# Patient Record
Sex: Female | Born: 1965 | Race: Black or African American | Hispanic: No | Marital: Married | Smoking: Current some day smoker
Health system: Southern US, Community
[De-identification: ages and names within clinical notes are randomized; demographics above are authoritative.]

## PROBLEM LIST (undated history)

## (undated) DIAGNOSIS — R000 Tachycardia, unspecified: Secondary | ICD-10-CM

## (undated) DIAGNOSIS — N6019 Diffuse cystic mastopathy of unspecified breast: Secondary | ICD-10-CM

## (undated) DIAGNOSIS — A599 Trichomoniasis, unspecified: Secondary | ICD-10-CM

## (undated) DIAGNOSIS — E119 Type 2 diabetes mellitus without complications: Secondary | ICD-10-CM

## (undated) DIAGNOSIS — IMO0002 Reserved for concepts with insufficient information to code with codable children: Secondary | ICD-10-CM

## (undated) DIAGNOSIS — G709 Myoneural disorder, unspecified: Secondary | ICD-10-CM

## (undated) DIAGNOSIS — R6882 Decreased libido: Secondary | ICD-10-CM

## (undated) DIAGNOSIS — G43909 Migraine, unspecified, not intractable, without status migrainosus: Secondary | ICD-10-CM

## (undated) DIAGNOSIS — F32A Depression, unspecified: Secondary | ICD-10-CM

## (undated) DIAGNOSIS — H3553 Other dystrophies primarily involving the sensory retina: Secondary | ICD-10-CM

## (undated) DIAGNOSIS — T7840XA Allergy, unspecified, initial encounter: Secondary | ICD-10-CM

## (undated) DIAGNOSIS — M797 Fibromyalgia: Secondary | ICD-10-CM

## (undated) DIAGNOSIS — J45909 Unspecified asthma, uncomplicated: Secondary | ICD-10-CM

## (undated) DIAGNOSIS — K219 Gastro-esophageal reflux disease without esophagitis: Secondary | ICD-10-CM

## (undated) DIAGNOSIS — M1990 Unspecified osteoarthritis, unspecified site: Secondary | ICD-10-CM

## (undated) DIAGNOSIS — F329 Major depressive disorder, single episode, unspecified: Secondary | ICD-10-CM

## (undated) DIAGNOSIS — D649 Anemia, unspecified: Secondary | ICD-10-CM

## (undated) DIAGNOSIS — I10 Essential (primary) hypertension: Secondary | ICD-10-CM

## (undated) DIAGNOSIS — N83209 Unspecified ovarian cyst, unspecified side: Secondary | ICD-10-CM

## (undated) DIAGNOSIS — E559 Vitamin D deficiency, unspecified: Secondary | ICD-10-CM

## (undated) DIAGNOSIS — N760 Acute vaginitis: Secondary | ICD-10-CM

## (undated) HISTORY — DX: Type 2 diabetes mellitus without complications: E11.9

## (undated) HISTORY — PX: TONSILLECTOMY AND ADENOIDECTOMY: SUR1326

## (undated) HISTORY — DX: Fibromyalgia: M79.7

## (undated) HISTORY — DX: Allergy, unspecified, initial encounter: T78.40XA

## (undated) HISTORY — DX: Acute vaginitis: N76.0

## (undated) HISTORY — DX: Diffuse cystic mastopathy of unspecified breast: N60.19

## (undated) HISTORY — DX: Tachycardia, unspecified: R00.0

## (undated) HISTORY — DX: Reserved for concepts with insufficient information to code with codable children: IMO0002

## (undated) HISTORY — DX: Gastro-esophageal reflux disease without esophagitis: K21.9

## (undated) HISTORY — DX: Migraine, unspecified, not intractable, without status migrainosus: G43.909

## (undated) HISTORY — PX: DILATION AND CURETTAGE OF UTERUS: SHX78

## (undated) HISTORY — PX: OTHER SURGICAL HISTORY: SHX169

## (undated) HISTORY — DX: Trichomoniasis, unspecified: A59.9

## (undated) HISTORY — PX: MYOMECTOMY: SHX85

## (undated) HISTORY — PX: BREAST EXCISIONAL BIOPSY: SUR124

## (undated) HISTORY — DX: Myoneural disorder, unspecified: G70.9

## (undated) HISTORY — DX: Decreased libido: R68.82

## (undated) HISTORY — DX: Essential (primary) hypertension: I10

## (undated) HISTORY — DX: Anemia, unspecified: D64.9

## (undated) HISTORY — DX: Vitamin D deficiency, unspecified: E55.9

## (undated) HISTORY — PX: WISDOM TOOTH EXTRACTION: SHX21

## (undated) HISTORY — DX: Unspecified ovarian cyst, unspecified side: N83.209

---

## 1984-02-13 DIAGNOSIS — H3553 Other dystrophies primarily involving the sensory retina: Secondary | ICD-10-CM

## 1984-02-13 HISTORY — PX: BREAST SURGERY: SHX581

## 1984-02-13 HISTORY — DX: Other dystrophies primarily involving the sensory retina: H35.53

## 1998-02-12 HISTORY — PX: TUBAL LIGATION: SHX77

## 1998-02-12 HISTORY — PX: OOPHORECTOMY: SHX86

## 2000-02-13 DIAGNOSIS — J45909 Unspecified asthma, uncomplicated: Secondary | ICD-10-CM

## 2000-02-13 HISTORY — DX: Unspecified asthma, uncomplicated: J45.909

## 2000-08-10 ENCOUNTER — Encounter: Payer: Self-pay | Admitting: Family Medicine

## 2000-08-10 ENCOUNTER — Ambulatory Visit (HOSPITAL_COMMUNITY): Admission: RE | Admit: 2000-08-10 | Discharge: 2000-08-10 | Payer: Self-pay

## 2000-08-25 ENCOUNTER — Emergency Department (HOSPITAL_COMMUNITY): Admission: EM | Admit: 2000-08-25 | Discharge: 2000-08-25 | Payer: Self-pay

## 2000-10-08 ENCOUNTER — Other Ambulatory Visit: Admission: RE | Admit: 2000-10-08 | Discharge: 2000-10-08 | Payer: Self-pay | Admitting: Obstetrics and Gynecology

## 2001-05-20 ENCOUNTER — Other Ambulatory Visit: Admission: RE | Admit: 2001-05-20 | Discharge: 2001-05-20 | Payer: Self-pay | Admitting: Obstetrics and Gynecology

## 2001-07-22 ENCOUNTER — Emergency Department (HOSPITAL_COMMUNITY): Admission: EM | Admit: 2001-07-22 | Discharge: 2001-07-22 | Payer: Self-pay | Admitting: Emergency Medicine

## 2001-07-23 ENCOUNTER — Emergency Department (HOSPITAL_COMMUNITY): Admission: EM | Admit: 2001-07-23 | Discharge: 2001-07-23 | Payer: Self-pay | Admitting: Emergency Medicine

## 2002-04-25 ENCOUNTER — Emergency Department (HOSPITAL_COMMUNITY): Admission: EM | Admit: 2002-04-25 | Discharge: 2002-04-25 | Payer: Self-pay

## 2002-04-28 ENCOUNTER — Encounter: Payer: Self-pay | Admitting: Emergency Medicine

## 2002-04-28 ENCOUNTER — Emergency Department (HOSPITAL_COMMUNITY): Admission: EM | Admit: 2002-04-28 | Discharge: 2002-04-28 | Payer: Self-pay | Admitting: Emergency Medicine

## 2002-12-16 ENCOUNTER — Other Ambulatory Visit: Admission: RE | Admit: 2002-12-16 | Discharge: 2002-12-16 | Payer: Self-pay | Admitting: Obstetrics and Gynecology

## 2002-12-17 ENCOUNTER — Encounter: Admission: RE | Admit: 2002-12-17 | Discharge: 2002-12-17 | Payer: Self-pay | Admitting: Obstetrics and Gynecology

## 2003-02-13 DIAGNOSIS — N6019 Diffuse cystic mastopathy of unspecified breast: Secondary | ICD-10-CM

## 2003-02-13 HISTORY — DX: Diffuse cystic mastopathy of unspecified breast: N60.19

## 2004-02-13 HISTORY — PX: SHOULDER ARTHROSCOPY W/ ROTATOR CUFF REPAIR: SHX2400

## 2004-07-21 ENCOUNTER — Emergency Department (HOSPITAL_COMMUNITY): Admission: EM | Admit: 2004-07-21 | Discharge: 2004-07-21 | Payer: Self-pay | Admitting: Family Medicine

## 2004-10-24 ENCOUNTER — Emergency Department (HOSPITAL_COMMUNITY): Admission: EM | Admit: 2004-10-24 | Discharge: 2004-10-24 | Payer: Self-pay | Admitting: Emergency Medicine

## 2005-02-12 DIAGNOSIS — IMO0002 Reserved for concepts with insufficient information to code with codable children: Secondary | ICD-10-CM

## 2005-02-12 DIAGNOSIS — R87612 Low grade squamous intraepithelial lesion on cytologic smear of cervix (LGSIL): Secondary | ICD-10-CM

## 2005-02-12 HISTORY — DX: Low grade squamous intraepithelial lesion on cytologic smear of cervix (LGSIL): R87.612

## 2005-02-12 HISTORY — DX: Reserved for concepts with insufficient information to code with codable children: IMO0002

## 2005-02-26 ENCOUNTER — Encounter (INDEPENDENT_AMBULATORY_CARE_PROVIDER_SITE_OTHER): Payer: Self-pay | Admitting: *Deleted

## 2005-02-26 ENCOUNTER — Encounter: Admission: RE | Admit: 2005-02-26 | Discharge: 2005-02-26 | Payer: Self-pay | Admitting: Obstetrics and Gynecology

## 2005-03-19 ENCOUNTER — Other Ambulatory Visit: Admission: RE | Admit: 2005-03-19 | Discharge: 2005-03-19 | Payer: Self-pay | Admitting: Obstetrics and Gynecology

## 2005-03-27 ENCOUNTER — Encounter: Admission: RE | Admit: 2005-03-27 | Discharge: 2005-03-27 | Payer: Self-pay | Admitting: Internal Medicine

## 2005-08-27 ENCOUNTER — Encounter: Admission: RE | Admit: 2005-08-27 | Discharge: 2005-08-27 | Payer: Self-pay | Admitting: Obstetrics and Gynecology

## 2005-11-10 ENCOUNTER — Emergency Department (HOSPITAL_COMMUNITY): Admission: EM | Admit: 2005-11-10 | Discharge: 2005-11-10 | Payer: Self-pay | Admitting: Family Medicine

## 2006-02-12 DIAGNOSIS — N760 Acute vaginitis: Secondary | ICD-10-CM

## 2006-02-12 HISTORY — DX: Acute vaginitis: N76.0

## 2006-02-27 ENCOUNTER — Encounter: Admission: RE | Admit: 2006-02-27 | Discharge: 2006-02-27 | Payer: Self-pay | Admitting: Obstetrics and Gynecology

## 2006-11-05 ENCOUNTER — Encounter: Admission: RE | Admit: 2006-11-05 | Discharge: 2006-11-05 | Payer: Self-pay | Admitting: Internal Medicine

## 2007-03-17 ENCOUNTER — Emergency Department (HOSPITAL_COMMUNITY): Admission: EM | Admit: 2007-03-17 | Discharge: 2007-03-17 | Payer: Self-pay | Admitting: Emergency Medicine

## 2007-06-20 ENCOUNTER — Encounter: Admission: RE | Admit: 2007-06-20 | Discharge: 2007-06-20 | Payer: Self-pay | Admitting: Obstetrics and Gynecology

## 2007-09-27 ENCOUNTER — Emergency Department (HOSPITAL_COMMUNITY): Admission: EM | Admit: 2007-09-27 | Discharge: 2007-09-27 | Payer: Self-pay | Admitting: Family Medicine

## 2008-02-13 DIAGNOSIS — R6882 Decreased libido: Secondary | ICD-10-CM

## 2008-02-13 HISTORY — DX: Decreased libido: R68.82

## 2009-01-04 ENCOUNTER — Encounter: Admission: RE | Admit: 2009-01-04 | Discharge: 2009-01-04 | Payer: Self-pay | Admitting: Obstetrics and Gynecology

## 2009-12-01 LAB — HM DIABETES EYE EXAM

## 2010-02-22 ENCOUNTER — Encounter
Admission: RE | Admit: 2010-02-22 | Discharge: 2010-02-22 | Payer: Self-pay | Source: Home / Self Care | Attending: Obstetrics and Gynecology | Admitting: Obstetrics and Gynecology

## 2010-03-15 ENCOUNTER — Encounter: Payer: Self-pay | Admitting: Obstetrics and Gynecology

## 2010-08-03 ENCOUNTER — Other Ambulatory Visit: Payer: Self-pay | Admitting: Obstetrics and Gynecology

## 2010-08-03 DIAGNOSIS — N6310 Unspecified lump in the right breast, unspecified quadrant: Secondary | ICD-10-CM

## 2010-08-03 DIAGNOSIS — N6320 Unspecified lump in the left breast, unspecified quadrant: Secondary | ICD-10-CM

## 2010-08-10 ENCOUNTER — Ambulatory Visit
Admission: RE | Admit: 2010-08-10 | Discharge: 2010-08-10 | Disposition: A | Discharge status: Home / Self Care | Payer: Medicare Other | Source: Ambulatory Visit | Attending: Obstetrics and Gynecology | Admitting: Obstetrics and Gynecology

## 2010-08-10 ENCOUNTER — Other Ambulatory Visit: Payer: Self-pay | Admitting: Obstetrics and Gynecology

## 2010-08-10 DIAGNOSIS — N6310 Unspecified lump in the right breast, unspecified quadrant: Secondary | ICD-10-CM

## 2010-08-10 DIAGNOSIS — N6320 Unspecified lump in the left breast, unspecified quadrant: Secondary | ICD-10-CM

## 2010-09-14 ENCOUNTER — Other Ambulatory Visit: Payer: Self-pay | Admitting: Obstetrics and Gynecology

## 2010-09-15 ENCOUNTER — Encounter (HOSPITAL_COMMUNITY): Payer: Self-pay | Admitting: *Deleted

## 2010-09-20 NOTE — H&P (Signed)
NAMEMarland Hardin  ARNELLE, NALE NO.:  [id number]  MEDICAL RECORD NO.:  [id number]  LOCATION:  PERIO                         FACILITY:  [facility]  PHYSICIAN:  Janine Limbo, M.D.DATE OF BIRTH:  January 23, 1966  DATE OF ADMISSION:  09/21/2010 DATE OF DISCHARGE:                             HISTORY & PHYSICAL   HISTORY OF PRESENT ILLNESS:  Ms. Karen Hardin is a 45 year old female, para 3-0-0-3, who presents for hysteroscopy with resection of an endometrial polyp.  She will also have a dilatation and curettage.  The patient has been followed at the [facility].  The patient has a known history of fibroids.  She also has menorrhagia and endometrial polyps.  A hydrosonogram was performed that showed a least 7 fibroids with the largest measuring 3.39 cm.  She was also found to have a submucosal mass measuring 2.57 cm.  This was felt to be most consistent with endometrial polyp.  Her endometrial biopsy was negative.  The patient has had a diagnostic laparoscopy in the past which showed endometriosis.  She has had a bilateral tubal ligation.  PAST MEDICAL HISTORY:  The patient has had a breast cyst removed.  She has had laparoscopy.  She has had ganglion cyst removed from her wrist. She has had surgery on her eye.  She had a myomectomy in [place].  DRUG ALLERGIES:  No known drug allergies.  SOCIAL HISTORY:  The patient smokes cigarettes.  OBSTETRICAL HISTORY:  The patient has had 3 term vaginal deliveries.  REVIEW OF SYSTEMS:  See history of present illness.  FAMILY HISTORY:  Noncontributory.  PHYSICAL EXAMINATION:  VITAL SIGNS:  Weight is 163 pounds. HEENT:  Within normal limits. CHEST:  Clear. HEART: Regular rate and rhythm. BREASTS:  Without masses. ABDOMEN:  Nontender. EXTREMITIES:  Grossly normal. NEUROLOGIC:  Grossly normal. PELVIC:  External genitalia is normal.  Vagina is normal.  Cervix  is nontender.  Uterus is 12-week size.  Adnexa, no masses and rectovaginal exam confirms.  ASSESSMENT: 1. Irregular uterine bleeding. 2. Fibroids. 3. Endometrial polyps. 4. Prior myomectomy.  PLAN:  The patient will undergo hysteroscopy with resection of the endometrial polyp.  She will then have a dilatation and curettage.  The patient understands the indications for her surgical procedure and she accepts the risks of, but not limited to, anesthetic complications, bleeding,infections, and possible damage to the surrounding organs.  Janine Limbo, M.D.     AVS/MEDQ  D:  09/20/2010  T:  09/20/2010  Job:  [id number]

## 2010-09-21 ENCOUNTER — Encounter (HOSPITAL_COMMUNITY): Payer: Self-pay | Admitting: Anesthesiology

## 2010-09-21 ENCOUNTER — Encounter (HOSPITAL_COMMUNITY)
Admission: RE | Disposition: A | Discharge status: Home / Self Care | Payer: Self-pay | Source: Ambulatory Visit | Attending: Obstetrics and Gynecology

## 2010-09-21 ENCOUNTER — Ambulatory Visit (HOSPITAL_COMMUNITY)
Admission: RE | Admit: 2010-09-21 | Discharge: 2010-09-21 | Disposition: A | Discharge status: Home / Self Care | Payer: Medicare Other | Source: Ambulatory Visit | Attending: Obstetrics and Gynecology | Admitting: Obstetrics and Gynecology

## 2010-09-21 ENCOUNTER — Ambulatory Visit (HOSPITAL_COMMUNITY): Payer: Medicare Other | Admitting: Anesthesiology

## 2010-09-21 ENCOUNTER — Other Ambulatory Visit: Payer: Self-pay | Admitting: Obstetrics and Gynecology

## 2010-09-21 ENCOUNTER — Encounter (HOSPITAL_COMMUNITY): Payer: Self-pay | Admitting: Obstetrics and Gynecology

## 2010-09-21 DIAGNOSIS — N840 Polyp of corpus uteri: Secondary | ICD-10-CM | POA: Insufficient documentation

## 2010-09-21 DIAGNOSIS — N920 Excessive and frequent menstruation with regular cycle: Secondary | ICD-10-CM | POA: Insufficient documentation

## 2010-09-21 DIAGNOSIS — D250 Submucous leiomyoma of uterus: Secondary | ICD-10-CM | POA: Insufficient documentation

## 2010-09-21 DIAGNOSIS — Z8742 Personal history of other diseases of the female genital tract: Secondary | ICD-10-CM

## 2010-09-21 HISTORY — DX: Depression, unspecified: F32.A

## 2010-09-21 HISTORY — DX: Unspecified osteoarthritis, unspecified site: M19.90

## 2010-09-21 HISTORY — DX: Major depressive disorder, single episode, unspecified: F32.9

## 2010-09-21 LAB — CBC
HCT: 38.3 % (ref 36.0–46.0)
Hemoglobin: 12.3 g/dL (ref 12.0–15.0)
MCHC: 32.1 g/dL (ref 30.0–36.0)
MCV: 86.8 fL (ref 78.0–100.0)
RDW: 14.6 % (ref 11.5–15.5)
WBC: 6.7 10*3/uL (ref 4.0–10.5)

## 2010-09-21 SURGERY — DILATATION & CURETTAGE/HYSTEROSCOPY WITH RESECTOCOPE
Anesthesia: General | Site: Uterus | Wound class: Clean Contaminated

## 2010-09-21 MED ORDER — DEXAMETHASONE SODIUM PHOSPHATE 10 MG/ML IJ SOLN
INTRAMUSCULAR | Status: AC
  Filled 2010-09-21: qty 1

## 2010-09-21 MED ORDER — BUPIVACAINE-EPINEPHRINE 0.5% -1:200000 IJ SOLN
INTRAMUSCULAR | Status: DC | PRN
  Administered 2010-09-21: 10 mL

## 2010-09-21 MED ORDER — PROPOFOL 10 MG/ML IV EMUL
INTRAVENOUS | Status: DC | PRN
  Administered 2010-09-21: 150 mg via INTRAVENOUS

## 2010-09-21 MED ORDER — KETOROLAC TROMETHAMINE 30 MG/ML IJ SOLN: 15.0000 mg | Freq: Once | INTRAMUSCULAR | Status: DC | PRN

## 2010-09-21 MED ORDER — OXYCODONE-ACETAMINOPHEN 5-325 MG PO TABS: 1.0000 | ORAL_TABLET | ORAL | Status: AC | PRN

## 2010-09-21 MED ORDER — LACTATED RINGERS IV SOLN
INTRAVENOUS | Status: DC
  Administered 2010-09-21 (×4): via INTRAVENOUS

## 2010-09-21 MED ORDER — FENTANYL CITRATE 0.05 MG/ML IJ SOLN
25.0000 ug | INTRAMUSCULAR | Status: DC | PRN
  Administered 2010-09-21: 50 ug via INTRAVENOUS

## 2010-09-21 MED ORDER — FAMOTIDINE 20 MG PO TABS: 20.0000 mg | ORAL_TABLET | Freq: Once | ORAL | Status: DC | PRN

## 2010-09-21 MED ORDER — PROMETHAZINE HCL 12.5 MG PO TABS: 12.5000 mg | ORAL_TABLET | Freq: Four times a day (QID) | ORAL | Status: AC | PRN

## 2010-09-21 MED ORDER — FENTANYL CITRATE 0.05 MG/ML IJ SOLN
INTRAMUSCULAR | Status: DC | PRN
  Administered 2010-09-21 (×5): 50 ug via INTRAVENOUS

## 2010-09-21 MED ORDER — MIDAZOLAM HCL 2 MG/2ML IJ SOLN
INTRAMUSCULAR | Status: AC
  Filled 2010-09-21: qty 2

## 2010-09-21 MED ORDER — PANTOPRAZOLE SODIUM 40 MG PO TBEC: 40.0000 mg | DELAYED_RELEASE_TABLET | Freq: Once | ORAL | Status: DC | PRN

## 2010-09-21 MED ORDER — KETOROLAC TROMETHAMINE 60 MG/2ML IM SOLN
INTRAMUSCULAR | Status: AC
  Filled 2010-09-21: qty 2

## 2010-09-21 MED ORDER — ACETAMINOPHEN 325 MG PO TABS: 325.0000 mg | ORAL_TABLET | ORAL | Status: DC | PRN

## 2010-09-21 MED ORDER — PHENYLEPHRINE HCL 10 MG/ML IJ SOLN
INTRAMUSCULAR | Status: DC | PRN
  Administered 2010-09-21: 80 ug via INTRAVENOUS

## 2010-09-21 MED ORDER — DEXAMETHASONE SODIUM PHOSPHATE 4 MG/ML IJ SOLN
INTRAMUSCULAR | Status: DC | PRN
  Administered 2010-09-21: 4 mg via INTRAVENOUS

## 2010-09-21 MED ORDER — PROMETHAZINE HCL 25 MG/ML IJ SOLN: 6.2500 mg | INTRAMUSCULAR | Status: DC | PRN

## 2010-09-21 MED ORDER — PROMETHAZINE HCL 12.5 MG PO TABS: 12.5000 mg | ORAL_TABLET | Freq: Four times a day (QID) | ORAL | Status: DC | PRN

## 2010-09-21 MED ORDER — LIDOCAINE HCL (CARDIAC) 20 MG/ML IV SOLN
INTRAVENOUS | Status: AC
  Filled 2010-09-21: qty 5

## 2010-09-21 MED ORDER — MIDAZOLAM HCL 5 MG/5ML IJ SOLN
INTRAMUSCULAR | Status: DC | PRN
  Administered 2010-09-21: 2 mg via INTRAVENOUS

## 2010-09-21 MED ORDER — ONDANSETRON HCL 4 MG/2ML IJ SOLN
INTRAMUSCULAR | Status: AC
  Filled 2010-09-21: qty 2

## 2010-09-21 MED ORDER — LIDOCAINE HCL (CARDIAC) 20 MG/ML IV SOLN
INTRAVENOUS | Status: DC | PRN
  Administered 2010-09-21: 80 mg via INTRAVENOUS

## 2010-09-21 MED ORDER — GLYCINE 1.5 % IR SOLN
Status: DC | PRN
  Administered 2010-09-21: 3000 mL

## 2010-09-21 MED ORDER — FENTANYL CITRATE 0.05 MG/ML IJ SOLN
INTRAMUSCULAR | Status: AC
  Filled 2010-09-21: qty 5

## 2010-09-21 MED ORDER — PROPOFOL 10 MG/ML IV EMUL
INTRAVENOUS | Status: AC
  Filled 2010-09-21: qty 20

## 2010-09-21 MED ORDER — IBUPROFEN 200 MG PO TABS: 800.0000 mg | ORAL_TABLET | Freq: Three times a day (TID) | ORAL | Status: AC | PRN

## 2010-09-21 MED ORDER — FENTANYL CITRATE 0.05 MG/ML IJ SOLN
INTRAMUSCULAR | Status: AC
  Filled 2010-09-21: qty 2

## 2010-09-21 MED ORDER — KETOROLAC TROMETHAMINE 30 MG/ML IJ SOLN
INTRAMUSCULAR | Status: DC | PRN
  Administered 2010-09-21: 60 mg via INTRAVENOUS

## 2010-09-21 MED ORDER — SCOPOLAMINE 1 MG/3DAYS TD PT72: 1.0000 | MEDICATED_PATCH | Freq: Once | TRANSDERMAL | Status: DC | PRN

## 2010-09-21 MED ORDER — CITRIC ACID-SODIUM CITRATE 334-500 MG/5ML PO SOLN: 30.0000 mL | Freq: Once | ORAL | Status: DC | PRN

## 2010-09-21 MED ORDER — METOCLOPRAMIDE HCL 10 MG PO TABS: 10.0000 mg | ORAL_TABLET | Freq: Once | ORAL | Status: DC | PRN

## 2010-09-21 MED ORDER — ONDANSETRON HCL 4 MG/2ML IJ SOLN
INTRAMUSCULAR | Status: DC | PRN
  Administered 2010-09-21: 4 mg via INTRAVENOUS

## 2010-09-21 SURGICAL SUPPLY — 18 items
CANISTER SUCTION 2500CC (MISCELLANEOUS) ×2 IMPLANT
CATH ROBINSON RED A/P 16FR (CATHETERS) ×2 IMPLANT
CLOTH BEACON ORANGE TIMEOUT ST (SAFETY) ×2 IMPLANT
CONTAINER PREFILL 10% NBF 60ML (FORM) ×5 IMPLANT
DRAPE UTILITY XL STRL (DRAPES) ×2 IMPLANT
ELECT REM PT RETURN 9FT ADLT (ELECTROSURGICAL) ×2
ELECTRODE REM PT RTRN 9FT ADLT (ELECTROSURGICAL) IMPLANT
ELECTRODE ROLLER BARREL 22FR (ELECTROSURGICAL) IMPLANT
ELECTRODE VAPORCUT 22FR (ELECTROSURGICAL) IMPLANT
GLOVE BIOGEL PI IND STRL 8.5 (GLOVE) ×1 IMPLANT
GLOVE BIOGEL PI INDICATOR 8.5 (GLOVE) ×1
GLOVE ECLIPSE 8.0 STRL XLNG CF (GLOVE) ×4 IMPLANT
GOWN PREVENTION PLUS LG XLONG (DISPOSABLE) ×2 IMPLANT
GOWN PREVENTION PLUS XXLARGE (GOWN DISPOSABLE) ×2 IMPLANT
LOOP ANGLED CUTTING 22FR (CUTTING LOOP) ×1 IMPLANT
PACK HYSTEROSCOPY LF (CUSTOM PROCEDURE TRAY) ×2 IMPLANT
TOWEL OR 17X24 6PK STRL BLUE (TOWEL DISPOSABLE) ×4 IMPLANT
WATER STERILE IRR 1000ML POUR (IV SOLUTION) ×1 IMPLANT

## 2010-09-21 NOTE — H&P (Signed)
Patient examined.  Surgery reviewed. Questions answered. Ready to proceed.

## 2010-09-21 NOTE — Anesthesia Preprocedure Evaluation (Signed)
Anesthesia Evaluation  Name, MR# and DOB Patient awake  General Assessment Comment  Reviewed: Allergy & Precautions, H&P , Patient's Chart, lab work & pertinent test results, reviewed documented beta blocker date and time   History of Anesthesia Complications (+) PONV and AWARENESS UNDER ANESTHESIA  Airway Mallampati: II TM Distance: >3 FB Neck ROM: full    Dental No notable dental hx.    Pulmonary  clear to auscultation  pulmonary exam normalPulmonary Exam Normal breath sounds clear to auscultation none    Cardiovascular Exercise Tolerance: Good regular Normal    Neuro/Psych Negative Neurological ROS  Negative Psych ROS  GI/Hepatic/Renal negative GI ROS, negative Liver ROS, and negative Renal ROS (+)       Endo/Other  Negative Endocrine ROS (+)      Abdominal   Musculoskeletal   Hematology negative hematology ROS (+)   Peds  Reproductive/Obstetrics negative OB ROS    Anesthesia Other Findings             Anesthesia Physical Anesthesia Plan  ASA: I  Anesthesia Plan: General   Post-op Pain Management:    Induction:   Airway Management Planned:   Additional Equipment:   Intra-op Plan:   Post-operative Plan:   Informed Consent: I have reviewed the patients History and Physical, chart, labs and discussed the procedure including the risks, benefits and alternatives for the proposed anesthesia with the patient or authorized representative who has indicated his/her understanding and acceptance.   Dental Advisory Given  Plan Discussed with: CRNA and Surgeon  Anesthesia Plan Comments:         Anesthesia Quick Evaluation

## 2010-09-21 NOTE — Anesthesia Postprocedure Evaluation (Signed)
Anesthesia Post Note  Patient: Karen Hardin  Procedure(s) Performed:  DILATATION & CURETTAGE/HYSTEROSCOPY WITH RESECTOSCOPE  Anesthesia type: GA  Patient location: PACU  Post pain: Pain level controlled  Post assessment: Post-op Vital signs reviewed  Last Vitals:  Filed Vitals:   09/21/10 1130  BP: 113/72  Pulse: 83  Temp:   Resp: 16    Post vital signs: Reviewed  Level of consciousness: sedated  Complications: No apparent anesthesia complications

## 2010-09-21 NOTE — Transfer of Care (Signed)
Immediate Anesthesia Transfer of Care Note  Patient: Karen Hardin  Procedure(s) Performed:  DILATATION & CURETTAGE/HYSTEROSCOPY WITH RESECTOSCOPE  Patient Location: PACU  Anesthesia Type: General  Level of Consciousness: awake, alert , oriented and patient cooperative  Airway & Oxygen Therapy: Patient Spontanous Breathing and Patient connected to nasal cannula oxygen  Post-op Assessment: Report given to PACU RN, Post -op Vital signs reviewed and stable, Patient moving all extremities, Patient moving all extremities X 4 and Patient able to stick tongue midline  Post vital signs: Reviewed and stable  Complications: No apparent anesthesia complications

## 2010-09-21 NOTE — Preoperative (Signed)
Beta Blockers   Reason not to administer Beta Blockers:Not Applicable 

## 2010-09-21 NOTE — Op Note (Signed)
Note of operation  Karen Hardin  Medical records number: [id number]  Date of operation: 09/21/2010  Preoperative diagnosis:  Menorrhagia  Fibroids  Endometrial polyps  Postoperative diagnosis:  Menorrhagia  Fibroids  Endometrial polyps  Procedure:  Hysteroscopy  Resection of polyps  Dilatation and curettage  Surgeon:  Leonard Schwartz M.D.  First assistant:  None  Anesthesia:  General  Disposition:  This platelet is a 45 year old female, para 3-0-0-3, who presents with the above-mentioned diagnosis. The patient understands the indications for her procedure. She accepts the risk of, but not limited to, anesthetic complications, bleeding, infections, and possible damage to the surrounding organs.  Findings:  The uterus is 12 week size. No adnexal masses are appreciated. On hysteroscopy the patient was noted to have several endometrial polyps with the largest polyp measuring 1 cm. She was also noted to have a submucosal fibroid. No other pathology was appreciated.  Procedure:  The patient was taken to the operating room where a general anesthetic was given. The patient's perineum was prepped with multiple layers of Betadine. The bladder was drained of urine. The patient was sterilely draped. Examination under anesthesia was performed. A paracervical block was placed using Marcaine with epinephrine. An endocervical curettage was performed. The uterus sounded to 10 cm. The diagnostic hysteroscope was inserted. The cavity was carefully inspected. Pictures were taken. The cervix was gently dilated. The operative hysteroscope was then inserted. The interatrial polyp and a submucosal fibroids were resected without difficulty. Hemostasis was adequate. The cavity was then curetted until to be completely clean. The hysteroscope was reinserted. It was confirmed that the cavity was cleaned. On the sutures were removed. The patient's exam was repeated. The uterus was  noted to be firm. Hemostasis was adequate. The patient was returned to the supine position. She was awakened from her anesthetic without difficulty. She was transported to the recovery room in stable condition. Estimated fluid deficit was 30 cc. The estimated blood loss was 5 cc. The patient tolerated her procedure well. The endometrial curettings, endometrial resections, and the endocervical curettings were sent to pathology for evaluation.  Followup instructions:  The patient will return to see Dr. Stefano Gaul in 2 weeks for followup examination. She was given a copy of the postoperative instructions as prepared by the [facility] in [place]. She will refrain from intercourse for 2 weeks. She will call for questions or concerns.  Discharge medications:  Percocet 5/325 one or 2 tablets every 4 hours as needed for severe pain  Motrin 800 mg one tablet every 8 hours as needed for mild to moderate pain  Phenergan 12.5 mg one tablet every 6 hours as needed for nausea

## 2011-06-01 ENCOUNTER — Other Ambulatory Visit: Payer: Self-pay | Admitting: Family Medicine

## 2011-06-01 ENCOUNTER — Ambulatory Visit
Admission: RE | Admit: 2011-06-01 | Discharge: 2011-06-01 | Disposition: A | Discharge status: Home / Self Care | Payer: Medicare Other | Source: Ambulatory Visit | Attending: Family Medicine | Admitting: Family Medicine

## 2011-06-01 DIAGNOSIS — R7611 Nonspecific reaction to tuberculin skin test without active tuberculosis: Secondary | ICD-10-CM

## 2011-06-01 DIAGNOSIS — R52 Pain, unspecified: Secondary | ICD-10-CM

## 2011-08-03 ENCOUNTER — Telehealth: Payer: Self-pay | Admitting: Obstetrics and Gynecology

## 2011-08-06 ENCOUNTER — Telehealth: Payer: Self-pay

## 2011-08-06 NOTE — Telephone Encounter (Signed)
Triage/cht received 

## 2011-08-06 NOTE — Telephone Encounter (Signed)
Pt requests referral for diagnostic mammogram and is due for annual appointment. Scheduled aex with Dr Stefano Gaul for 08/22/11. Pt voiced understanding.

## 2011-08-22 ENCOUNTER — Ambulatory Visit: Payer: Self-pay | Admitting: Obstetrics and Gynecology

## 2011-09-11 ENCOUNTER — Encounter: Payer: Self-pay | Admitting: Obstetrics and Gynecology

## 2011-09-11 ENCOUNTER — Ambulatory Visit (INDEPENDENT_AMBULATORY_CARE_PROVIDER_SITE_OTHER): Payer: Medicare Other | Admitting: Obstetrics and Gynecology

## 2011-09-11 ENCOUNTER — Telehealth: Payer: Self-pay | Admitting: Obstetrics and Gynecology

## 2011-09-11 VITALS — BP 106/62 | Ht 68.0 in | Wt 170.0 lb

## 2011-09-11 DIAGNOSIS — N6019 Diffuse cystic mastopathy of unspecified breast: Secondary | ICD-10-CM

## 2011-09-11 DIAGNOSIS — D219 Benign neoplasm of connective and other soft tissue, unspecified: Secondary | ICD-10-CM

## 2011-09-11 DIAGNOSIS — Z01419 Encounter for gynecological examination (general) (routine) without abnormal findings: Secondary | ICD-10-CM

## 2011-09-11 DIAGNOSIS — D259 Leiomyoma of uterus, unspecified: Secondary | ICD-10-CM

## 2011-09-11 DIAGNOSIS — N920 Excessive and frequent menstruation with regular cycle: Secondary | ICD-10-CM

## 2011-09-11 DIAGNOSIS — Z124 Encounter for screening for malignant neoplasm of cervix: Secondary | ICD-10-CM

## 2011-09-11 NOTE — Progress Notes (Signed)
Last Pap:08/03/2010 WNL: Yes Regular Periods:no Contraception: BTL  Monthly Breast exam:yes Tetanus<87yrs:yes Nl.Bladder Function:yes Daily BMs:no Healthy Diet:no Calcium:yes Mammogram:yes Date of Mammogram: 08/10/2010 Exercise:no Have often Exercise:  Seatbelt: yes Abuse at home: no Stressful work:no Sigmoid-colonoscopy: none Bone Density: No PCP: Dr.Sanders  Change in PMH: none   Change in HQI:ONGE   Subjective:    Karen Hardin is a 46 y.o. female, G3P3003, who presents for an annual exam. See above. The patient has a known history of fibroids.  She complains of heavy bleeding.  In 2012 a hysteroscopy and D&C helped her bleeding for a short time.  The path report was benign.  The patient has had a prior myomectomy.  She has a past history of endometriosis.  She has had 1 ovary removed.  Prior Hysterectomy: No    History   Social History  . Marital Status: Married    Spouse Name: N/A    Number of Children: N/A  . Years of Education: N/A   Social History Main Topics  . Smoking status: Current Everyday Smoker -- 0.2 packs/day for 20 years    Types: Cigarettes  . Smokeless tobacco: Never Used  . Alcohol Use: Yes     very rarely - less than once a week  . Drug Use: No  . Sexually Active: Yes    Birth Control/ Protection: Surgical, Condom   Other Topics Concern  . None   Social History Narrative  . None    Menstrual cycle:   LMP: Patient's last menstrual period was 08/30/2011.           Cycle: heavy  The following portions of the patient's history were reviewed and updated as appropriate: allergies, current medications, past family history, past medical history, past social history, past surgical history and problem list.  Review of Systems Pertinent items are noted in HPI. Breast:Negative for breast lump,nipple discharge or nipple retraction Gastrointestinal: Negative for abdominal pain, change in bowel habits or rectal bleeding Urinary:negative     Objective:    BP 106/62  Ht 5\' 8"  (1.727 m)  Wt 170 lb (77.111 kg)  BMI 25.85 kg/m2  LMP 08/30/2011    Weight:  Wt Readings from Last 1 Encounters:  09/11/11 170 lb (77.111 kg)          BMI: Body mass index is 25.85 kg/(m^2).  General Appearance: Alert, appropriate appearance for age. No acute distress HEENT: Grossly normal Neck / Thyroid: Supple, no masses, nodes or enlargement Lungs: clear to auscultation bilaterally Back: No CVA tenderness Breast Exam: the patient has fibrocystic breast changes and there is a 1 cm cyst present in the right outer mid quadrant Cardiovascular: Regular rate and rhythm. S1, S2, no murmur Gastrointestinal: Soft, non-tender, no masses or organomegaly  ++++++++++++++++++++++++++++++++++++++++++++++++++++++++  Pelvic Exam: External genitalia: normal general appearance Vaginal: normal without tenderness, induration or masses and relaxation noted Cervix: normal appearance Adnexa: normal bimanual exam Uterus: 12 week size and irregular Rectovaginal: normal rectal, no masses  ++++++++++++++++++++++++++++++++++++++++++++++++++++++++  Lymphatic Exam: Non-palpable nodes in neck, clavicular, axillary, or inguinal regions Neurologic: Normal speech, no tremor  Psychiatric: Alert and oriented, appropriate affect.   Wet Prep:not applicable Urinalysis:not applicable UPT: Not done   Assessment:    Normal gyn exam   Fibroids  Menorrhagia  Right breast lesion  History of endometriosis  Status post myomectomy  Status post oophorectomy  Overweight or obese: Yes   Pelvic relaxation: Yes   Plan:    mammogram pap smear return annually or prn  Contraception:bilateral tubal ligation    STD screen request: none  RPR: No.   HBsAg: No.  Hepatitis C: No.  The updated Pap smear screening guidelines were discussed with the patient. The patient requested that I obtain a Pap smear: Yes.  Kegel exercises discussed: Yes.  Proper diet and  regular exercise were reviewed.  Annual mammograms recommended starting at age 38. Proper breast care was discussed.  Screening colonoscopy is recommended beginning at age 87.  Regular health maintenance was reviewed.  We discussed the management of menorrhagia and fibroids.  The patient was to proceed with hysterectomy.  We will schedule further discussion and a potential LAVH.  Mylinda Latina.D.

## 2011-09-12 LAB — PAP IG W/ RFLX HPV ASCU

## 2011-09-18 ENCOUNTER — Telehealth: Payer: Self-pay | Admitting: Obstetrics and Gynecology

## 2011-09-25 ENCOUNTER — Other Ambulatory Visit: Payer: Self-pay | Admitting: Obstetrics and Gynecology

## 2011-09-25 ENCOUNTER — Ambulatory Visit
Admission: RE | Admit: 2011-09-25 | Discharge: 2011-09-25 | Disposition: A | Discharge status: Home / Self Care | Payer: Medicare Other | Source: Ambulatory Visit | Attending: Obstetrics and Gynecology | Admitting: Obstetrics and Gynecology

## 2011-09-25 DIAGNOSIS — N6019 Diffuse cystic mastopathy of unspecified breast: Secondary | ICD-10-CM

## 2011-12-18 ENCOUNTER — Emergency Department (INDEPENDENT_AMBULATORY_CARE_PROVIDER_SITE_OTHER)
Admission: EM | Admit: 2011-12-18 | Discharge: 2011-12-18 | Disposition: A | Discharge status: Home / Self Care | Payer: Medicare Other | Source: Home / Self Care | Attending: Emergency Medicine | Admitting: Emergency Medicine

## 2011-12-18 ENCOUNTER — Encounter (HOSPITAL_COMMUNITY): Payer: Self-pay | Admitting: *Deleted

## 2011-12-18 DIAGNOSIS — IMO0002 Reserved for concepts with insufficient information to code with codable children: Secondary | ICD-10-CM

## 2011-12-18 DIAGNOSIS — M5416 Radiculopathy, lumbar region: Secondary | ICD-10-CM

## 2011-12-18 DIAGNOSIS — H3553 Other dystrophies primarily involving the sensory retina: Secondary | ICD-10-CM | POA: Insufficient documentation

## 2011-12-18 HISTORY — DX: Other dystrophies primarily involving the sensory retina: H35.53

## 2011-12-18 MED ORDER — HYDROCODONE-ACETAMINOPHEN 5-325 MG PO TABS: ORAL_TABLET | ORAL | Status: DC

## 2011-12-18 MED ORDER — TIZANIDINE HCL 4 MG PO TABS: 4.0000 mg | ORAL_TABLET | Freq: Four times a day (QID) | ORAL | Status: DC | PRN

## 2011-12-18 MED ORDER — KETOROLAC TROMETHAMINE 60 MG/2ML IM SOLN
60.0000 mg | Freq: Once | INTRAMUSCULAR | Status: AC
  Administered 2011-12-18: 60 mg via INTRAMUSCULAR

## 2011-12-18 MED ORDER — KETOROLAC TROMETHAMINE 60 MG/2ML IM SOLN
INTRAMUSCULAR | Status: AC
  Filled 2011-12-18: qty 2

## 2011-12-18 MED ORDER — PREDNISONE 10 MG PO TABS: ORAL_TABLET | ORAL | Status: DC

## 2011-12-18 MED ORDER — HYDROCODONE-ACETAMINOPHEN 5-325 MG PO TABS
1.0000 | ORAL_TABLET | Freq: Once | ORAL | Status: AC
  Administered 2011-12-18: 1 via ORAL

## 2011-12-18 MED ORDER — HYDROCODONE-ACETAMINOPHEN 5-325 MG PO TABS
ORAL_TABLET | ORAL | Status: AC
  Filled 2011-12-18: qty 1

## 2011-12-18 NOTE — ED Notes (Signed)
States she bent down to pick up a bottle and could not straighten up this afternoon.  She applied the heating pad and states the pain got worse and went into L groin.  States she gets a sharp pain the occasionally goes down the L leg to her foot.

## 2011-12-18 NOTE — ED Provider Notes (Signed)
Chief Complaint  Patient presents with  . Back Pain    History of Present Illness:   The patient is a 46 year old female who presents with a one-day history of lower back pain. This afternoon she bent over to pickup a bottle and she could not straighten up. She experienced sudden onset of severe lower back pain which radiated into the left buttock, left groin, and on down the left leg into the toes. She noticed some numbness and tingling in the foot but no muscle weakness. The pain is described as a constant severe ache rated 8/10 in intensity punctuated by sharp stabbing pain is rated 10 over 10 in intensity. The pain is worse with any kind of movement and not better with any other activities. She tried Tylenol and cyclobenzaprine without relief. She denies any muscle weakness, bladder symptoms, incontinence of urine or stool, or saddle anesthesia. She's had no abdominal pain, fever, chills, or unintended weight loss. She had something similar years ago which was diagnosed as a back strain. She never had any x-rays and this got better on its own.  Review of Systems:  Other than noted above, the patient denies any of the following symptoms: Systemic:  No fever, chills, severe fatigue, or unexplained weight loss. GI:  No abdominal pain, nausea, vomiting, diarrhea, constipation, incontinence of bowel, or blood in stool. GU:  No dysuria, frequency, urgency, or hematuria. No incontinence of urine or difficulty urinating.  M-S:  No neck pain, joint pain, arthritis, or myalgias. Neuro:  No paresthesias, saddle anesthesia, muscular weakness, or progressive neurological deficit.  PMFSH:  Past medical history, family history, social history, meds, and allergies were reviewed. Specifically, there is no history of cancer, major trauma, osteoporosis, immunosuppression, HIV, or IV or injection drug use.   Physical Exam:   Vital signs:  BP 145/81  Pulse 111  Temp 98.3 F (36.8 C) (Oral)  Resp 20  SpO2 99%   LMP 11/27/2011 General:  Alert, oriented, and she appears uncomfortable due to pain. Abdomen:  Soft, non-tender.  No organomegaly or mass.  No pulsatile midline abdominal mass or bruit. Back:  There is tenderness to palpation in the left lumbar paravertebral musculature. There was no tenderness over the midline or over the sacroiliac joint. The back has a limited range of motion with 10 of flexion, 10 of extension, 10 of lateral bending in each direction, and 60 of rotation with pain and muscle spasm in straight leg raising was positive on the left with a positive Lasegue's sign but negative popliteal compression. Neuro:  Normal muscle strength, sensations and DTRs. Extremities: Pedal pulses were full, there was no edema. Skin:  Clear, warm and dry.  No rash.  Course in Urgent Care Center:   The patient was given Toradol 60 mg IM and hydrocodone 5/325 by mouth and tolerated these well without any immediate side effects.  Assessment:  The encounter diagnosis was Lumbar radiculopathy.  Plan:   1.  The following meds were prescribed:   New Prescriptions   HYDROCODONE-ACETAMINOPHEN (NORCO/VICODIN) 5-325 MG PER TABLET    1 to 2 tabs every 4 to 6 hours as needed for pain.   PREDNISONE (DELTASONE) 10 MG TABLET    Take 4 tabs daily for 4 days, 3 tabs daily for 4 days, 2 tabs daily for 4 days, then 1 tab daily for 4 days.   TIZANIDINE (ZANAFLEX) 4 MG TABLET    Take 1 tablet (4 mg total) by mouth every 6 (six) hours as needed.  2.  The patient was instructed in symptomatic care and handouts were given. 3.  The patient was told to return if becoming worse in any way, if no better in 2 weeks, and given some red flag symptoms that would indicate earlier return. 4.  The patient was encouraged to try to be as active as possible and given some exercises to do followed by moist heat.  Follow up:  The patient was told to follow up with her primary care physician in 2 weeks.     Reuben Likes,  MD 12/18/11 [phone]

## 2012-02-01 ENCOUNTER — Encounter (HOSPITAL_COMMUNITY): Payer: Self-pay | Admitting: Emergency Medicine

## 2012-02-01 ENCOUNTER — Emergency Department (INDEPENDENT_AMBULATORY_CARE_PROVIDER_SITE_OTHER)
Admission: EM | Admit: 2012-02-01 | Discharge: 2012-02-01 | Disposition: A | Discharge status: Home / Self Care | Payer: Medicare Other | Source: Home / Self Care

## 2012-02-01 ENCOUNTER — Emergency Department (INDEPENDENT_AMBULATORY_CARE_PROVIDER_SITE_OTHER): Payer: Medicare Other

## 2012-02-01 DIAGNOSIS — R000 Tachycardia, unspecified: Secondary | ICD-10-CM

## 2012-02-01 DIAGNOSIS — J9801 Acute bronchospasm: Secondary | ICD-10-CM

## 2012-02-01 DIAGNOSIS — J40 Bronchitis, not specified as acute or chronic: Secondary | ICD-10-CM

## 2012-02-01 LAB — POCT RAPID STREP A: Streptococcus, Group A Screen (Direct): NEGATIVE

## 2012-02-01 MED ORDER — PHENYLEPHRINE-CHLORPHEN-DM 10-4-12.5 MG/5ML PO LIQD: 5.0000 mL | ORAL | Status: DC | PRN

## 2012-02-01 MED ORDER — ONDANSETRON HCL 4 MG PO TABS: 4.0000 mg | ORAL_TABLET | Freq: Four times a day (QID) | ORAL | Status: DC

## 2012-02-01 MED ORDER — ALBUTEROL SULFATE HFA 108 (90 BASE) MCG/ACT IN AERS: 2.0000 | INHALATION_SPRAY | RESPIRATORY_TRACT | Status: DC | PRN

## 2012-02-01 NOTE — ED Provider Notes (Signed)
History     CSN: [id number]  Arrival date & time 02/01/12  1408   None     Chief Complaint  Patient presents with  . URI    (Consider location/radiation/quality/duration/timing/severity/associated sxs/prior treatment) HPI Comments: 46-year-old female who presents with "a bad cough, bodyaches, anterior chest pain associated with coughing and breathing, weakness, chills. She denies any known fever. She states she initially had nausea so she has not been eating or drinking much. She has been eating ice chips and not maintain a good fluid intake.   Past Medical History  Diagnosis Date  . PONV (postoperative nausea and vomiting) 2002  . 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 Depression     as a child  . Fibroid   . H/O: menorrhagia 09/14/2010  . Endometrial polyp 09/14/2010  . Cyst, breast   . Irregular uterine bleeding 09/14/2010  . Headache     migraines  . Trichomonas   . Ovarian cyst   . ASCUS (atypical squamous cells of undetermined significance) on Pap smear   . Fibrocystic breast 2005  . LGSIL (low grade squamous intraepithelial lesion) on Pap smear 2007  . Vaginosis 2008  . Libido, decreased 2010  . 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 Shoulder arthroscopy w/ rotator cuff repair 2006  . Oophorectomy 2000  . Tubal ligation 2000  . Dilation and curettage of uterus   . Breast cyst  removed    . Breast surgery 1986    biopsy  . Ganglion cyst removed from wrist   . Myomectomy     In [place]    Family History  Problem Relation Age of Onset  . Arthritis Mother   . Diabetes Mother   . Cancer Father     lung cancer  . Hyperlipidemia Maternal Grandmother   . Breast cancer Maternal Grandmother     History  Substance Use Topics  . Smoking status: Current Every Day Smoker -- 0.2 packs/day for 20 years    Types: Cigarettes  . Smokeless tobacco: Never Used  . Alcohol Use: Yes     Comment: very rarely - less  than once a week    OB History    Grav Para Term Preterm Abortions TAB SAB Ect Mult Living   3 3 3       3       Review of Systems  Constitutional: Positive for chills, appetite change and fatigue. Negative for fever.       Fatigue malaise  HENT: Positive for congestion and postnasal drip. Negative for ear pain, sore throat, neck pain and neck stiffness.   Respiratory: Positive for cough and shortness of breath. Negative for chest tightness.   Cardiovascular: Positive for chest pain. Negative for leg swelling.  Gastrointestinal: Negative.   Genitourinary: Negative.   Skin: Negative.     Allergies  Review of patient's allergies indicates no known allergies.  Home Medications   Current Outpatient Rx  Name  Route  Sig  Dispense  Refill  . ACETAMINOPHEN 500 MG PO TABS   Oral   Take 1,000 mg by mouth every 6 (six) hours as needed.         . ALBUTEROL SULFATE HFA 108 (90 BASE) MCG/ACT IN AERS   Inhalation   Inhale 2 puffs into the lungs every 4 (four) hours as needed for wheezing. Dispense with aerochamber   1 Inhaler  0   . CYCLOBENZAPRINE HCL 10 MG PO TABS   Oral   Take 10 mg by mouth every 8 (eight) hours as needed.           Marland Kitchen HYDROCODONE-ACETAMINOPHEN 5-325 MG PO TABS      1 to 2 tabs every 4 to 6 hours as needed for pain.   20 tablet   0   . ONDANSETRON HCL 4 MG PO TABS   Oral   Take 1 tablet (4 mg total) by mouth every 6 (six) hours.   12 tablet   0   . PHENYLEPHRINE-CHLORPHEN-DM 11-16-10.5 MG/5ML PO LIQD   Oral   Take 5 mLs by mouth every 4 (four) hours as needed.   120 mL   0   . PREDNISONE 10 MG PO TABS      Take 4 tabs daily for 4 days, 3 tabs daily for 4 days, 2 tabs daily for 4 days, then 1 tab daily for 4 days.   40 tablet   0   . TIZANIDINE HCL 4 MG PO TABS   Oral   Take 1 tablet (4 mg total) by mouth every 6 (six) hours as needed.   30 tablet   0     BP 130/87  Pulse 149  Temp 98.2 F (36.8 C) (Oral)  Resp 16  SpO2 97%  LMP  02/01/2012  Physical Exam  Nursing note and vitals reviewed. Constitutional: She is oriented to person, place, and time. She appears well-developed and well-nourished. No distress.  HENT:       Bilateral TMs are normal Oropharynx with mild to moderate erythema but no exudates or swelling. Posterior pharynx with some cobblestoning to  Neck: Normal range of motion. Neck supple.  Cardiovascular: Regular rhythm.        Tachycardic with a heart rate of 149, apical pulse is bounding.  Pulmonary/Chest: Effort normal. No respiratory distress. She has wheezes. She has no rales. She exhibits tenderness.  Musculoskeletal: Normal range of motion. She exhibits no edema.  Lymphadenopathy:    She has no cervical adenopathy.  Neurological: She is alert and oriented to person, place, and time. She exhibits normal muscle tone.  Skin: Skin is warm and dry. No rash noted.  Psychiatric: She has a normal mood and affect.    ED Course  Procedures (including critical care time)   Labs Reviewed  POCT RAPID STREP A ([facility] CARE ONLY)   Dg Chest 2 View  02/01/2012  *RADIOLOGY REPORT*  Clinical Data: Chest pain, smoker  CHEST - 2 VIEW  Comparison: 06/01/2011  Findings: Normal heart size, mediastinal contours, and pulmonary vascularity. Bronchitic changes and hyperinflation with linear areas of scarring in the right upper lobe and lingula. No acute infiltrate, pleural effusion or pneumothorax. Bones unremarkable.  IMPRESSION: Bronchitic changes and hyperinflation with linear areas of scarring in both lungs. No acute abnormalities.   Original Report Authenticated By: Ulyses Southward, M.D.      1. Bronchitis   2. Bronchospasm   3. Tachycardia       MDM  Dg Chest 2 View  02/01/2012  *RADIOLOGY REPORT*  Clinical Data: Chest pain, smoker  CHEST - 2 VIEW  Comparison: 06/01/2011  Findings: Normal heart size, mediastinal contours, and pulmonary vascularity. Bronchitic changes and hyperinflation with linear areas of  scarring in the right upper lobe and lingula. No acute infiltrate, pleural effusion or pneumothorax. Bones unremarkable.  IMPRESSION: Bronchitic changes and hyperinflation with linear areas of scarring in both  lungs. No acute abnormalities.   Original Report Authenticated By: Ulyses Southward, M.D.    EKG: Sinus tachycardia no ischemic changes some nonspecific T-wave abnormalities, no ectopy. I suspect her tachycardia is due to dehydration she has had very poor fluid intake over the past 4 days. We will treat her with [age]-year-old HFA 2 puffs every 4-6 hours when necessary cough and wheeze Zofran 4 mg every 6 hours when necessary nausea or vomiting Norell CS 1 teaspoon every 4 hours when necessary cough and congestion Must drink plenty of fluids and stay well hydrated. Consider Gatorade, Sprite, clear liquids. Stay home rest for the next 3 days.           Hayden Rasmussen, NP 02/01/12 1626

## 2012-02-01 NOTE — ED Provider Notes (Signed)
Medical screening examination/treatment/procedure(s) were performed by resident physician or non-physician practitioner and as supervising physician I was immediately available for consultation/collaboration.   KINDL,JAMES DOUGLAS MD.    James D Kindl, MD 02/01/12 1957 

## 2012-02-01 NOTE — ED Notes (Signed)
Pt c/o cold sx x5 days... Sx include: dry cough, sore throat, chills, chest discomfort due to cough... Denies: fevers, diarrhea... Has tried nyquil and motrin w/some relief.. She is alert w/no signs of acute distress

## 2012-02-09 ENCOUNTER — Telehealth (HOSPITAL_COMMUNITY): Payer: Self-pay | Admitting: Emergency Medicine

## 2012-02-09 NOTE — ED Notes (Signed)
Patient called stating that cough medicine was not working.  Chart reviewed by Hayden Rasmussen NP.  He advised patient she needs to be re-evaluated given the length of time since visit.  Patient expressed understanding

## 2012-03-29 ENCOUNTER — Other Ambulatory Visit: Payer: Self-pay

## 2012-05-25 ENCOUNTER — Emergency Department (HOSPITAL_COMMUNITY)
Admission: EM | Admit: 2012-05-25 | Discharge: 2012-05-25 | Disposition: A | Discharge status: Home / Self Care | Payer: Medicare Other | Attending: Emergency Medicine | Admitting: Emergency Medicine

## 2012-05-25 ENCOUNTER — Encounter (HOSPITAL_COMMUNITY): Payer: Self-pay | Admitting: Family Medicine

## 2012-05-25 DIAGNOSIS — H3553 Other dystrophies primarily involving the sensory retina: Secondary | ICD-10-CM | POA: Insufficient documentation

## 2012-05-25 DIAGNOSIS — G43909 Migraine, unspecified, not intractable, without status migrainosus: Secondary | ICD-10-CM

## 2012-05-25 DIAGNOSIS — F17200 Nicotine dependence, unspecified, uncomplicated: Secondary | ICD-10-CM | POA: Insufficient documentation

## 2012-05-25 DIAGNOSIS — M1990 Unspecified osteoarthritis, unspecified site: Secondary | ICD-10-CM | POA: Insufficient documentation

## 2012-05-25 DIAGNOSIS — Z8659 Personal history of other mental and behavioral disorders: Secondary | ICD-10-CM | POA: Insufficient documentation

## 2012-05-25 DIAGNOSIS — Z8619 Personal history of other infectious and parasitic diseases: Secondary | ICD-10-CM | POA: Insufficient documentation

## 2012-05-25 DIAGNOSIS — Z79899 Other long term (current) drug therapy: Secondary | ICD-10-CM | POA: Insufficient documentation

## 2012-05-25 DIAGNOSIS — Z8742 Personal history of other diseases of the female genital tract: Secondary | ICD-10-CM | POA: Insufficient documentation

## 2012-05-25 MED ORDER — DIPHENHYDRAMINE HCL 50 MG/ML IJ SOLN
25.0000 mg | Freq: Once | INTRAMUSCULAR | Status: AC
  Administered 2012-05-25: 25 mg via INTRAVENOUS
  Filled 2012-05-25: qty 1

## 2012-05-25 MED ORDER — SODIUM CHLORIDE 0.9 % IV BOLUS (SEPSIS)
1000.0000 mL | Freq: Once | INTRAVENOUS | Status: AC
  Administered 2012-05-25: 1000 mL via INTRAVENOUS

## 2012-05-25 MED ORDER — KETOROLAC TROMETHAMINE 30 MG/ML IJ SOLN
30.0000 mg | Freq: Once | INTRAMUSCULAR | Status: AC
  Administered 2012-05-25: 30 mg via INTRAVENOUS
  Filled 2012-05-25: qty 1

## 2012-05-25 MED ORDER — METOCLOPRAMIDE HCL 5 MG/ML IJ SOLN
10.0000 mg | Freq: Once | INTRAMUSCULAR | Status: AC
  Administered 2012-05-25: 10 mg via INTRAVENOUS
  Filled 2012-05-25: qty 2

## 2012-05-25 NOTE — ED Provider Notes (Signed)
History     CSN: [id number]  Arrival date & time 05/25/12  1258   First MD Initiated Contact with Patient 05/25/12 1443      Chief Complaint  Patient presents with  . Migraine    (Consider location/radiation/quality/duration/timing/severity/associated sxs/prior treatment) HPI  47 year old female with history of migraine headache presents complaining of headache. Patient reports for the past 2 weeks she has had persistent pain to the right side of her head. Describe pain as a dull and achy sensation, persistent, with associated light and sound sensitivity, and nausea and vomiting. This symptom is minimally improved with Excedrin headache and Maxalt. She denies fever, chills, vision changes, sneezing, coughing, neck stiffness, abdominal pain, diarrhea , or rash. Headache feels similar to prior headaches. Her last major headache was a year ago. She is unsure what triggers this particular headache.  Past Medical History  Diagnosis Date  . PONV (postoperative nausea and vomiting) 2002  . 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 Depression     as a child  . Fibroid   . H/O: menorrhagia 09/14/2010  . Endometrial polyp 09/14/2010  . Cyst, breast   . Irregular uterine bleeding 09/14/2010  . Headache     migraines  . Trichomonas   . Ovarian cyst   . ASCUS (atypical squamous cells of undetermined significance) on Pap smear   . Fibrocystic breast 2005  . LGSIL (low grade squamous intraepithelial lesion) on Pap smear 2007  . Vaginosis 2008  . Libido, decreased 2010  . Stargardt's disease 1986    Patient is legally blind    Past Surgical History  Procedure Laterality Date  . Shoulder arthroscopy w/ rotator cuff repair  2006  . Oophorectomy  2000  . Tubal ligation  2000  . Dilation and curettage of uterus    . Breast cyst  removed     . Breast surgery  1986    biopsy  . Ganglion cyst removed from wrist    . [place]omectomy      In [place]    Family History  Problem Relation Age of Onset  .  Arthritis Mother   . Diabetes Mother   . Cancer Father     lung cancer  . Hyperlipidemia Maternal Grandmother   . Breast cancer Maternal Grandmother     History  Substance Use Topics  . Smoking status: Current Every Day Smoker -- 0.25 packs/day for 20 years    Types: Cigarettes  . Smokeless tobacco: Never Used  . Alcohol Use: Yes     Comment: very rarely - less than once a week    OB History   Grav Para Term Preterm Abortions TAB SAB Ect Mult Living   3 3 3       3       Review of Systems  Constitutional:       10 Systems reviewed and all are negative for acute change except as noted in the HPI.     Allergies  Review of patient's allergies indicates no known allergies.  Home Medications   Current Outpatient Rx  Name  Route  Sig  Dispense  Refill  . acetaminophen (TYLENOL) 500 MG tablet   Oral   Take 1,000 mg by mouth every 6 (six) hours as needed.         Marland Kitchen albuterol (PROVENTIL HFA;VENTOLIN HFA) 108 (90 BASE) MCG/ACT inhaler   Inhalation   Inhale 2 puffs into the lungs every 4 (four) hours as needed for  wheezing. Dispense with aerochamber   1 Inhaler   0   . cyclobenzaprine (FLEXERIL) 10 MG tablet   Oral   Take 10 mg by mouth every 8 (eight) hours as needed.           Marland Kitchen HYDROcodone-acetaminophen (NORCO/VICODIN) 5-325 MG per tablet      1 to 2 tabs every 4 to 6 hours as needed for pain.   20 tablet   0   . ondansetron (ZOFRAN) 4 MG tablet   Oral   Take 1 tablet (4 mg total) by mouth every 6 (six) hours.   12 tablet   0   . Phenylephrine-Chlorphen-DM 11-16-10.5 MG/5ML LIQD   Oral   Take 5 mLs by mouth every 4 (four) hours as needed.   120 mL   0   . predniSONE (DELTASONE) 10 MG tablet      Take 4 tabs daily for 4 days, 3 tabs daily for 4 days, 2 tabs daily for 4 days, then 1 tab daily for 4 days.   40 tablet   0   . tiZANidine (ZANAFLEX) 4 MG tablet   Oral   Take 1 tablet (4 mg total) by mouth every 6 (six) hours as needed.   30 tablet    0     BP 128/91  Pulse 83  Temp(Src) 98.3 F (36.8 C) (Oral)  Resp 22  SpO2 100%  LMP 05/11/2012  Physical Exam  Nursing note and vitals reviewed. Constitutional: She is oriented to person, place, and time. She appears well-developed and well-nourished. No distress.  HENT:  Head: Normocephalic and atraumatic.  Right Ear: External ear normal.  Left Ear: External ear normal.  Nose: Nose normal.  Mouth/Throat: Oropharynx is clear and moist. No oropharyngeal exudate.  Eyes: Conjunctivae and EOM are normal. Pupils are equal, round, and reactive to light.  Neck: Normal range of motion. Neck supple. No JVD present.  Abdominal: Soft. There is no tenderness.  Musculoskeletal: Normal range of motion. She exhibits no edema and no tenderness.  Lymphadenopathy:    She has no cervical adenopathy.  Neurological: She is alert and oriented to person, place, and time. She has normal strength. No cranial nerve deficit or sensory deficit. Coordination and gait normal. GCS eye subscore is 4. GCS verbal subscore is 5. GCS motor subscore is 6.  Skin: Skin is warm. No rash noted.  Psychiatric: She has a normal mood and affect.    ED Course  Procedures (including critical care time)  2:59 PM Patient presents with right-sided headache with prior history of migraine. She is afebrile with stable normal vital signs. No red flags. No sudden onset thunderclap to suggest subarachnoid hemorrhage, no focal neuro deficit.  Since headache in same location, which could suggest clusterred headache, will give migraine cocktail and supplemental O2.  Will continue to monitor.  Pt is mildly tachy, IVF given.   5:28 PM Patient reports feeling much better after receiving treatment. She is stable for discharge. She is encouraged to follow up with her primary care Dr. for further management. Strict return precautions discussed. All questions answered to patient's satisfaction. Labs Reviewed - No data to display No results  found.   1. Migraine       MDM  BP 114/67  Pulse 80  Temp(Src) 98.3 F (36.8 C) (Oral)  Resp 22  SpO2 97%  LMP 05/11/2012  I have reviewed nursing notes and vital signs.  I reviewed available ER/hospitalization records thought the EMR  Fayrene Helper, PA-C 05/25/12 1731

## 2012-05-25 NOTE — ED Notes (Signed)
Per pt sts 2 weeks of HA. sts hx of migraines. sts some light nausea.

## 2012-05-26 NOTE — ED Provider Notes (Signed)
Medical screening examination/treatment/procedure(s) were conducted as a shared visit with non-physician practitioner(s) and myself.  I personally evaluated the patient during the encounter.  No neuro def.  feels better IV fluids, pain management  Donnetta Hutching, MD 05/26/12 1524

## 2012-08-18 ENCOUNTER — Other Ambulatory Visit: Payer: Self-pay

## 2012-08-20 ENCOUNTER — Other Ambulatory Visit: Payer: Self-pay | Admitting: Obstetrics and Gynecology

## 2012-08-20 DIAGNOSIS — Z1231 Encounter for screening mammogram for malignant neoplasm of breast: Secondary | ICD-10-CM

## 2012-09-25 ENCOUNTER — Ambulatory Visit
Admission: RE | Admit: 2012-09-25 | Discharge: 2012-09-25 | Disposition: A | Discharge status: Home / Self Care | Payer: Medicare Other | Source: Ambulatory Visit | Attending: Obstetrics and Gynecology | Admitting: Obstetrics and Gynecology

## 2012-09-25 DIAGNOSIS — Z1231 Encounter for screening mammogram for malignant neoplasm of breast: Secondary | ICD-10-CM

## 2012-09-26 ENCOUNTER — Other Ambulatory Visit: Payer: Self-pay | Admitting: Obstetrics and Gynecology

## 2012-09-26 DIAGNOSIS — N63 Unspecified lump in unspecified breast: Secondary | ICD-10-CM

## 2012-10-17 ENCOUNTER — Ambulatory Visit
Admission: RE | Admit: 2012-10-17 | Discharge: 2012-10-17 | Disposition: A | Discharge status: Home / Self Care | Payer: Medicare Other | Source: Ambulatory Visit | Attending: Obstetrics and Gynecology | Admitting: Obstetrics and Gynecology

## 2012-10-17 ENCOUNTER — Other Ambulatory Visit: Payer: Self-pay | Admitting: Obstetrics and Gynecology

## 2012-10-17 DIAGNOSIS — N63 Unspecified lump in unspecified breast: Secondary | ICD-10-CM

## 2012-10-23 ENCOUNTER — Ambulatory Visit
Admission: RE | Admit: 2012-10-23 | Discharge: 2012-10-23 | Disposition: A | Discharge status: Home / Self Care | Payer: Medicare Other | Source: Ambulatory Visit | Attending: Obstetrics and Gynecology | Admitting: Obstetrics and Gynecology

## 2012-10-23 DIAGNOSIS — N63 Unspecified lump in unspecified breast: Secondary | ICD-10-CM

## 2012-11-18 ENCOUNTER — Other Ambulatory Visit: Payer: Self-pay | Admitting: Obstetrics and Gynecology

## 2012-11-27 ENCOUNTER — Other Ambulatory Visit: Payer: Self-pay | Admitting: Obstetrics and Gynecology

## 2012-11-29 NOTE — Addendum Note (Signed)
Addended by: Janine Limbo on: 11/29/2012 06:02 AM   Modules accepted: Orders

## 2012-12-08 ENCOUNTER — Encounter (HOSPITAL_COMMUNITY): Payer: Self-pay | Admitting: Pharmacist

## 2012-12-18 ENCOUNTER — Other Ambulatory Visit: Payer: Self-pay

## 2012-12-19 ENCOUNTER — Encounter (HOSPITAL_COMMUNITY): Payer: Self-pay

## 2012-12-19 ENCOUNTER — Encounter (HOSPITAL_COMMUNITY)
Admission: RE | Admit: 2012-12-19 | Discharge: 2012-12-19 | Disposition: A | Discharge status: Home / Self Care | Payer: Medicare Other | Source: Ambulatory Visit | Attending: Obstetrics and Gynecology | Admitting: Obstetrics and Gynecology

## 2012-12-19 DIAGNOSIS — Z01810 Encounter for preprocedural cardiovascular examination: Secondary | ICD-10-CM | POA: Insufficient documentation

## 2012-12-19 DIAGNOSIS — Z01812 Encounter for preprocedural laboratory examination: Secondary | ICD-10-CM | POA: Insufficient documentation

## 2012-12-19 DIAGNOSIS — Z01818 Encounter for other preprocedural examination: Secondary | ICD-10-CM | POA: Insufficient documentation

## 2012-12-19 LAB — CBC
Hemoglobin: 11.2 g/dL — ABNORMAL LOW (ref 12.0–15.0)
MCH: 28.4 pg (ref 26.0–34.0)
RBC: 3.94 MIL/uL (ref 3.87–5.11)
WBC: 6.5 10*3/uL (ref 4.0–10.5)

## 2012-12-19 LAB — BASIC METABOLIC PANEL
GFR calc Af Amer: >90 mL/min (ref >90)
GFR calc non Af Amer: >90 mL/min (ref >90)
Glucose, Bld: 87 mg/dL (ref 70–99)
Potassium: 3.8 mEq/L (ref 3.5–5.1)
Sodium: 139 mEq/L (ref 135–145)

## 2012-12-19 NOTE — Patient Instructions (Signed)
20 Karen Hardin  12/19/2012   Your procedure is scheduled on:  December 26, 2012  Enter through the [employer] [facility] at 0800 AM.  Pick up the phone at the desk and dial [phone].   Call this number if you have problems the morning of surgery: [phone]   Remember:   Do not eat food:After Midnight.  Do not drink clear liquids: After Midnight.  Take these medicines the morning of surgery with A SIP OF WATER: Bring albuterol inhaler day of surgery.   Do not wear jewelry, make-up or nail polish.  Do not wear lotions, powders, or perfumes. You may wear deodorant.  Do not shave 48 hours prior to surgery.  Do not bring valuables to the hospital.  [facility] is not   responsible for any belongings or valuables brought to the hospital.  Contacts, dentures or bridgework may not be worn into surgery.  Leave suitcase in the car. After surgery it may be brought to your room.  For patients admitted to the hospital, checkout time is 11:00 AM the day of              discharge.   Patients discharged the day of surgery will not be allowed to drive             home.  Name and phone number of your driver: husband  Special Instructions:   Shower using CHG 2 nights before surgery and the night before surgery.  If you shower the day of surgery use CHG.  Use special wash - you have one bottle of CHG for all showers.  You should use approximately 1/3 of the bottle for each shower.   Please read over the following fact sheets that you were given:   Getting ready for surgery.

## 2012-12-22 NOTE — H&P (Addendum)
Admission History and Physical Exam for a Gynecology Patient  Karen Hardin is a 47 y.o. female, [phone], who presents for a laparoscopy assisted vaginal hysterectomy. The patient complains of menorrhagia. She has a known history of anemia. Her past surgical history includes a myomectomy, laparoscopic tubal cautery, diagnostic laparoscopy with resection of endometriosis, and hysteroscopy with resection of a submucosal fibroid. Her endometrial biopsy is benign. Her most recent hemoglobin was 11.2. Her most recent Pap smear was within normal limits. Gonorrhea and Chlamydia cultures are negative. An ultrasound showed a multi-fibroid uterus. The endometrium measured 4.86 cm. The left ovary has been surgically removed. The right ovary appeared normal. She has been followed at the [facility].  OB History   Grav Para Term Preterm Abortions TAB SAB Ect Mult Living   3 3 3       3       Past Medical History  Diagnosis Date  . PONV (postoperative nausea and vomiting) 2002  . 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 Fibroid   . H/O: menorrhagia 09/14/2010  . Endometrial polyp 09/14/2010  . Cyst, breast   . Irregular uterine bleeding 09/14/2010  . Headache(784.0)     migraines  . Trichomonas   . Ovarian cyst   . ASCUS (atypical squamous cells of undetermined significance) on Pap smear   . Fibrocystic breast 2005  . LGSIL (low grade squamous intraepithelial lesion) on Pap smear 2007  . Vaginosis 2008  . 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 Depression     as a child, and stress recently  . Libido, decreased 2010    No prescriptions prior to admission    Past Surgical History  Procedure Laterality Date  . Shoulder arthroscopy w/ rotator cuff repair  2006  . Oophorectomy  2000  . Tubal ligation  2000  . Dilation and curettage of uterus    . Breast cyst  removed     . Breast surgery  1986    biopsy  .  Ganglion cyst removed from wrist    . Myomectomy      In [place]    No Known Allergies  Family History: family history includes Arthritis in her mother; Breast cancer in her maternal grandmother; Cancer in her father; Diabetes in her mother; Hyperlipidemia in her maternal grandmother.  Social History:  reports that she quit smoking about 2 weeks ago. Her smoking use included Cigarettes. She has a 5 pack-year smoking history. She has never used smokeless tobacco. She reports that she drinks alcohol. She reports that she does not use illicit drugs.  Review of systems: See HPI.  Admission Physical Exam:    There is no weight on file to calculate BMI.  There were no vitals taken for this visit.  HEENT:                 Within normal limits Chest:                   Clear Heart:                    Regular rate and rhythm Breasts:                No masses, skin changes, bleeding, or discharge present Abdomen:             Nontender, no masses Extremities:  Grossly normal Neurologic exam: Grossly normal  Pelvic exam:  External genitalia: normal general appearance Vaginal: normal without tenderness, induration or masses Cervix: normal appearance Adnexa: normal bimanual exam Uterus: enlarged Rectal: no masses  CBC    Component Value Date/Time   WBC 6.5 12/19/2012 1115   RBC 3.94 12/19/2012 1115   HGB 11.2* 12/19/2012 1115   HCT 33.8* 12/19/2012 1115   PLT 291 12/19/2012 1115   MCV 85.8 12/19/2012 1115   MCH 28.4 12/19/2012 1115   MCHC 33.1 12/19/2012 1115   RDW 16.2* 12/19/2012 1115    Assessment:  12-14 week size fibroid uterus  Menorrhagia  Endometriosis  Anemia  History of depression  Prior myomectomy  Stargardt's Disease (legally blind)  Overweight  Plan:  The patient will undergo a laparoscopy assisted vaginal hysterectomy. She understands the indications for surgical procedure as well as the alternative treatment options. She accepts the risk of, but  not limited to, anesthetic complications, bleeding, infections, and possible damage to the surrounding organs. She understands that there is a possibility that there may be dense pelvic adhesions and that an open laparotomy may need to be performed.   Janine Limbo 12/22/2012

## 2012-12-25 ENCOUNTER — Telehealth: Payer: Self-pay | Admitting: Obstetrics and Gynecology

## 2012-12-25 MED ORDER — CEFAZOLIN SODIUM-DEXTROSE 2-3 GM-% IV SOLR
2.0000 g | INTRAVENOUS | Status: AC
  Administered 2012-12-26: 2 g via INTRAVENOUS

## 2012-12-26 ENCOUNTER — Ambulatory Visit (HOSPITAL_COMMUNITY)
Admission: RE | Admit: 2012-12-26 | Discharge: 2012-12-27 | Disposition: A | Discharge status: Home / Self Care | Payer: Medicare Other | Source: Ambulatory Visit | Attending: Obstetrics and Gynecology | Admitting: Obstetrics and Gynecology

## 2012-12-26 ENCOUNTER — Ambulatory Visit (HOSPITAL_COMMUNITY): Payer: Medicare Other | Admitting: Anesthesiology

## 2012-12-26 ENCOUNTER — Encounter (HOSPITAL_COMMUNITY): Payer: Medicare Other | Admitting: Anesthesiology

## 2012-12-26 ENCOUNTER — Encounter (HOSPITAL_COMMUNITY): Payer: Self-pay

## 2012-12-26 ENCOUNTER — Encounter (HOSPITAL_COMMUNITY)
Admission: RE | Disposition: A | Discharge status: Home / Self Care | Payer: Self-pay | Source: Ambulatory Visit | Attending: Obstetrics and Gynecology

## 2012-12-26 DIAGNOSIS — E669 Obesity, unspecified: Secondary | ICD-10-CM | POA: Insufficient documentation

## 2012-12-26 DIAGNOSIS — D252 Subserosal leiomyoma of uterus: Secondary | ICD-10-CM | POA: Insufficient documentation

## 2012-12-26 DIAGNOSIS — Z23 Encounter for immunization: Secondary | ICD-10-CM | POA: Insufficient documentation

## 2012-12-26 DIAGNOSIS — H3553 Other dystrophies primarily involving the sensory retina: Secondary | ICD-10-CM | POA: Insufficient documentation

## 2012-12-26 DIAGNOSIS — N920 Excessive and frequent menstruation with regular cycle: Secondary | ICD-10-CM | POA: Insufficient documentation

## 2012-12-26 DIAGNOSIS — Z87891 Personal history of nicotine dependence: Secondary | ICD-10-CM | POA: Insufficient documentation

## 2012-12-26 DIAGNOSIS — N8000 Endometriosis of the uterus, unspecified: Secondary | ICD-10-CM | POA: Insufficient documentation

## 2012-12-26 DIAGNOSIS — D649 Anemia, unspecified: Secondary | ICD-10-CM | POA: Insufficient documentation

## 2012-12-26 DIAGNOSIS — D251 Intramural leiomyoma of uterus: Secondary | ICD-10-CM | POA: Insufficient documentation

## 2012-12-26 DIAGNOSIS — I10 Essential (primary) hypertension: Secondary | ICD-10-CM | POA: Insufficient documentation

## 2012-12-26 HISTORY — DX: Unspecified asthma, uncomplicated: J45.909

## 2012-12-26 HISTORY — PX: LAPAROSCOPIC ASSISTED VAGINAL HYSTERECTOMY: SHX5398

## 2012-12-26 HISTORY — PX: CYSTOSCOPY: SHX5120

## 2012-12-26 LAB — PREGNANCY, URINE: Preg Test, Ur: NEGATIVE

## 2012-12-26 SURGERY — HYSTERECTOMY, VAGINAL, LAPAROSCOPY-ASSISTED
Anesthesia: General | Site: Urethra | Wound class: Clean Contaminated

## 2012-12-26 MED ORDER — FENTANYL CITRATE 0.05 MG/ML IJ SOLN
INTRAMUSCULAR | Status: AC
  Filled 2012-12-26: qty 2

## 2012-12-26 MED ORDER — HYDROMORPHONE HCL PF 1 MG/ML IJ SOLN
INTRAMUSCULAR | Status: AC
  Administered 2012-12-26: 0.5 mg via INTRAVENOUS
  Filled 2012-12-26: qty 1

## 2012-12-26 MED ORDER — BUPIVACAINE-EPINEPHRINE 0.5% -1:200000 IJ SOLN
INTRAMUSCULAR | Status: DC | PRN
  Administered 2012-12-26: 6 mL

## 2012-12-26 MED ORDER — ONDANSETRON HCL 4 MG PO TABS: 4.0000 mg | ORAL_TABLET | Freq: Four times a day (QID) | ORAL | Status: DC | PRN

## 2012-12-26 MED ORDER — NEOSTIGMINE METHYLSULFATE 1 MG/ML IJ SOLN
INTRAMUSCULAR | Status: AC
  Filled 2012-12-26: qty 1

## 2012-12-26 MED ORDER — SIMETHICONE 80 MG PO CHEW: 80.0000 mg | CHEWABLE_TABLET | Freq: Four times a day (QID) | ORAL | Status: DC | PRN

## 2012-12-26 MED ORDER — ROCURONIUM BROMIDE 100 MG/10ML IV SOLN
INTRAVENOUS | Status: AC
  Filled 2012-12-26: qty 1

## 2012-12-26 MED ORDER — DOCUSATE SODIUM 100 MG PO CAPS
100.0000 mg | ORAL_CAPSULE | Freq: Two times a day (BID) | ORAL | Status: DC
  Administered 2012-12-26 – 2012-12-27 (×2): 100 mg via ORAL
  Filled 2012-12-26 (×2): qty 1

## 2012-12-26 MED ORDER — CEFAZOLIN SODIUM-DEXTROSE 2-3 GM-% IV SOLR
INTRAVENOUS | Status: AC
  Filled 2012-12-26: qty 50

## 2012-12-26 MED ORDER — IBUPROFEN 800 MG PO TABS: 800.0000 mg | ORAL_TABLET | Freq: Three times a day (TID) | ORAL | Status: DC | PRN

## 2012-12-26 MED ORDER — VASOPRESSIN 20 UNIT/ML IJ SOLN
INTRAMUSCULAR | Status: AC
  Filled 2012-12-26: qty 1

## 2012-12-26 MED ORDER — HYDROMORPHONE HCL PF 1 MG/ML IJ SOLN
0.2500 mg | INTRAMUSCULAR | Status: DC | PRN
  Administered 2012-12-26 (×4): 0.5 mg via INTRAVENOUS

## 2012-12-26 MED ORDER — GLYCOPYRROLATE 0.2 MG/ML IJ SOLN
INTRAMUSCULAR | Status: DC | PRN
  Administered 2012-12-26: 0.4 mg via INTRAVENOUS

## 2012-12-26 MED ORDER — KETOROLAC TROMETHAMINE 30 MG/ML IJ SOLN
INTRAMUSCULAR | Status: AC
  Filled 2012-12-26: qty 1

## 2012-12-26 MED ORDER — KETOROLAC TROMETHAMINE 30 MG/ML IJ SOLN
INTRAMUSCULAR | Status: AC
  Filled 2012-12-26: qty 2

## 2012-12-26 MED ORDER — SODIUM CHLORIDE 0.9 % IJ SOLN: 9.0000 mL | INTRAMUSCULAR | Status: DC | PRN

## 2012-12-26 MED ORDER — METHYLENE BLUE 1 % INJ SOLN
INTRAMUSCULAR | Status: AC
  Filled 2012-12-26: qty 10

## 2012-12-26 MED ORDER — MIDAZOLAM HCL 2 MG/2ML IJ SOLN
INTRAMUSCULAR | Status: AC
  Filled 2012-12-26: qty 2

## 2012-12-26 MED ORDER — DIPHENHYDRAMINE HCL 12.5 MG/5ML PO ELIX: 12.5000 mg | ORAL_SOLUTION | Freq: Four times a day (QID) | ORAL | Status: DC | PRN

## 2012-12-26 MED ORDER — HYDROMORPHONE 0.3 MG/ML IV SOLN
INTRAVENOUS | Status: DC
  Administered 2012-12-26: 16:00:00 via INTRAVENOUS
  Administered 2012-12-26: 0.3 mg via INTRAVENOUS
  Administered 2012-12-26: 1.2 mg via INTRAVENOUS
  Administered 2012-12-27 (×2): 0.9 mg via INTRAVENOUS
  Administered 2012-12-27: 0.3 mg via INTRAVENOUS
  Filled 2012-12-26: qty 25

## 2012-12-26 MED ORDER — FENTANYL CITRATE 0.05 MG/ML IJ SOLN
INTRAMUSCULAR | Status: AC
  Filled 2012-12-26: qty 5

## 2012-12-26 MED ORDER — KETOROLAC TROMETHAMINE 30 MG/ML IJ SOLN
INTRAMUSCULAR | Status: DC | PRN
  Administered 2012-12-26: 30 mg via INTRAVENOUS
  Administered 2012-12-26: 30 mg via INTRAMUSCULAR

## 2012-12-26 MED ORDER — NALOXONE HCL 0.4 MG/ML IJ SOLN: 0.4000 mg | INTRAMUSCULAR | Status: DC | PRN

## 2012-12-26 MED ORDER — ONDANSETRON HCL 4 MG/2ML IJ SOLN
INTRAMUSCULAR | Status: AC
  Filled 2012-12-26: qty 2

## 2012-12-26 MED ORDER — SODIUM CHLORIDE 0.9 % IJ SOLN
INTRAMUSCULAR | Status: AC
  Filled 2012-12-26: qty 100

## 2012-12-26 MED ORDER — ONDANSETRON HCL 4 MG/2ML IJ SOLN: 4.0000 mg | Freq: Four times a day (QID) | INTRAMUSCULAR | Status: DC | PRN

## 2012-12-26 MED ORDER — FENTANYL CITRATE 0.05 MG/ML IJ SOLN
INTRAMUSCULAR | Status: DC | PRN
  Administered 2012-12-26: 100 ug via INTRAVENOUS
  Administered 2012-12-26: 50 ug via INTRAVENOUS
  Administered 2012-12-26: 100 ug via INTRAVENOUS
  Administered 2012-12-26 (×4): 50 ug via INTRAVENOUS

## 2012-12-26 MED ORDER — INFLUENZA VAC SPLIT QUAD 0.5 ML IM SUSP
0.5000 mL | INTRAMUSCULAR | Status: AC
  Administered 2012-12-27: 0.5 mL via INTRAMUSCULAR
  Filled 2012-12-26 (×2): qty 0.5

## 2012-12-26 MED ORDER — OXYCODONE-ACETAMINOPHEN 5-325 MG PO TABS: 1.0000 | ORAL_TABLET | ORAL | Status: DC | PRN

## 2012-12-26 MED ORDER — SODIUM CHLORIDE 0.9 % IJ SOLN
INTRAMUSCULAR | Status: AC
  Filled 2012-12-26: qty 10

## 2012-12-26 MED ORDER — KETOROLAC TROMETHAMINE 30 MG/ML IJ SOLN: 30.0000 mg | Freq: Four times a day (QID) | INTRAMUSCULAR | Status: DC

## 2012-12-26 MED ORDER — DEXTROSE IN LACTATED RINGERS 5 % IV SOLN
INTRAVENOUS | Status: DC
  Administered 2012-12-26 – 2012-12-27 (×2): via INTRAVENOUS

## 2012-12-26 MED ORDER — SODIUM CHLORIDE 0.9 % IV SOLN
INTRAVENOUS | Status: DC | PRN
  Administered 2012-12-26: 10:00:00 via INTRAMUSCULAR

## 2012-12-26 MED ORDER — LIDOCAINE HCL (CARDIAC) 20 MG/ML IV SOLN
INTRAVENOUS | Status: AC
  Filled 2012-12-26: qty 5

## 2012-12-26 MED ORDER — ONDANSETRON HCL 4 MG/2ML IJ SOLN
INTRAMUSCULAR | Status: DC | PRN
  Administered 2012-12-26: 4 mg via INTRAVENOUS

## 2012-12-26 MED ORDER — MIDAZOLAM HCL 2 MG/2ML IJ SOLN
INTRAMUSCULAR | Status: DC | PRN
  Administered 2012-12-26: 2 mg via INTRAVENOUS

## 2012-12-26 MED ORDER — BUPIVACAINE-EPINEPHRINE (PF) 0.5% -1:200000 IJ SOLN
INTRAMUSCULAR | Status: AC
  Filled 2012-12-26: qty 10

## 2012-12-26 MED ORDER — DIPHENHYDRAMINE HCL 50 MG/ML IJ SOLN: 12.5000 mg | Freq: Four times a day (QID) | INTRAMUSCULAR | Status: DC | PRN

## 2012-12-26 MED ORDER — ROCURONIUM BROMIDE 100 MG/10ML IV SOLN
INTRAVENOUS | Status: DC | PRN
  Administered 2012-12-26: 40 mg via INTRAVENOUS
  Administered 2012-12-26: 5 mg via INTRAVENOUS
  Administered 2012-12-26: 10 mg via INTRAVENOUS

## 2012-12-26 MED ORDER — KETOROLAC TROMETHAMINE 30 MG/ML IJ SOLN
30.0000 mg | Freq: Four times a day (QID) | INTRAMUSCULAR | Status: DC
  Administered 2012-12-26 – 2012-12-27 (×2): 30 mg via INTRAVENOUS
  Filled 2012-12-26 (×3): qty 1

## 2012-12-26 MED ORDER — DEXAMETHASONE SODIUM PHOSPHATE 10 MG/ML IJ SOLN
INTRAMUSCULAR | Status: DC | PRN
  Administered 2012-12-26: 10 mg via INTRAVENOUS

## 2012-12-26 MED ORDER — PROPOFOL 10 MG/ML IV BOLUS
INTRAVENOUS | Status: DC | PRN
  Administered 2012-12-26: 180 mg via INTRAVENOUS

## 2012-12-26 MED ORDER — LACTATED RINGERS IV SOLN
INTRAVENOUS | Status: DC
  Administered 2012-12-26: 11:00:00 via INTRAVENOUS
  Administered 2012-12-26: 50 mL/h via INTRAVENOUS
  Administered 2012-12-26 (×3): via INTRAVENOUS

## 2012-12-26 MED ORDER — GLYCOPYRROLATE 0.2 MG/ML IJ SOLN
INTRAMUSCULAR | Status: AC
  Filled 2012-12-26: qty 2

## 2012-12-26 MED ORDER — DEXAMETHASONE SODIUM PHOSPHATE 10 MG/ML IJ SOLN
INTRAMUSCULAR | Status: AC
  Filled 2012-12-26: qty 1

## 2012-12-26 MED ORDER — PNEUMOCOCCAL VAC POLYVALENT 25 MCG/0.5ML IJ INJ
0.5000 mL | INJECTION | INTRAMUSCULAR | Status: AC
  Administered 2012-12-27: 0.5 mL via INTRAMUSCULAR
  Filled 2012-12-26: qty 0.5

## 2012-12-26 MED ORDER — METHYLENE BLUE 1 % INJ SOLN
INTRAMUSCULAR | Status: DC | PRN
  Administered 2012-12-26: 5 mL via INTRAVENOUS

## 2012-12-26 MED ORDER — SODIUM CHLORIDE 0.9 % IJ SOLN
INTRAMUSCULAR | Status: DC | PRN
  Administered 2012-12-26: 10 mL via INTRAVENOUS

## 2012-12-26 MED ORDER — NEOSTIGMINE METHYLSULFATE 1 MG/ML IJ SOLN
INTRAMUSCULAR | Status: DC | PRN
  Administered 2012-12-26: 2 mg via INTRAVENOUS

## 2012-12-26 MED ORDER — LIDOCAINE HCL (CARDIAC) 20 MG/ML IV SOLN
INTRAVENOUS | Status: DC | PRN
  Administered 2012-12-26: 50 mg via INTRAVENOUS

## 2012-12-26 SURGICAL SUPPLY — 44 items
BLADE SURG 10 STRL SS (BLADE) ×1 IMPLANT
CABLE HIGH FREQUENCY MONO STRZ (ELECTRODE) IMPLANT
CATH ROBINSON RED A/P 16FR (CATHETERS) IMPLANT
CHLORAPREP W/TINT 26ML (MISCELLANEOUS) ×3 IMPLANT
CLOTH BEACON ORANGE TIMEOUT ST (SAFETY) ×3 IMPLANT
CONT PATH 16OZ SNAP LID 3702 (MISCELLANEOUS) ×3 IMPLANT
COVER TABLE BACK 60X90 (DRAPES) ×3 IMPLANT
DECANTER SPIKE VIAL GLASS SM (MISCELLANEOUS) IMPLANT
DRAPE PROXIMA HALF (DRAPES) ×3 IMPLANT
ELECT REM PT RETURN 9FT ADLT (ELECTROSURGICAL) ×3
ELECTRODE REM PT RTRN 9FT ADLT (ELECTROSURGICAL) ×2 IMPLANT
GAUZE SPONGE 4X4 16PLY XRAY LF (GAUZE/BANDAGES/DRESSINGS) ×3 IMPLANT
GAUZE VASELINE 3X9 (GAUZE/BANDAGES/DRESSINGS) IMPLANT
GLOVE BIOGEL PI IND STRL 6.5 (GLOVE) IMPLANT
GLOVE BIOGEL PI IND STRL 8.5 (GLOVE) ×2 IMPLANT
GLOVE BIOGEL PI INDICATOR 6.5 (GLOVE) ×1
GLOVE BIOGEL PI INDICATOR 8.5 (GLOVE) ×1
GLOVE ECLIPSE 8.0 STRL XLNG CF (GLOVE) ×12 IMPLANT
GOWN STRL REIN XL XLG (GOWN DISPOSABLE) ×12 IMPLANT
NEEDLE MAYO .5 CIRCLE (NEEDLE) ×3 IMPLANT
NS IRRIG 1000ML POUR BTL (IV SOLUTION) ×3 IMPLANT
PACK LAVH (CUSTOM PROCEDURE TRAY) ×3 IMPLANT
PROTECTOR NERVE ULNAR (MISCELLANEOUS) ×3 IMPLANT
SET CYSTO W/LG BORE CLAMP LF (SET/KITS/TRAYS/PACK) ×1 IMPLANT
SET IRRIG TUBING LAPAROSCOPIC (IRRIGATION / IRRIGATOR) IMPLANT
SOLUTION ELECTROLUBE (MISCELLANEOUS) ×3 IMPLANT
STRIP CLOSURE SKIN 1/4X3 (GAUZE/BANDAGES/DRESSINGS) IMPLANT
SUT CHROMIC 1 CT1 27 (SUTURE) IMPLANT
SUT MNCRL AB 3-0 PS2 27 (SUTURE) ×3 IMPLANT
SUT VIC AB 0 CT1 18XCR BRD8 (SUTURE) ×6 IMPLANT
SUT VIC AB 0 CT1 27 (SUTURE) ×3
SUT VIC AB 0 CT1 27XBRD ANBCTR (SUTURE) ×6 IMPLANT
SUT VIC AB 0 CT1 36 (SUTURE) ×1 IMPLANT
SUT VIC AB 0 CT1 8-18 (SUTURE) ×9
SUT VIC AB 2-0 SH 27 (SUTURE) ×3
SUT VIC AB 2-0 SH 27XBRD (SUTURE) ×2 IMPLANT
SUT VICRYL 0 ENDOLOOP (SUTURE) IMPLANT
SUT VICRYL 0 TIES 12 18 (SUTURE) ×3 IMPLANT
SUT VICRYL 0 UR6 27IN ABS (SUTURE) ×5 IMPLANT
SYR 50ML LL SCALE MARK (SYRINGE) ×3 IMPLANT
TOWEL OR 17X24 6PK STRL BLUE (TOWEL DISPOSABLE) ×6 IMPLANT
TRAY FOLEY CATH 14FR (SET/KITS/TRAYS/PACK) ×3 IMPLANT
WARMER LAPAROSCOPE (MISCELLANEOUS) ×3 IMPLANT
WATER STERILE IRR 1000ML POUR (IV SOLUTION) ×4 IMPLANT

## 2012-12-26 NOTE — H&P (Signed)
The patient was interviewed and examined today.  The previously documented history and physical examination was reviewed. There are no changes. The operative procedure was reviewed. The risks and benefits were outlined again. The specific risks include, but are not limited to, anesthetic complications, bleeding, infections, and possible damage to the surrounding organs. The patient's questions were answered.  We are ready to proceed as outlined. The likelihood of the patient achieving the goals of this procedure is very likely.  BP 128/69  Pulse 79  Temp(Src) 97.5 F (36.4 C) (Oral)  Resp 20  SpO2 100%  LMP 12/14/2012  CBC    Component Value Date/Time   WBC 6.5 12/19/2012 1115   RBC 3.94 12/19/2012 1115   HGB 11.2* 12/19/2012 1115   HCT 33.8* 12/19/2012 1115   PLT 291 12/19/2012 1115   MCV 85.8 12/19/2012 1115   MCH 28.4 12/19/2012 1115   MCHC 33.1 12/19/2012 1115   RDW 16.2* 12/19/2012 1115   Leonard Schwartz, M.D.

## 2012-12-26 NOTE — Op Note (Addendum)
OPERATIVE NOTE  Karen Hardin  DOB:    [birth year]  MRN:    [id number]  CSN:    [id number]  Date of Surgery:  12/26/2012  Preoperative Diagnosis:  Fibroid uterus  Menorrhagia  Anemia  Endometrial systems  Prior myomectomy  Postoperative Diagnosis:  Same  Procedure:  Laparoscopy assisted Vaginal hysterectomy  Uterine morcellation Cystoscopy  Surgeon:  Leonard Schwartz, M.D.  Assistant:  Dierdre Forth M.D.  Anesthetic:  General  Disposition:  The patient presents with the above-mentioned diagnosis. She understands the indications for surgical procedure.  She also understands the alternative treatment options. She accepts the risk of, but not limited to, anesthetic complications, bleeding, infections, and possible damage to the surrounding organs.  Findings:  A 665 g multi-fibroid uterus was removed. The left ovary appeared normal. The right ovary was surgically removed. Defects were noted and both fallopian tubes. Morcellation was required to complete the surgery. Cystoscopy was performed and there was no evidence of damage to the bladder mucosa. Jets of urine were noted bilaterally from both ureteral orifices.  Procedure:  The patient was taken to the operating room where a general anesthetic was given. The patient's lower abdomen, perineum, and vagina were prepped with multiple layers of Betadine. DuraPrep was used to prep the abdomen. The patient was sterilely draped. An examination under anesthesia was performed. Operative findings are mentioned above. A Hulka tenaculum was placed inside the cervix. The subumbilical area was injected with 3 cc of half percent Marcaine with epinephrine. A subumbilical incision was made and carried sharply through the subcutaneous tissue and the fascia. The Hassan cannula was sutured into place. A pneumoperitoneum was obtained. The laparoscope was then inserted. A 5 mm trocar was placed in the lower abdomen under  direct visualization. The pelvis was visualized with findings as mentioned above. The posterior cul-de-sac was noted to be free of adhesions. We felt that we could complete the surgery appropriately via a vaginal hysterectomy. The cervix was injected with half percent Marcaine with epinephrine. A circumferential incision was made around the cervix. The vaginal mucosa was advanced anteriorly and posteriorly. The anterior cul-de-sac and in the posterior cul-de-sac were sharply entered. Alternating from right to left the uterosacral ligaments, paracervical tissues, parametrial tissues, and uterine arteries were clamped, cut, sutured, and tied securely. Uterine morcellation was required to remove the uterus because it was so large. Once the majority of the fibroids were removed, then we were able to clamp the upper pedicles. The upper pedicles were cut and suture-ligated. The uterus was removed from the operative field. The upper pedicles were secured using free ties and then suture ligatures. Hemostasis was confirmed. The sutures attached to the uterosacral ligaments were brought out through the vaginal angles and then tied securely. A McCall culdoplasty suture was placed in the posterior cul-de-sac incorporating the uterosacral ligaments bilaterally and the posterior peritoneum. A final check was made for hemostasis and again hemostasis was confirmed. The vaginal cuff was closed using figure-of-eight sutures incorporating the anterior vaginal mucosa, the anterior peritoneum, posterior peritoneum, and the posterior vaginal mucosa. The McCall culdoplasty suture was tied securely and the apex of the vagina was noted to elevate into the midpelvis. The patient was then given methylene blue. A Foley catheter was removed from the bladder. Cystoscopy was performed and there was no evidence of damage to the bladder or the ureters. The cystoscope was removed. A Foley catheter was reinserted. The operator then changed gown and  gloves. The pneumoperitoneum was reestablished. The  laparoscope was once again inserted. The pelvis was carefully inspected. No bleeding was noted. The pelvis was irrigated. At this point all instruments were removed. Figure-of-eight sutures were placed in the subumbilical fascia. All skin incisions were closed using 3-0 Monocryl. The patient tolerated her procedure well. She was awakened from her anesthetic without difficulty and then transported to the recovery room in stable condition. Sponge, needle, and instrument counts were correct on 2 occasions. The estimated blood loss was 225 cc's. 0 Vicryl is the suture material used throughout the procedure. The estimated surgery time was 3 hours and 15 minutes. The uterus was sent to pathology.   Leonard Schwartz, M.D.

## 2012-12-26 NOTE — Transfer of Care (Signed)
Anesthesia Post Note  Patient: Karen Hardin  Procedure(s) Performed: Procedure(s) (LRB): LAPAROSCOPIC ASSISTED VAGINAL HYSTERECTOMY bilateral salpingectomy (N/A) CYSTOSCOPY  Anesthesia type: General  Patient location: [facility]  Post pain: Pain level controlled  Post assessment: Post-op Vital signs reviewed  Last Vitals:  Filed Vitals:   12/26/12 0737  BP: 128/69  Pulse: 79  Temp: 36.4 C  Resp: 20    Post vital signs: Reviewed  Level of consciousness: sedated  Complications: No apparent anesthesia complications

## 2012-12-26 NOTE — Anesthesia Preprocedure Evaluation (Signed)
Anesthesia Evaluation  Patient identified by MRN, date of birth, ID band Patient awake    Reviewed: Allergy & Precautions, H&P , Patient's Chart, lab work & pertinent test results, reviewed documented beta blocker date and time   Airway Mallampati: II TM Distance: >3 FB Neck ROM: full    Dental no notable dental hx.    Pulmonary former smoker,  breath sounds clear to auscultation  Pulmonary exam normal       Cardiovascular Rhythm:regular Rate:Normal     Neuro/Psych    GI/Hepatic   Endo/Other    Renal/GU      Musculoskeletal   Abdominal   Peds  Hematology   Anesthesia Other Findings 'Asthma" with bronchitis, no regular usage  Reproductive/Obstetrics                           Anesthesia Physical Anesthesia Plan  ASA: II  Anesthesia Plan: General   Post-op Pain Management:    Induction: Intravenous  Airway Management Planned: Oral ETT  Additional Equipment:   Intra-op Plan:   Post-operative Plan: Extubation in OR  Informed Consent: I have reviewed the patients History and Physical, chart, labs and discussed the procedure including the risks, benefits and alternatives for the proposed anesthesia with the patient or authorized representative who has indicated his/her understanding and acceptance.   Dental Advisory Given and Dental advisory given  Plan Discussed with: CRNA and Surgeon  Anesthesia Plan Comments: (  Discussed general anesthesia, including possible nausea, instrumentation of airway, sore throat,pulmonary aspiration, etc. I asked if the were any outstanding questions, or  concerns before we proceeded. )        Anesthesia Quick Evaluation

## 2012-12-26 NOTE — Anesthesia Postprocedure Evaluation (Signed)
  Anesthesia Post-op Note  Patient: Karen Hardin  Procedure(s) Performed: Procedure(s): LAPAROSCOPIC ASSISTED VAGINAL HYSTERECTOMY Uterine Morcellation, Bilateral Salpingectomy,  (N/A) CYSTOSCOPY  Patient Location: [facility]  Anesthesia Type:General  Level of Consciousness: awake, alert  and oriented  Airway and Oxygen Therapy: Patient Spontanous Breathing and Patient connected to nasal cannula oxygen  Post-op Pain: none  Post-op Assessment: Post-op Vital signs reviewed and Patient's Cardiovascular Status Stable  Post-op Vital Signs: Reviewed and stable  Complications: No apparent anesthesia complications

## 2012-12-27 ENCOUNTER — Encounter (HOSPITAL_COMMUNITY): Payer: Self-pay | Admitting: *Deleted

## 2012-12-27 LAB — CBC
Platelets: 195 10*3/uL (ref 150–400)
RBC: 2.82 MIL/uL — ABNORMAL LOW (ref 3.87–5.11)
RDW: 16.2 % — ABNORMAL HIGH (ref 11.5–15.5)
WBC: 15.5 10*3/uL — ABNORMAL HIGH (ref 4.0–10.5)

## 2012-12-27 MED ORDER — IBUPROFEN 800 MG PO TABS: 800.0000 mg | ORAL_TABLET | Freq: Three times a day (TID) | ORAL | Status: DC | PRN

## 2012-12-27 MED ORDER — OXYCODONE-ACETAMINOPHEN 5-325 MG PO TABS: 1.0000 | ORAL_TABLET | ORAL | Status: DC | PRN

## 2012-12-27 MED ORDER — FERROUS SULFATE 325 (65 FE) MG PO TABS: 325.0000 mg | ORAL_TABLET | Freq: Two times a day (BID) | ORAL | Status: DC

## 2012-12-27 MED ORDER — DOCUSATE SODIUM 100 MG PO CAPS: 100.0000 mg | ORAL_CAPSULE | Freq: Two times a day (BID) | ORAL | Status: DC

## 2012-12-27 NOTE — Discharge Summary (Addendum)
Physician Discharge Summary  Patient ID: Karen Hardin MRN: [id number] DOB/AGE: 1965-12-18 47 y.o.  Admit date:         12/26/2012 Discharge date: 12/27/2012  Admission Diagnoses:   12-14 week size fibroid uterus  Menorrhagia  Endometriosis  Anemia  History of depression  Prior myomectomy  Stargardt's Disease (legally blind)  Overweight Hypertension Asthma  Discharge Diagnoses:   Same  Procedures this Admission:  12/26/2012  Procedure(s) (LRB): LAPAROSCOPIC ASSISTED VAGINAL HYSTERECTOMY Uterine Morcellation, Bilateral Salpingectomy,  (N/A) CYSTOSCOPY  Discharged Condition: good   Admission Hx and PE: The patient has been followed at the [address] division of [facility]. She has a history of  Fibroids . Please see her documented history and physical exam.   Hospital course:  On the day of admission, the patient underwent the following Procedure(s): LAPAROSCOPIC ASSISTED VAGINAL HYSTERECTOMY Uterine Morcellation, Bilateral Salpingectomy,  CYSTOSCOPY. Operative findings included a 665 g multi-fibroid uterus. Uterine morcellation was required. Cystoscopy demonstrated that both ureters were patent. The patient tolerated her procedure well. Her postoperative course was uneventful. She quickly tolerated a regular diet. Her postoperative pain was controlled with oral medication. She remained afebrile. Today she was felt to be ready for discharge.  Labs:  Hemoglobin  Date Value Range Status  12/19/2012 11.2* 12.0 - 15.0 g/dL Final     HCT  Date Value Range Status  12/19/2012 33.8* 36.0 - 46.0 % Final    Consults: None  Final pathology report: Pending at the time of discharge  Disposition:  The patient will be discharged to home. She has been given a copy of the discharge instructions as prepared by the [facility] of [facility] for patients who have undergone the Procedure(s): LAPAROSCOPIC ASSISTED VAGINAL HYSTERECTOMY  Uterine Morcellation, Bilateral Salpingectomy,  CYSTOSCOPY.   Discharge Orders   Future Orders Complete By Expires   CBC  As directed    Orthostatic vital signs  As directed        Medication List    STOP taking these medications       ferrous fumarate 325 (106 FE) MG Tabs tablet  Commonly known as:  HEMOCYTE - 106 mg FE      TAKE these medications       albuterol 108 (90 BASE) MCG/ACT inhaler  Commonly known as:  PROVENTIL HFA;VENTOLIN HFA  Inhale 2 puffs into the lungs every 4 (four) hours as needed for wheezing. Dispense with aerochamber     aspirin-acetaminophen-caffeine 250-250-65 MG per tablet  Commonly known as:  EXCEDRIN MIGRAINE  Take 2 tablets by mouth every 6 (six) hours as needed for pain.     cyclobenzaprine 10 MG tablet  Commonly known as:  FLEXERIL  Take 10 mg by mouth every 8 (eight) hours as needed.     docusate sodium 100 MG capsule  Commonly known as:  COLACE  Take 1 capsule (100 mg total) by mouth 2 (two) times daily.     ferrous sulfate 325 (65 FE) MG tablet  Commonly known as:  FERROUSUL  Take 1 tablet (325 mg total) by mouth 2 (two) times daily with a meal.     ibuprofen 800 MG tablet  Commonly known as:  ADVIL,MOTRIN  Take 1 tablet (800 mg total) by mouth every 8 (eight) hours as needed.     MAXALT PO  Take 1 tablet by mouth daily.     olopatadine 0.1 % ophthalmic solution  Commonly known as:  PATANOL  Place 1 drop into both  eyes 2 (two) times daily.     OVER THE COUNTER MEDICATION  Apply 1 application topically as needed. Theragesic Cream.  Applying to neck & back.     oxyCODONE-acetaminophen 5-325 MG per tablet  Commonly known as:  ROXICET  Take 1 tablet by mouth every 4 (four) hours as needed for severe pain.           Follow-up Information   Follow up with Janine Limbo, MD In 6 weeks.   Specialty:  Obstetrics and Gynecology   Contact information:   [address] [place] [address] [phone]        Signed: Janine Limbo 12/27/2012, 9:29 AM

## 2012-12-27 NOTE — Progress Notes (Signed)
Discharge instructions reviewed with patient.  Patient states understanding of home care, medications, activity, return MD office visit and signs/symptoms to report to MD.  No home equipment needed.  Patient ambulated for discharge with staff in stable condition without incident.

## 2012-12-29 ENCOUNTER — Encounter (HOSPITAL_COMMUNITY): Payer: Self-pay | Admitting: Obstetrics and Gynecology

## 2013-09-02 ENCOUNTER — Emergency Department (HOSPITAL_COMMUNITY)
Admission: EM | Admit: 2013-09-02 | Discharge: 2013-09-03 | Disposition: A | Discharge status: Home / Self Care | Payer: Medicare Other | Attending: Emergency Medicine | Admitting: Emergency Medicine

## 2013-09-02 ENCOUNTER — Encounter (HOSPITAL_COMMUNITY): Payer: Self-pay | Admitting: Emergency Medicine

## 2013-09-02 DIAGNOSIS — Z8742 Personal history of other diseases of the female genital tract: Secondary | ICD-10-CM | POA: Insufficient documentation

## 2013-09-02 DIAGNOSIS — Z9889 Other specified postprocedural states: Secondary | ICD-10-CM | POA: Insufficient documentation

## 2013-09-02 DIAGNOSIS — Z8669 Personal history of other diseases of the nervous system and sense organs: Secondary | ICD-10-CM | POA: Insufficient documentation

## 2013-09-02 DIAGNOSIS — Z8659 Personal history of other mental and behavioral disorders: Secondary | ICD-10-CM | POA: Insufficient documentation

## 2013-09-02 DIAGNOSIS — Z8739 Personal history of other diseases of the musculoskeletal system and connective tissue: Secondary | ICD-10-CM | POA: Diagnosis not present

## 2013-09-02 DIAGNOSIS — J45909 Unspecified asthma, uncomplicated: Secondary | ICD-10-CM | POA: Insufficient documentation

## 2013-09-02 DIAGNOSIS — Z87891 Personal history of nicotine dependence: Secondary | ICD-10-CM | POA: Insufficient documentation

## 2013-09-02 DIAGNOSIS — M542 Cervicalgia: Secondary | ICD-10-CM | POA: Diagnosis not present

## 2013-09-02 DIAGNOSIS — M62838 Other muscle spasm: Secondary | ICD-10-CM | POA: Diagnosis not present

## 2013-09-02 DIAGNOSIS — Z8619 Personal history of other infectious and parasitic diseases: Secondary | ICD-10-CM | POA: Insufficient documentation

## 2013-09-02 DIAGNOSIS — Z79899 Other long term (current) drug therapy: Secondary | ICD-10-CM | POA: Insufficient documentation

## 2013-09-02 NOTE — ED Notes (Signed)
PT has also used flexeril w/ little relief

## 2013-09-02 NOTE — ED Notes (Signed)
Percocet offered; pt states she is taking that and not working.

## 2013-09-02 NOTE — ED Notes (Addendum)
Pt. reports pain at back of neck radiating to upper back unrelieved by prescription pain medications/muscle relaxant onset last week , denies injury or strenuous activity , pain worse with movement/certain positions.

## 2013-09-02 NOTE — ED Notes (Signed)
PT states she started having neck pain last week and pain has been progressively getting worse and not better since onset. PT describes pain as aching and occasionally sharp with certain movements. PT has hx of degenerative disk disease in her c-spine, but dx not recent. PT has had this type of pain before,but states this is the worst its been and states she's had steroid injections for it before. PT took Percocet 5-325 x 1 at 845PM with mild relief, but still has pain. PT states she will get parasthesia in hands bilaterally (numb/pins/needles) which has happened intermittently for 3-4 days

## 2013-09-02 NOTE — ED Provider Notes (Signed)
CSN: [id number]     Arrival date & time 09/02/13  1849 History   First MD Initiated Contact with Patient 09/02/13 2303     Chief Complaint  Patient presents with  . Neck Pain     (Consider location/radiation/quality/duration/timing/severity/associated sxs/prior Treatment) HPI This patient is a 48 year old woman with a history neck pain. She presents with 2 days of acutely worsened neck pain. Pain is most severe  to the left of midline in the posterior region. It is aching. The patient experiences occasional jolts of sharp pain. Pain is worse with rotation of the neck. The patient denies radiation of pain into the arms. Denies paresthesias as well as motor weakness. She has no history of recent injury or strain of this region.  The patient notes that, at one point, she is followed by Dr. Priscille Loveless who performed epidural steroid injections which were temporarily helpful. Past Medical History  Diagnosis Date  . PONV (postoperative nausea and vomiting) 2002  . 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 Fibroid   . H/O: menorrhagia 09/14/2010  . Endometrial polyp 09/14/2010  . Cyst, breast   . Irregular uterine bleeding 09/14/2010  . Headache(784.0)     migraines  . Trichomonas   . Ovarian cyst   . ASCUS (atypical squamous cells of undetermined significance) on Pap smear   . Fibrocystic breast 2005  . LGSIL (low grade squamous intraepithelial lesion) on Pap smear 2007  . Vaginosis 2008  . 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 Depression     as a child, and stress recently  . Libido, decreased 2010  . Asthma    Past Surgical History  Procedure Laterality Date  . Shoulder arthroscopy w/ rotator cuff repair  2006  . Oophorectomy  2000  . Tubal ligation  2000  . Dilation and curettage of uterus    . Breast cyst  removed     . Breast surgery  1986    biopsy  . Ganglion cyst removed from wrist    . [place]omectomy      In [place]  . Abdominal hysterectomy    . Laparoscopic assisted  vaginal hysterectomy N/A 12/26/2012    Procedure: LAPAROSCOPIC ASSISTED VAGINAL HYSTERECTOMY Uterine Morcellation, Bilateral Salpingectomy, ;  Surgeon: Ena Dawley, MD;  Location: [facility];  Service: Gynecology;  Laterality: N/A;  . Cystoscopy  12/26/2012    Procedure: CYSTOSCOPY;  Surgeon: Ena Dawley, MD;  Location: [facility];  Service: Gynecology;;   Family History  Problem Relation Age of Onset  . Arthritis Mother   . Diabetes Mother   . Cancer Father     lung cancer  . Hyperlipidemia Maternal Grandmother   . Breast cancer Maternal Grandmother    History  Substance Use Topics  . Smoking status: Former Smoker -- 0.25 packs/day for 20 years    Types: Cigarettes    Quit date: 12/07/2012  . Smokeless tobacco: Never Used  . Alcohol Use: Yes     Comment: very rarely - less than once a week   OB History   Grav Para Term Preterm Abortions TAB SAB Ect Mult Living   3 3 3       3      Review of Systems  Ten point review of symptoms performed and is negative with the exception of symptoms noted above.   Allergies  Review of patient's allergies indicates no known allergies.  Home Medications   Prior to Admission medications  Medication Sig Start Date End Date Taking? Authorizing Provider  albuterol (PROVENTIL HFA;VENTOLIN HFA) 108 (90 BASE) MCG/ACT inhaler Inhale 2 puffs into the lungs every 4 (four) hours as needed for wheezing. Dispense with aerochamber 02/01/12  Yes Janne Napoleon, NP  cyclobenzaprine (FLEXERIL) 10 MG tablet Take 10 mg by mouth every 8 (eight) hours as needed for muscle spasms.    Yes Historical Provider, MD  ibuprofen (ADVIL,MOTRIN) 800 MG tablet Take 1 tablet (800 mg total) by mouth every 8 (eight) hours as needed. 12/27/12  Yes Ena Dawley, MD  olopatadine (PATANOL) 0.1 % ophthalmic solution Place 1 drop into both eyes 2 (two) times daily.   Yes Historical Provider, MD  OVER THE COUNTER MEDICATION Apply 1 application topically as needed. Theragesic Cream.   Applying to neck & back.   Yes Historical Provider, MD  oxyCODONE-acetaminophen (ROXICET) 5-325 MG per tablet Take 1 tablet by mouth every 4 (four) hours as needed for severe pain. 12/27/12  Yes Ena Dawley, MD   BP 134/84  Pulse 86  Temp(Src) 98.7 F (37.1 C) (Oral)  Resp 12  Ht 5\' 8"  (1.727 m)  Wt 180 lb (81.647 kg)  BMI 27.38 kg/m2  SpO2 100%  LMP 12/14/2012 Physical Exam Gen: well developed and well nourished appearing Head: NCAT Eyes: PERL, EOMI Nose: no epistaixis or rhinorrhea Mouth/throat: mucosa is moist and pink Neck: No midline tenderness, normal to inspection, tenderness to palpation with palpable muscle spasm left trapezius muscle. Patient resists range of motion testing of the neck secondary to pain. Lungs: CTA B, no wheezing, rhonchi or rales CV: RRR, no murmur, extremities appear well perfused.  Abd: soft, notender, nondistended Back: no ttp, no cva ttp Skin: warm and dry Ext: normal to inspection, no dependent edema Neuro: CN ii-xii grossly intact, no focal deficits, 5 over 5 motor strength in all major muscle groups of both upper extremities, deep tendon reflexes are brisk and symmetric at the brachial radialis tendons bilaterally, sensation is intact to light touch throughout the arms bilaterally. Psyche; normal affect,  calm and cooperative.  ED Course  Procedures (including critical care time) Labs Review   MDM   Acute neck pain with palpable muscle spasm likely secondary to myofascial strain. We have treated the patient with Valium 5 mg IM. She is feeling better and is able to range her neck. She still has lingering pain nausea much better. Will treat with Percocet. The patient is stable for discharge with plan to followup with her primary care physician. Will discharge on Valium and tramadol and naproxen.    Elyn Peers, MD 09/03/13 [phone]

## 2013-09-03 MED ORDER — TRAMADOL HCL 50 MG PO TABS: 50.0000 mg | ORAL_TABLET | Freq: Four times a day (QID) | ORAL | Status: DC | PRN

## 2013-09-03 MED ORDER — DIAZEPAM 5 MG PO TABS: 5.0000 mg | ORAL_TABLET | Freq: Three times a day (TID) | ORAL | Status: DC | PRN

## 2013-09-03 MED ORDER — OXYCODONE-ACETAMINOPHEN 5-325 MG PO TABS
2.0000 | ORAL_TABLET | Freq: Once | ORAL | Status: AC
  Administered 2013-09-03: 2 via ORAL
  Filled 2013-09-03: qty 2

## 2013-09-03 MED ORDER — NAPROXEN 375 MG PO TABS: 375.0000 mg | ORAL_TABLET | Freq: Two times a day (BID) | ORAL | Status: DC

## 2013-09-03 MED ORDER — DIAZEPAM 5 MG/ML IJ SOLN
5.0000 mg | Freq: Once | INTRAMUSCULAR | Status: AC
  Administered 2013-09-03: 5 mg via INTRAVENOUS
  Filled 2013-09-03: qty 2

## 2013-09-03 NOTE — ED Notes (Signed)
Discussed with Dr. Cheri Guppy that patient does not have IV access.  MD gives verbal order for valium 5mg  IM instead of IV.

## 2013-09-03 NOTE — ED Notes (Signed)
Dr. Manly at the bedside.  

## 2013-09-03 NOTE — ED Notes (Signed)
Ride is at the bedside.  Patient informed she is not to drive herself home due to medication.

## 2013-12-14 ENCOUNTER — Encounter (HOSPITAL_COMMUNITY): Payer: Self-pay | Admitting: Emergency Medicine

## 2014-02-12 HISTORY — PX: COLONOSCOPY: SHX174

## 2014-05-03 ENCOUNTER — Ambulatory Visit: Payer: Medicare Other | Admitting: Family Medicine

## 2014-05-11 ENCOUNTER — Encounter: Payer: Self-pay | Admitting: Family Medicine

## 2014-05-11 ENCOUNTER — Telehealth: Payer: Self-pay | Admitting: Family Medicine

## 2014-05-11 ENCOUNTER — Ambulatory Visit (INDEPENDENT_AMBULATORY_CARE_PROVIDER_SITE_OTHER): Payer: Medicare Other | Admitting: Family Medicine

## 2014-05-11 VITALS — BP 130/80 | HR 94 | Temp 98.3°F | Ht 68.0 in | Wt 197.0 lb

## 2014-05-11 DIAGNOSIS — M549 Dorsalgia, unspecified: Secondary | ICD-10-CM

## 2014-05-11 DIAGNOSIS — G43909 Migraine, unspecified, not intractable, without status migrainosus: Secondary | ICD-10-CM | POA: Insufficient documentation

## 2014-05-11 DIAGNOSIS — J45909 Unspecified asthma, uncomplicated: Secondary | ICD-10-CM | POA: Insufficient documentation

## 2014-05-11 DIAGNOSIS — M542 Cervicalgia: Secondary | ICD-10-CM

## 2014-05-11 DIAGNOSIS — J4540 Moderate persistent asthma, uncomplicated: Secondary | ICD-10-CM | POA: Diagnosis not present

## 2014-05-11 DIAGNOSIS — G8929 Other chronic pain: Secondary | ICD-10-CM | POA: Insufficient documentation

## 2014-05-11 DIAGNOSIS — H548 Legal blindness, as defined in USA: Secondary | ICD-10-CM | POA: Diagnosis not present

## 2014-05-11 DIAGNOSIS — G43809 Other migraine, not intractable, without status migrainosus: Secondary | ICD-10-CM

## 2014-05-11 MED ORDER — CYCLOBENZAPRINE HCL 10 MG PO TABS: 10.0000 mg | ORAL_TABLET | Freq: Three times a day (TID) | ORAL | Status: DC | PRN

## 2014-05-11 NOTE — Progress Notes (Signed)
Pre visit review using our clinic review tool, if applicable. No additional management support is needed unless otherwise documented below in the visit note. 

## 2014-05-11 NOTE — Patient Instructions (Signed)
BEFORE YOU LEAVE: -schedule preventive health visit in 3 months  Start the qvar and take twice daily  Please quit smoking - try nicotine lozenges or gum for cravings  Please schedule an appointment with an opthomologist and have them fax your visit notes to [place]  We placed a referral for you as discussed to the specialist for you chronic back pain. It usually takes about 1-2 weeks to process and schedule this referral. If you have not heard from [place] regarding this appointment in 2 weeks please contact our office.

## 2014-05-11 NOTE — Telephone Encounter (Signed)
emmi emailed °

## 2014-05-11 NOTE — Progress Notes (Signed)
HPI:  Karen Hardin is here to establish care. Used to see Dr. Tye Savoy at [facility] but her mother comes here. Last PCP and physical: Sees Dr. Raphael Gibney in gyn for her female exams. S/p hysterectomy.  Has the following chronic problems that require follow up and concerns today:  Asthma: -reports diagnosed with bronchial asthma several years ago -she has not been using her qvar as did not know she was supposed to -she smokes intermittently -she uses her alb a few times per week -denies symptoms today, wheezing, SOB now  DDD of neck and sciatic: -reports she used to see Dr. Assunta Curtis several years ago -reports she has had steroid injections in the past which did help some -reports chronic pain in the L neck, bilat intermittent tingling in hands for years -uses flexeril twice per day and ibuprofen -report has pain pills on had from visits to other clinics but she tries not to use these and prefers not to be on chronic opiods or do surgery -she wants referral to a specialist -denies: worsening, dropping, malaise, weight loss  Migraines: -certain times of the year -reports saw a migraine specialist in the past -she uses Excedrin migraine and occ uses maxalt  Hx of legal blindness: -she has not gone to an eye doctor in a number of years  ROS negative for unless reported above: fevers, unintentional weight loss, hearing loss, chest pain, palpitations, struggling to breath, hemoptysis, melena, hematochezia, hematuria, falls, loc, si, thoughts of self harm  Past Medical History  Diagnosis Date  . PONV (postoperative nausea and vomiting) 2002  . 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 Fibroid   . H/O: menorrhagia 09/14/2010  . Endometrial polyp 09/14/2010  . Cyst, breast   . Headache(784.0)     migraines  . Trichomonas   . Ovarian cyst   . Fibrocystic breast 2005  . LGSIL (low grade squamous intraepithelial lesion) on Pap smear 2007  . Vaginosis 2008  . Stargardt's disease  1986    Patient is legally blind  . Depression     as a child, and stress recently  . Libido, decreased 2010  . Asthma   . Vitamin D deficiency   . Tachycardia     per patient diagnosed in ER 2014    Past Surgical History  Procedure Laterality Date  . Shoulder arthroscopy w/ rotator cuff repair  2006  . Oophorectomy  2000  . Tubal ligation  2000  . Dilation and curettage of uterus    . Breast cyst  removed     . Breast surgery  1986    biopsy  . Ganglion cyst removed from wrist    . Myomectomy      In [place]  . Abdominal hysterectomy    . Laparoscopic assisted vaginal hysterectomy N/A 12/26/2012    Procedure: LAPAROSCOPIC ASSISTED VAGINAL HYSTERECTOMY Uterine Morcellation, Bilateral Salpingectomy, ;  Surgeon: Ena Dawley, MD;  Location: [facility];  Service: Gynecology;  Laterality: N/A;  . Cystoscopy  12/26/2012    Procedure: CYSTOSCOPY;  Surgeon: Ena Dawley, MD;  Location: [facility];  Service: Gynecology;;    Family History  Problem Relation Age of Onset  . Arthritis Mother   . Diabetes Mother   . Cancer Father     lung cancer  . Hyperlipidemia Maternal Grandmother   . Breast cancer Maternal Grandmother     History   Social History  . Marital Status: Married    Spouse Name: N/A  .  Number of Children: N/A  . Years of Education: N/A   Social History Main Topics  . Smoking status: Current Some Day Smoker -- 0.25 packs/day for 20 years    Types: Cigarettes    Last Attempt to Quit: 12/07/2012  . Smokeless tobacco: Never Used  . Alcohol Use: 0.0 oz/week    0 Standard drinks or equivalent per week     Comment: very rarely - less than once a week  . Drug Use: No  . Sexual Activity: Yes    Birth Control/ Protection: Surgical, Condom   Other Topics Concern  . None   Social History Narrative     Current outpatient prescriptions:  .  albuterol (PROVENTIL HFA;VENTOLIN HFA) 108 (90 BASE) MCG/ACT inhaler, Inhale 2 puffs into the lungs every 4 (four) hours  as needed for wheezing. Dispense with aerochamber, Disp: 1 Inhaler, Rfl: 0 .  beclomethasone (QVAR) 80 MCG/ACT inhaler, Inhale into the lungs 2 (two) times daily., Disp: , Rfl:  .  cyclobenzaprine (FLEXERIL) 10 MG tablet, Take 1 tablet (10 mg total) by mouth every 8 (eight) hours as needed for muscle spasms., Disp: 30 tablet, Rfl: 1 .  ibuprofen (ADVIL,MOTRIN) 800 MG tablet, Take 1 tablet (800 mg total) by mouth every 8 (eight) hours as needed., Disp: 50 tablet, Rfl: 1 .  olopatadine (PATANOL) 0.1 % ophthalmic solution, Place 1 drop into both eyes 2 (two) times daily., Disp: , Rfl:  .  OVER THE COUNTER MEDICATION, Apply 1 application topically as needed. Theragesic Cream.  Applying to neck & back., Disp: , Rfl:  .  Rizatriptan Benzoate (MAXALT PO), Take by mouth., Disp: , Rfl:   EXAM:  Filed Vitals:   05/11/14 1355  BP: 130/80  Pulse: 94  Temp: 98.3 F (36.8 C)    Body mass index is 29.96 kg/(m^2).  GENERAL: vitals reviewed and listed above, alert, oriented, appears well hydrated and in no acute distress  HEENT: atraumatic, conjunttiva clear, no obvious abnormalities on inspection of external nose and ears  NECK: no obvious masses on inspection  LUNGS: clear to auscultation bilaterally, no wheezes, rales or rhonchi, good air movement  CV: HRRR, no peripheral edema  MS: moves all extremities without noticeable abnormality  PSYCH: pleasant and cooperative, no obvious depression or anxiety  ASSESSMENT AND PLAN:  Discussed the following assessment and plan:  Asthma, chronic, moderate persistent, uncomplicated -restart qvar as symptoms not well controlled - advised to continue this and use alb prn  Legal blindness -advised re-eval and management with an opthomologist  Chronic neck pain - Plan: Ambulatory referral to Physical Medicine Rehab Chronic back pain - Plan: Ambulatory referral to Physical Medicine Rehab -advised against chronic opiods -she opted for eval with PMR for  possible inj and other no-surgical treatments -also discussed acupuncture and PT  Other migraine without status migrainosus, not intractable -refilled medication    -We reviewed the PMH, PSH, FH, SH, Meds and Allergies. -We provided refills for any medications we will prescribe as needed. -We addressed current concerns per orders and patient instructions. -We have asked for records for pertinent exams, studies, vaccines and notes from previous providers. -We have advised patient to follow up per instructions below.   -Patient advised to return or notify a doctor immediately if symptoms worsen or persist or new concerns arise.  Patient Instructions  BEFORE YOU LEAVE: -schedule preventive health visit in 3 months  Start the qvar and take twice daily  Please quit smoking - try nicotine lozenges or gum  for cravings  Please schedule an appointment with an opthomologist and have them fax your visit notes to [place]  We placed a referral for you as discussed to the specialist for you chronic back pain. It usually takes about 1-2 weeks to process and schedule this referral. If you have not heard from [place] regarding this appointment in 2 weeks please contact our office.        Colin Benton R.

## 2014-05-12 ENCOUNTER — Telehealth: Payer: Self-pay | Admitting: Family Medicine

## 2014-05-12 ENCOUNTER — Other Ambulatory Visit: Payer: Self-pay | Admitting: Family Medicine

## 2014-05-12 MED ORDER — BECLOMETHASONE DIPROPIONATE 80 MCG/ACT IN AERS: 1.0000 | INHALATION_SPRAY | Freq: Two times a day (BID) | RESPIRATORY_TRACT | Status: DC

## 2014-05-12 NOTE — Telephone Encounter (Signed)
Pt said she checked with the pharmacy and they do have the following rx and she is requesting it   Pt request refill of the following: beclomethasone (QVAR) 80 MCG/ACT inhaler   Phamacy: [place]

## 2014-05-18 ENCOUNTER — Telehealth: Payer: Self-pay | Admitting: Family Medicine

## 2014-05-18 NOTE — Telephone Encounter (Signed)
I received a PA request for QVAR and the letter states patient's plan prefers Flovent. Her plan allowed a temporary supply in the mean time.

## 2014-05-19 MED ORDER — FLUTICASONE PROPIONATE HFA 44 MCG/ACT IN AERO: 2.0000 | INHALATION_SPRAY | Freq: Two times a day (BID) | RESPIRATORY_TRACT | Status: DC

## 2014-05-19 NOTE — Telephone Encounter (Signed)
Patient informed. 

## 2014-05-19 NOTE — Telephone Encounter (Signed)
Sent flovent instead to pharmacy. Please advise her to use flonase 2 puffs twice daily instead of the qvar once she has used up the qvar. Thanks.

## 2014-08-09 ENCOUNTER — Encounter: Payer: Self-pay | Admitting: Family Medicine

## 2014-08-12 ENCOUNTER — Other Ambulatory Visit: Payer: Self-pay

## 2014-08-12 DIAGNOSIS — Z1231 Encounter for screening mammogram for malignant neoplasm of breast: Secondary | ICD-10-CM

## 2014-08-13 ENCOUNTER — Encounter: Payer: Self-pay | Admitting: Internal Medicine

## 2014-08-13 ENCOUNTER — Ambulatory Visit (INDEPENDENT_AMBULATORY_CARE_PROVIDER_SITE_OTHER): Payer: Medicare Other | Admitting: Family Medicine

## 2014-08-13 ENCOUNTER — Encounter: Payer: Self-pay | Admitting: Family Medicine

## 2014-08-13 VITALS — BP 112/72 | HR 93 | Temp 98.0°F | Ht 67.75 in | Wt 204.1 lb

## 2014-08-13 DIAGNOSIS — H548 Legal blindness, as defined in USA: Secondary | ICD-10-CM

## 2014-08-13 DIAGNOSIS — Z1211 Encounter for screening for malignant neoplasm of colon: Secondary | ICD-10-CM | POA: Diagnosis not present

## 2014-08-13 DIAGNOSIS — F17200 Nicotine dependence, unspecified, uncomplicated: Secondary | ICD-10-CM

## 2014-08-13 DIAGNOSIS — J4540 Moderate persistent asthma, uncomplicated: Secondary | ICD-10-CM

## 2014-08-13 DIAGNOSIS — E669 Obesity, unspecified: Secondary | ICD-10-CM | POA: Diagnosis not present

## 2014-08-13 DIAGNOSIS — Z720 Tobacco use: Secondary | ICD-10-CM | POA: Diagnosis not present

## 2014-08-13 DIAGNOSIS — M549 Dorsalgia, unspecified: Secondary | ICD-10-CM

## 2014-08-13 DIAGNOSIS — G8929 Other chronic pain: Secondary | ICD-10-CM

## 2014-08-13 DIAGNOSIS — Z0000 Encounter for general adult medical examination without abnormal findings: Secondary | ICD-10-CM | POA: Diagnosis not present

## 2014-08-13 DIAGNOSIS — R229 Localized swelling, mass and lump, unspecified: Secondary | ICD-10-CM

## 2014-08-13 DIAGNOSIS — G43809 Other migraine, not intractable, without status migrainosus: Secondary | ICD-10-CM

## 2014-08-13 DIAGNOSIS — M542 Cervicalgia: Secondary | ICD-10-CM

## 2014-08-13 DIAGNOSIS — Z6831 Body mass index (BMI) 31.0-31.9, adult: Secondary | ICD-10-CM

## 2014-08-13 DIAGNOSIS — E559 Vitamin D deficiency, unspecified: Secondary | ICD-10-CM

## 2014-08-13 LAB — HEMOGLOBIN A1C: HEMOGLOBIN A1C: 6.3 % (ref 4.6–6.5)

## 2014-08-13 LAB — LIPID PANEL
CHOLESTEROL: 158 mg/dL (ref 0–200)
HDL: 46.4 mg/dL (ref >39.00)
LDL Cholesterol: 96 mg/dL (ref 0–99)
NONHDL: 111.6
Total CHOL/HDL Ratio: 3
Triglycerides: 77 mg/dL (ref 0.0–149.0)
VLDL: 15.4 mg/dL (ref 0.0–40.0)

## 2014-08-13 NOTE — Patient Instructions (Addendum)
BEFORE YOU LEAVE: -labs -follow up 6 months  Vit D3 1000 IU daily   -We placed a referral for you as discussed for the colonoscopy and to the surgeon for removal of the bump. It usually takes about 1-2 weeks to process and schedule this referral. If you have not heard from [place] regarding this appointment in 2 weeks please contact our office.  -We have ordered labs or studies at this visit. It can take up to 1-2 weeks for results and processing. We will contact you with instructions IF your results are abnormal. Normal results will be released to your [facility]. If you have not heard from [place] or can not find your results in [facility] in 2 weeks please contact our office.  We recommend the following healthy lifestyle measures: - eat a healthy diet consisting of lots of vegetables, fruits, beans, nuts, seeds, healthy meats such as white chicken and fish and whole grains.  - avoid fried foods, fast food, processed foods, sodas, red meet and other fattening foods.  - get a least 150 minutes of aerobic exercise per week.   We recommend the following healthy lifestyle measures: - eat a healthy diet consisting of lots of vegetables, fruits, beans, nuts, seeds, healthy meats such as white chicken and fish and whole grains.  - avoid fried foods, fast food, processed foods, sodas, red meet and other fattening foods.  - get a least 150 minutes of aerobic exercise per week.

## 2014-08-13 NOTE — Progress Notes (Signed)
Pre visit review using our clinic review tool, if applicable. No additional management support is needed unless otherwise documented below in the visit note. 

## 2014-08-13 NOTE — Progress Notes (Signed)
HPI:  Here for CPE:  -Concerns and/or follow up today:  Asthma: -reports diagnosed with bronchial asthma several years ago -she has not been using her ICS - too expensive -she smokes intermittently -she uses her alb a few times per week -denies symptoms today, wheezing, SOB now -has never been hospitalized for her asthma  Smoking: -had quit with nicotine gum -then restarted the last few weeks -she may try to quit again with this method  DDD of neck and sciatic: -reports she used to see Dr. Assunta Curtis several years ago -reports she has had steroid injections in the past which did help some -reports chronic pain in the L neck, bilat intermittent tingling in hands for years -uses flexeril twice per day and ibuprofen -report has pain pills on had from visits to other clinics but she tries not to use these and prefers not to be on chronic opiods or do surgery -she wants referral to a specialist -denies: worsening, dropping, malaise, weight loss  Migraines: -certain times of the year -reports saw a migraine specialist in the past -she uses Excedrin migraine and occ uses maxalt  Hx of legal blindness: -she has not gone to an eye doctor in a number of years  Bump on R buttock: -for several months -seems to be growing -not really painful -wants referral for removal  -Diet: variety of foods, balance and well rounded, larger portion sizes  -Exercise: no regular exercise  -Taking folic acid, vitamin D or calcium: no  -Diabetes and Dyslipidemia Screening: FASTING  -Hx of HTN: no  -Vaccines: UTD  -pap history: sees gyn, Dr. Raphael Gibney - has mammogram set up and will be scheduling her gyn exam  -FDLMP: n/a s/p hysterectomy  -sexual activity: yes, female partner, no new partners  -wants STI testing (Hep C if born [birth year]): no  -FH breast, colon or ovarian ca: see FH Last mammogram:sees Dr. Raphael Gibney Last colon cancer screening: n/a  Breast Ca Risk Assessment: sees Dr.  Raphael Gibney -SolutionApps.it  Genetic Counseling Screen: Http://www.breastcancergenesscreen.org/startScreen.aspx  -Alcohol, Tobacco, drug use: see social history  Review of Systems - no fevers, unintentional weight loss, vision loss, hearing loss, chest pain, sob, hemoptysis, melena, hematochezia, hematuria, genital discharge, changing or concerning skin lesions, bleeding, bruising, loc, thoughts of self harm or SI  Past Medical History  Diagnosis Date  . Asthma 2002  . 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 Headache(784.0)     migraines  . Trichomonas   . Ovarian cyst   . Fibrocystic breast 2005  . LGSIL (low grade squamous intraepithelial lesion) on Pap smear 2007  . Vaginosis 2008  . 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 Depression     as a child, and stress recently  . Libido, decreased 2010  . Vitamin D deficiency   . Tachycardia     per patient diagnosed in ER 2014    Past Surgical History  Procedure Laterality Date  . Shoulder arthroscopy w/ rotator cuff repair  2006  . Oophorectomy  2000  . Tubal ligation  2000  . Dilation and curettage of uterus    . Breast surgery  1986    biopsy  . Ganglion cyst removed from wrist    . [place]omectomy      In [place]  . Abdominal hysterectomy      fibroid, endometrial polyp, menorrhagia  . Laparoscopic assisted vaginal hysterectomy N/A 12/26/2012    Procedure: LAPAROSCOPIC ASSISTED  VAGINAL HYSTERECTOMY Uterine Morcellation, Bilateral Salpingectomy, ;  Surgeon: Ena Dawley, MD;  Location: [facility];  Service: Gynecology;  Laterality: N/A;  . Cystoscopy  12/26/2012    Procedure: CYSTOSCOPY;  Surgeon: Ena Dawley, MD;  Location: [facility];  Service: Gynecology;;    Family History  Problem Relation Age of Onset  . Arthritis Mother   . Diabetes Mother   . Cancer Father     lung cancer  . Hyperlipidemia Maternal Grandmother   . Breast cancer Maternal Grandmother      History   Social History  . Marital Status: Married    Spouse Name: N/A  . Number of Children: N/A  . Years of Education: N/A   Social History Main Topics  . Smoking status: Current Some Day Smoker -- 0.25 packs/day for 20 years    Types: Cigarettes    Last Attempt to Quit: 12/07/2012  . Smokeless tobacco: Never Used  . Alcohol Use: 0.0 oz/week    0 Standard drinks or equivalent per week     Comment: very rarely - less than once a week  . Drug Use: No  . Sexual Activity: Yes    Birth Control/ Protection: Surgical, Condom   Other Topics Concern  . None   Social History Narrative     Current outpatient prescriptions:  .  albuterol (PROVENTIL HFA;VENTOLIN HFA) 108 (90 BASE) MCG/ACT inhaler, Inhale 2 puffs into the lungs every 4 (four) hours as needed for wheezing. Dispense with aerochamber, Disp: 1 Inhaler, Rfl: 0 .  cyclobenzaprine (FLEXERIL) 10 MG tablet, Take 1 tablet (10 mg total) by mouth every 8 (eight) hours as needed for muscle spasms., Disp: 30 tablet, Rfl: 1 .  fluticasone (FLOVENT HFA) 44 MCG/ACT inhaler, Inhale 2 puffs into the lungs 2 (two) times daily., Disp: 1 Inhaler, Rfl: 12 .  ibuprofen (ADVIL,MOTRIN) 800 MG tablet, Take 1 tablet (800 mg total) by mouth every 8 (eight) hours as needed., Disp: 50 tablet, Rfl: 1 .  olopatadine (PATANOL) 0.1 % ophthalmic solution, Place 1 drop into both eyes 2 (two) times daily., Disp: , Rfl:  .  OVER THE COUNTER MEDICATION, Apply 1 application topically as needed. Theragesic Cream.  Applying to neck & back., Disp: , Rfl:  .  Rizatriptan Benzoate (MAXALT PO), Take by mouth., Disp: , Rfl:   EXAM:  Filed Vitals:   08/13/14 0848  BP: 112/72  Pulse: 93  Temp: 98 F (36.7 C)    Body mass index is 31.26 kg/(m^2).   GENERAL: vitals reviewed and listed below, alert, oriented, appears well hydrated and in no acute distress  HEENT: head atraumatic, PERRLA, normal appearance of eyes, ears, nose and mouth. moist mucus  membranes.  NECK: supple, no masses or lymphadenopathy  LUNGS: clear to auscultation bilaterally, no rales, rhonchi or wheeze  CV: HRRR, no peripheral edema or cyanosis, normal pedal pulses  BREAST: declined - sees gyn  ABDOMEN: bowel sounds normal, soft, non tender to palpation, no masses, no rebound or guarding  GU: declined - sees gyn  RECTAL: refused  SKIN: no rash or abnormal lesions except for 2.4x2cm subcutaneous mass R lower buttock  MS: normal gait, moves all extremities normally  NEURO: CN II-XII grossly intact, normal muscle strength and sensation to light touch on extremities  PSYCH: normal affect, pleasant and cooperative  ASSESSMENT AND PLAN:  Discussed the following assessment and plan:  Visit for preventive health examination - Plan: Hemoglobin A1c, Lipid Panel -Discussed and advised all [place] preventive services health task  force level A and B recommendations for age, sex and risks.  -Advised at least 150 minutes of exercise per week and a healthy diet low in saturated fats and sweets and consisting of fresh fruits and vegetables, lean meats such as fish and white chicken and whole grains.  -FASTING labs, studies and vaccines per orders this encounter   Colon cancer screening - Plan: Ambulatory referral to Gastroenterology - wanted referral for age 60 screening i  Asthma, chronic, moderate persistent, uncomplicated -advised ICS, she will check with insurance and let us know if wants another ICS  Legal blindness  Chronic neck pain Chronic back pain -seeing pain clinic soon  Other migraine without status migrainosus, not intractable -stable  Obesity -lifestyle recs -labs per orders  Tobacco use: -counseled 3-5 minutes -advised to quit and offered help -she has opted to try nicotine gum again as this worked well previously  Subcutaneous mass - Plan: Ambulatory referral to General Surgery -discussed potential etiologies, she desires removal,  referral placced   Orders Placed This Encounter  Procedures  . Hemoglobin A1c  . Lipid Panel  . Ambulatory referral to Gastroenterology    Referral Priority:  Routine    Referral Type:  Consultation    Referral Reason:  Specialty Services Required    Number of Visits Requested:  1  . Ambulatory referral to General Surgery    Referral Priority:  Routine    Referral Type:  Surgical    Referral Reason:  Specialty Services Required    Requested Specialty:  General Surgery    Number of Visits Requested:  1    Patient advised to return to clinic immediately if symptoms worsen or persist or new concerns.  Patient Instructions  BEFORE YOU LEAVE: -labs -follow up 6 months  Vit D3 1000 IU daily   -We placed a referral for you as discussed for the colonoscopy and to the surgeon for removal of the bump. It usually takes about 1-2 weeks to process and schedule this referral. If you have not heard from [place] regarding this appointment in 2 weeks please contact our office.  -We have ordered labs or studies at this visit. It can take up to 1-2 weeks for results and processing. We will contact you with instructions IF your results are abnormal. Normal results will be released to your [facility]. If you have not heard from [place] or can not find your results in [facility] in 2 weeks please contact our office.  We recommend the following healthy lifestyle measures: - eat a healthy diet consisting of lots of vegetables, fruits, beans, nuts, seeds, healthy meats such as white chicken and fish and whole grains.  - avoid fried foods, fast food, processed foods, sodas, red meet and other fattening foods.  - get a least 150 minutes of aerobic exercise per week.   We recommend the following healthy lifestyle measures: - eat a healthy diet consisting of lots of vegetables, fruits, beans, nuts, seeds, healthy meats such as white chicken and fish and whole grains.  - avoid fried foods, fast food, processed foods,  sodas, red meet and other fattening foods.  - get a least 150 minutes of aerobic exercise per week.        Return in about 6 months (around 02/13/2015) for follow up.  Colin Benton R.

## 2014-08-18 ENCOUNTER — Ambulatory Visit
Admission: RE | Admit: 2014-08-18 | Discharge: 2014-08-18 | Disposition: A | Discharge status: Home / Self Care | Payer: Medicare Other | Source: Ambulatory Visit

## 2014-08-18 DIAGNOSIS — Z1231 Encounter for screening mammogram for malignant neoplasm of breast: Secondary | ICD-10-CM

## 2014-08-19 ENCOUNTER — Other Ambulatory Visit: Payer: Self-pay | Admitting: Obstetrics and Gynecology

## 2014-08-19 DIAGNOSIS — R928 Other abnormal and inconclusive findings on diagnostic imaging of breast: Secondary | ICD-10-CM

## 2014-08-23 ENCOUNTER — Telehealth: Payer: Self-pay | Admitting: Family Medicine

## 2014-08-23 DIAGNOSIS — R928 Other abnormal and inconclusive findings on diagnostic imaging of breast: Secondary | ICD-10-CM

## 2014-08-23 NOTE — Telephone Encounter (Signed)
Karen Hardin, please call breast center and find out  what order is needed and place order/s. Thanks.

## 2014-08-23 NOTE — Telephone Encounter (Signed)
Pt had her yearly Mammogram. She was contacted by the Breast Center to come back and have her left breast rechecked. Pt need a referral to have this done.

## 2014-08-24 ENCOUNTER — Other Ambulatory Visit: Payer: Medicare Other

## 2014-08-24 ENCOUNTER — Ambulatory Visit
Admission: RE | Admit: 2014-08-24 | Discharge: 2014-08-24 | Disposition: A | Discharge status: Home / Self Care | Payer: Medicare Other | Source: Ambulatory Visit | Attending: Family Medicine | Admitting: Family Medicine

## 2014-08-24 ENCOUNTER — Other Ambulatory Visit: Payer: Self-pay | Admitting: Family Medicine

## 2014-08-24 DIAGNOSIS — R928 Other abnormal and inconclusive findings on diagnostic imaging of breast: Secondary | ICD-10-CM

## 2014-08-24 DIAGNOSIS — N6012 Diffuse cystic mastopathy of left breast: Secondary | ICD-10-CM | POA: Diagnosis not present

## 2014-08-24 NOTE — Telephone Encounter (Signed)
I called the [facility] and spoke with [facility] and she stated the mammogram showed an abnormality on the left breast and a diagnostic mammo and ultrasound was requested.  Orders were placed and Karen Hardin is aware of this.

## 2014-09-10 ENCOUNTER — Encounter: Payer: Self-pay | Admitting: Family Medicine

## 2014-09-14 ENCOUNTER — Encounter: Payer: Self-pay | Admitting: Physical Medicine & Rehabilitation

## 2014-09-28 ENCOUNTER — Ambulatory Visit (AMBULATORY_SURGERY_CENTER): Payer: Self-pay | Admitting: *Deleted

## 2014-09-28 VITALS — Ht 68.0 in | Wt 207.0 lb

## 2014-09-28 DIAGNOSIS — Z1211 Encounter for screening for malignant neoplasm of colon: Secondary | ICD-10-CM

## 2014-09-28 MED ORDER — SUPREP BOWEL PREP KIT 17.5-3.13-1.6 GM/177ML PO SOLN: 1.0000 | Freq: Once | ORAL | Status: DC

## 2014-09-28 NOTE — Progress Notes (Signed)
Patient denies any allergies to egg or soy products. Patient denies complications with anesthesia/sedation.  Patient denies oxygen use at home and denies diet medications. Emmi instructions for colonoscopy explained and given to patient.  

## 2014-10-13 ENCOUNTER — Encounter: Payer: Medicare Other | Admitting: Internal Medicine

## 2014-10-28 ENCOUNTER — Encounter: Payer: Medicare Other | Admitting: Internal Medicine

## 2014-11-02 ENCOUNTER — Ambulatory Visit: Payer: Medicare Other | Admitting: Physical Medicine & Rehabilitation

## 2014-11-02 ENCOUNTER — Encounter: Payer: Medicare Other | Attending: Physical Medicine & Rehabilitation

## 2014-11-23 ENCOUNTER — Encounter: Payer: Self-pay | Admitting: Internal Medicine

## 2014-11-23 ENCOUNTER — Ambulatory Visit (AMBULATORY_SURGERY_CENTER): Payer: Medicare Other | Admitting: Internal Medicine

## 2014-11-23 VITALS — BP 110/82 | HR 75 | Temp 98.0°F | Resp 14 | Ht 67.0 in | Wt 204.0 lb

## 2014-11-23 DIAGNOSIS — Z1211 Encounter for screening for malignant neoplasm of colon: Secondary | ICD-10-CM | POA: Diagnosis present

## 2014-11-23 DIAGNOSIS — D129 Benign neoplasm of anus and anal canal: Secondary | ICD-10-CM | POA: Diagnosis not present

## 2014-11-23 DIAGNOSIS — D123 Benign neoplasm of transverse colon: Secondary | ICD-10-CM | POA: Diagnosis not present

## 2014-11-23 DIAGNOSIS — D127 Benign neoplasm of rectosigmoid junction: Secondary | ICD-10-CM | POA: Diagnosis not present

## 2014-11-23 DIAGNOSIS — D128 Benign neoplasm of rectum: Secondary | ICD-10-CM | POA: Diagnosis not present

## 2014-11-23 MED ORDER — SODIUM CHLORIDE 0.9 % IV SOLN: 500.0000 mL | INTRAVENOUS | Status: DC

## 2014-11-23 NOTE — Progress Notes (Signed)
A/ox3 pleased with MAC, report to Celia RN 

## 2014-11-23 NOTE — Patient Instructions (Signed)
Discharge instructions given. Handout on polyps. Resume previous medications. YOU HAD AN ENDOSCOPIC PROCEDURE TODAY AT THE [facility]:   Refer to the procedure report that was given to you for any specific questions about what was found during the examination.  If the procedure report does not answer your questions, please call your gastroenterologist to clarify.  If you requested that your care partner not be given the details of your procedure findings, then the procedure report has been included in a sealed envelope for you to review at your convenience later.  YOU SHOULD EXPECT: Some feelings of bloating in the abdomen. Passage of more gas than usual.  Walking can help get rid of the air that was put into your GI tract during the procedure and reduce the bloating. If you had a lower endoscopy (such as a colonoscopy or flexible sigmoidoscopy) you may notice spotting of blood in your stool or on the toilet paper. If you underwent a bowel prep for your procedure, you may not have a normal bowel movement for a few days.  Please Note:  You might notice some irritation and congestion in your nose or some drainage.  This is from the oxygen used during your procedure.  There is no need for concern and it should clear up in a day or so.  SYMPTOMS TO REPORT IMMEDIATELY:   Following lower endoscopy (colonoscopy or flexible sigmoidoscopy):  Excessive amounts of blood in the stool  Significant tenderness or worsening of abdominal pains  Swelling of the abdomen that is new, acute  Fever of 100F or higher   For urgent or emergent issues, a gastroenterologist can be reached at any hour by calling [phone].   DIET: Your first meal following the procedure should be a small meal and then it is ok to progress to your normal diet. Heavy or fried foods are harder to digest and may make you feel nauseous or bloated.  Likewise, meals heavy in dairy and vegetables can increase bloating.  Drink  plenty of fluids but you should avoid alcoholic beverages for 24 hours.  ACTIVITY:  You should plan to take it easy for the rest of today and you should NOT DRIVE or use heavy machinery until tomorrow (because of the sedation medicines used during the test).    FOLLOW UP: Our staff will call the number listed on your records the next business day following your procedure to check on you and address any questions or concerns that you may have regarding the information given to you following your procedure. If we do not reach you, we will leave a message.  However, if you are feeling well and you are not experiencing any problems, there is no need to return our call.  We will assume that you have returned to your regular daily activities without incident.  If any biopsies were taken you will be contacted by phone or by letter within the next 1-3 weeks.  Please call us at [phone] if you have not heard about the biopsies in 3 weeks.    SIGNATURES/CONFIDENTIALITY: You and/or your care partner have signed paperwork which will be entered into your electronic medical record.  These signatures attest to the fact that that the information above on your After Visit Summary has been reviewed and is understood.  Full responsibility of the confidentiality of this discharge information lies with you and/or your care-partner. 

## 2014-11-23 NOTE — Progress Notes (Signed)
Called to room to assist during endoscopic procedure.  Patient ID and intended procedure confirmed with present staff. Received instructions for my participation in the procedure from the performing physician.  

## 2014-11-23 NOTE — Op Note (Signed)
[place]  [address] [place], [id number]   COLONOSCOPY PROCEDURE REPORT  PATIENT: Karen, Hardin  MR#: [id number] BIRTHDATE: 04/30/1965 , 49  yrs. old GENDER: female ENDOSCOPIST: Jerene Bears, MD REFERRED JO:ITGPQD Maudie Mercury, D.O. PROCEDURE DATE:  11/23/2014 PROCEDURE:   Colonoscopy, screening and Colonoscopy with snare polypectomy First Screening Colonoscopy - Avg.  risk and is 50 yrs.  old or older Yes.  Prior Negative Screening - Now for repeat screening. N/A  History of Adenoma - Now for follow-up colonoscopy & has been > or = to 3 yrs.  N/A  Polyps removed today? Yes ASA CLASS:   Class II INDICATIONS:Screening for colonic neoplasia and Colorectal Neoplasm Risk Assessment for this procedure is average risk. MEDICATIONS: Monitored anesthesia care and Propofol 300 mg IV  DESCRIPTION OF PROCEDURE:   After the risks benefits and alternatives of the procedure were thoroughly explained, informed consent was obtained.  The digital rectal exam revealed no rectal mass.   The LB [id number]  endoscope was introduced through the anus and advanced to the terminal ileum which was intubated for a short distance. No adverse events experienced.   The quality of the prep was good.  (Suprep was used)  The instrument was then slowly withdrawn as the colon was fully examined. Estimated blood loss is zero unless otherwise noted in this procedure report.  COLON FINDINGS: The examined terminal ileum appeared to be normal. Three sessile polyps ranging between 3-4mm in size with mucous caps were found in the transverse colon (2) and rectosigmoid colon (1). Polypectomies were performed with a cold snare.  The resection was complete, the polyp tissue was completely retrieved and sent to histology.   The examination was otherwise normal.  Retroflexed views revealed internal hemorrhoids. The time to cecum = 2.9 Withdrawal time = 15.1   The scope was withdrawn and the  procedure completed. COMPLICATIONS: There were no immediate complications.  ENDOSCOPIC IMPRESSION: 1.   The examined terminal ileum appeared to be normal 2.   Three sessile polyps ranging between 3-50mm in size were found in the transverse colon and rectosigmoid colon; polypectomies were performed with a cold snare 3.   The examination was otherwise normal  RECOMMENDATIONS: 1.  Await pathology results 2.  Timing of repeat colonoscopy will be determined by pathology findings. 3.  You will receive a letter within 1-2 weeks with the results of your biopsy as well as final recommendations.  Please call my office if you have not received a letter after 3 weeks.  eSigned:  Jerene Bears, MD 11/23/2014 2:37 PM   cc:  the patient, PCP

## 2014-11-24 ENCOUNTER — Telehealth: Payer: Self-pay | Admitting: *Deleted

## 2014-11-24 NOTE — Telephone Encounter (Signed)
  Follow up Call-  Call back number 11/23/2014  Post procedure Call Back phone  # [phone]  Permission to leave phone message Yes     Patient questions:  Do you have a fever, pain , or abdominal swelling? No. Pain Score  0 *  Have you tolerated food without any problems? Yes.    Have you been able to return to your normal activities? Yes.    Do you have any questions about your discharge instructions: Diet   No. Medications  No. Follow up visit  No.  Do you have questions or concerns about your Care? No.  Actions: * If pain score is 4 or above: No action needed, pain <4.

## 2014-11-29 ENCOUNTER — Encounter: Payer: Self-pay | Admitting: Internal Medicine

## 2015-03-08 ENCOUNTER — Encounter: Payer: Self-pay | Admitting: Family Medicine

## 2015-03-08 ENCOUNTER — Ambulatory Visit (INDEPENDENT_AMBULATORY_CARE_PROVIDER_SITE_OTHER): Payer: PPO | Admitting: Family Medicine

## 2015-03-08 VITALS — BP 112/80 | HR 84 | Temp 98.0°F | Ht 67.0 in | Wt 211.9 lb

## 2015-03-08 DIAGNOSIS — F17200 Nicotine dependence, unspecified, uncomplicated: Secondary | ICD-10-CM

## 2015-03-08 DIAGNOSIS — Z23 Encounter for immunization: Secondary | ICD-10-CM | POA: Diagnosis not present

## 2015-03-08 DIAGNOSIS — M542 Cervicalgia: Secondary | ICD-10-CM | POA: Diagnosis not present

## 2015-03-08 DIAGNOSIS — R0789 Other chest pain: Secondary | ICD-10-CM

## 2015-03-08 DIAGNOSIS — G8929 Other chronic pain: Secondary | ICD-10-CM

## 2015-03-08 DIAGNOSIS — M25562 Pain in left knee: Secondary | ICD-10-CM | POA: Diagnosis not present

## 2015-03-08 NOTE — Patient Instructions (Signed)
BEFORE YOU LEAVE: -EKG -patellofem syndrome exercises -follow up in 1 month  Do the exercises for the knee 3-4 days per week  CONGRATULATIONS on quitting smoking! If cravings please use nicotine gum or lozenge. Follow up if having difficulty quitting  -We placed a referral for you as discussed. It usually takes about 1-2 weeks to process and schedule this referral. If you have not heard from [place] regarding this appointment in 2 weeks please contact our office.  -please call your pain specialist to reschedule your appointment regarding your chronic nexk pain

## 2015-03-08 NOTE — Progress Notes (Signed)
Pre visit review using our clinic review tool, if applicable. No additional management support is needed unless otherwise documented below in the visit note. 

## 2015-03-08 NOTE — Progress Notes (Signed)
HPI:  Acute visit for a number of issues:  L knee pain: -started 2 months ago -no inciting event that she can think of, has been walking more -pain around knee cap on L -occ popping -denies: weakness, numbness, malaise, fevers -better with ice and topical sports creams, worse with stairs and on feet for a long time  Atypical CP: -intermittent brief spells of L lateral chest wall pain -can occur at rest or with activity, no accompanied symptoms with events, breif -DOE  with walking up full set of bleacher steps but not with her regular walking and had no CP with this -reports quit smoking the last few days - very difficult -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  Past Medical History  Diagnosis Date  . Asthma 2002  . 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 Headache(784.0)     migraines - otc med prn, then maxalt if needed  . Trichomonas   . Ovarian cyst   . Fibrocystic breast 2005  . LGSIL (low grade squamous intraepithelial lesion) on Pap smear 2007  . Vaginosis 2008  . Stargardt's disease 1986    Patient is legally blind, uses eye drops  . Depression     as a child, and stress recently  . Libido, decreased 2010  . Vitamin D deficiency   . Tachycardia     per patient diagnosed in ER 2014, no problems currently  . Anemia     Past Surgical History  Procedure Laterality Date  . Shoulder arthroscopy w/ rotator cuff repair  2006    right  . Oophorectomy  2000    laparotomy in [place]  . Tubal ligation  2000  . Dilation and curettage of uterus    . Breast surgery  1986    biopsy - benign  . Ganglion cyst removed from wrist      left  . Myomectomy       In [place] - laparotomy  . Laparoscopic assisted vaginal hysterectomy N/A 12/26/2012    Procedure: LAPAROSCOPIC ASSISTED VAGINAL HYSTERECTOMY Uterine Morcellation, Bilateral Salpingectomy, ;  Surgeon: Ena Dawley, MD;  Location: [facility];  Service: Gynecology;  Laterality: N/A;  . Cystoscopy  12/26/2012    Procedure: CYSTOSCOPY;  Surgeon: Ena Dawley, MD;  Location: [facility];  Service: Gynecology;;  . Tonsillectomy and adenoidectomy    . Wisdom tooth extraction      x 2 teeth    Family History  Problem Relation Age of Onset  . Arthritis Mother   . Diabetes Mother   . Cancer Father     lung cancer  . Heart disease Father   . Hyperlipidemia Maternal Grandmother   . Breast cancer Maternal Grandmother   . Colitis Paternal Aunt     in 52's    Social History   Social History  . Marital Status: Married    Spouse Name: N/A  . Number of Children: N/A  . Years of Education: N/A   Social History Main Topics  . Smoking status: Current Some Day Smoker -- 0.10 packs/day for 20 years  . Smokeless tobacco: Never Used  Comment: patient uses nicotine gum   . Alcohol Use: No  . Drug Use: No  . Sexual Activity: Yes    Birth Control/ Protection: Surgical, Condom   Other Topics Concern  . None   Social History Narrative     Current outpatient prescriptions:  .  albuterol (PROVENTIL HFA;VENTOLIN HFA) 108 (90 BASE) MCG/ACT inhaler, Inhale 2 puffs into the lungs every 4 (four) hours as needed for wheezing. Dispense with aerochamber, Disp: 1 Inhaler, Rfl: 0 .  cyclobenzaprine (FLEXERIL) 10 MG tablet, Take 1 tablet (10 mg total) by mouth every 8 (eight) hours as needed for muscle spasms., Disp: 30 tablet, Rfl: 1 .  fluticasone (FLOVENT HFA) 44 MCG/ACT inhaler, Inhale 2 puffs into the lungs 2 (two) times daily., Disp: 1 Inhaler, Rfl: 12 .  ibuprofen (ADVIL,MOTRIN) 800 MG tablet, Take 1 tablet (800 mg total) by mouth every 8 (eight) hours as needed., Disp: 50 tablet, Rfl: 1 .   olopatadine (PATANOL) 0.1 % ophthalmic solution, Place 1 drop into both eyes 2 (two) times daily., Disp: , Rfl:  .  OVER THE COUNTER MEDICATION, Apply 1 application topically as needed. Theragesic Cream.  Applying to neck & back., Disp: , Rfl:  .  oxyCODONE-acetaminophen (PERCOCET) 10-325 MG tablet, Take 1 tablet by mouth every 4 (four) hours as needed for pain., Disp: , Rfl:  .  Rizatriptan Benzoate (MAXALT PO), Take by mouth., Disp: , Rfl:   EXAM:  Filed Vitals:   03/08/15 1357  BP: 112/80  Pulse: 84  Temp: 98 F (36.7 C)    Body mass index is 33.18 kg/(m^2).  GENERAL: vitals reviewed and listed above, alert, oriented, appears well hydrated and in no acute distress  HEENT: atraumatic, conjunttiva clear, no obvious abnormalities on inspection of external nose and ears  NECK: no obvious masses on inspection  LUNGS: clear to auscultation bilaterally, no wheezes, rales or rhonchi, good air movement  CV: HRRR, no peripheral edema  CHEST: TTP in soft tissues, lower ribs L lateral chest wall  MS: moves all extremities without noticeable abnormality, gait normal, normal appearance of knees, TTP around L knee cap, weak VM L, +J sign, no jt line TTP, neg lachman, neg mcmurry, neg drawer, NV intact distal  PSYCH: pleasant and cooperative, no obvious depression or anxiety  ASSESSMENT AND PLAN:  Discussed the following assessment and plan: > 40 minutes spent with this patient with > 50% spent face to face in consultation.  Lateral knee pain, left -suspect PFS, and possible OA -start with HEP, Tyelnol (not in combo with pain meds), ice, topical sports creams and follow up in 1 month  Chest pain, atypical - Plan: DG Chest 2 View, Exercise Tolerance Test -we discussed possible serious and likely etiologies, workup and treatment, treatment risks and return precautions - reproduced on exam so more likely musculoskeletal and advised stretching and heat for this, CXR and various options for  cardiac testing to exclude other -EKG with NSR, biphasic qrs v1/v82minor TW abnormalities -after this discussion, Karen Hardin opted for stretching heat,  CXR, cardiology eval given her family hx and her concerns -follow up advised in 1 month -of course, we advised Karen Hardin  to return or notify a doctor immediately if symptoms worsen or persist or new concerns arise.  Tobacco use disorder - Plan: DG Chest 2 View -congratulated on smoking cessation, supported, counseled <3 minutes, advised nicotine gum or lozenge if cravings, close follow up and consider other medications if needed  Chronic neck pain -advised she reschedule  specialist eval/tx; she agreed  -Patient advised to return or notify a doctor immediately if symptoms worsen or persist or new concerns arise.  Patient Instructions  BEFORE YOU LEAVE: -EKG -patellofem syndrome exercises -follow up in 1 month  Do the exercises for the knee 3-4 days per week  CONGRATULATIONS on quitting smoking! If cravings please use nicotine gum or lozenge. Follow up if having difficulty quitting  -We placed a referral for you as discussed. It usually takes about 1-2 weeks to process and schedule this referral. If you have not heard from [place] regarding this appointment in 2 weeks please contact our office.  -please call your pain specialist to reschedule your appointment regarding your chronic nexk pain     Karen Altemose R.

## 2015-03-16 ENCOUNTER — Encounter: Payer: Self-pay | Admitting: Physician Assistant

## 2015-03-16 NOTE — Progress Notes (Signed)
Cardiology Office Note Date:  03/17/2015  Patient ID:  Karen Hardin, Karen Hardin 1965/05/17, MRN [id number] PCP:  Lucretia Kern., DO  Cardiologist: New to Dr. Marlou Porch  Chief Complaint: chest pain, SOB, palpitations  History of Present Illness: Karen Hardin is a 50 y.o. female with history of migraines, asthma, arthritis, obesity, Stargardt's disease, anemia, tobacco abuse who presents for new patient evaluation of chest pain, SOB, and palpitations.  1) Chest pain - this is localizable in the left axillary area (no anterior chest pain). It has been present for the last month, occurring 3-4x per week without particular trigger. It tends to last seconds at a time. One episode did last 10-15 minutes then resolved spontaneously. She began exercising 2 weeks ago and has not had any discomfort with exertion. It is not worse with inspiration or palpation. She also has associated left arm numbness when this occurs. She has long history of neck issues and has wondered if this is related.  2) SOB - present for the last several months, decribed as DOE. She had an episode a few months ago where she walked up a full set of bleachers and felt extremely out of breath. She started exercising 2 weeks ago - she has started using her inhaler periodically during exercise with improvement in dyspnea. No LEE, LE erythema, cough. She stopped smoking last week.  3) Palpitations - present for several years. This happens a little less than once a week ("50x/yr"). Described as sensation of her heart racing quickly. Does not seem temporally related to #1 or #2 (occurs independently).  She quit smoking last week after 20 years of tobacco abuse. Father had 2 MIs before age 53; died of lung CA. Mother is in poor health - had cardiac arrest in her 65s in the setting of blood clots to her lungs. She has siblings in good health without heart problems.   Past Medical History  Diagnosis Date  . Asthma 2002  . 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 Migraines     migraines - otc med prn, then maxalt if needed  . Trichomonas   . Ovarian cyst   . Fibrocystic breast 2005  . LGSIL (low grade squamous intraepithelial lesion) on Pap smear 2007  . Vaginosis 2008  . 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 Depression     as a child, and stress recently  . Libido, decreased 2010  . Vitamin D deficiency   . Tachycardia     per patient diagnosed in ER 2014, no problems currently  . Anemia     Past Surgical History  Procedure Laterality Date  . Shoulder arthroscopy w/ rotator cuff repair  2006    right  . Oophorectomy  2000    laparotomy in [place]  . Tubal ligation  2000  . Dilation and curettage of uterus    . Breast surgery  1986    biopsy - benign  . Ganglion cyst removed from wrist      left  . Myomectomy      In [place] - laparotomy  . Laparoscopic assisted vaginal hysterectomy N/A 12/26/2012    Procedure: LAPAROSCOPIC ASSISTED VAGINAL HYSTERECTOMY Uterine Morcellation, Bilateral Salpingectomy, ;  Surgeon: Ena Dawley, MD;  Location: [facility];  Service: Gynecology;  Laterality: N/A;  . Cystoscopy  12/26/2012    Procedure: CYSTOSCOPY;  Surgeon: Ena Dawley, MD;  Location: [facility];  Service: Gynecology;;  .  Tonsillectomy and adenoidectomy    . Wisdom tooth extraction      x 2 teeth    Current Outpatient Prescriptions  Medication Sig Dispense Refill  . albuterol (PROVENTIL HFA;VENTOLIN HFA) 108 (90 BASE) MCG/ACT inhaler Inhale 2 puffs into the lungs every 4 (four) hours as needed for wheezing. Dispense with aerochamber 1 Inhaler 0  . cyclobenzaprine (FLEXERIL) 10 MG tablet Take 1 tablet (10 mg total) by mouth every 8 (eight) hours as needed for muscle spasms. 30 tablet 1  . fluticasone (FLOVENT HFA) 44 MCG/ACT inhaler Inhale 2 puffs into the lungs 2 (two) times daily. 1 Inhaler 12  . ibuprofen (ADVIL,MOTRIN) 800 MG tablet Take 1 tablet (800 mg total) by  mouth every 8 (eight) hours as needed. 50 tablet 1  . olopatadine (PATANOL) 0.1 % ophthalmic solution Place 1 drop into both eyes 2 (two) times daily.    Marland Kitchen OVER THE COUNTER MEDICATION Apply 1 application topically as needed. Theragesic Cream.  Applying to neck & back.    Marland Kitchen oxyCODONE-acetaminophen (PERCOCET) 10-325 MG tablet Take 1 tablet by mouth every 4 (four) hours as needed for pain.    . Rizatriptan Benzoate (MAXALT PO) Take by mouth.     No current facility-administered medications for this visit.    Allergies:   Review of patient's allergies indicates no known allergies.   Social History:  The patient  reports that she has quit smoking. She started smoking about 2 weeks ago. She has never used smokeless tobacco. She reports that she does not drink alcohol or use illicit drugs.   Family History:  The patient's family history includes Arthritis in her mother; Breast cancer in her maternal grandmother; Cancer in her father; Colitis in her paternal aunt; Diabetes in her mother; Heart attack in her father, paternal aunt, and paternal uncle; Heart disease in her father; Hyperlipidemia in her maternal grandmother; Hypertension in her mother; Pulmonary embolism in her mother; Stroke in her paternal aunt.  ROS:  Please see the history of present illness. She has noticed gradual weight gain. All other systems are reviewed and otherwise negative.   PHYSICAL EXAM:  VS:  BP 128/88 mmHg  Pulse 88  Ht 5\' 7"  (1.702 m)  Wt 213 lb (96.616 kg)  BMI 33.35 kg/m2  SpO2 99%  LMP 12/14/2012 BMI: Body mass index is 33.35 kg/(m^2). 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  08/13/2014: Cholesterol 158; HDL 46.40; LDL Cholesterol 96; Total CHOL/HDL Ratio 3; Triglycerides 77.0; VLDL 15.4   CrCl cannot be calculated (Patient has no serum creatinine result on file.).   Wt Readings from Last 3 Encounters:  03/17/15 213 lb (96.616 kg)  03/08/15 211 lb 14.4 oz (96.117 kg)  11/23/14 204 lb (92.534 kg)     Other studies reviewed: Additional studies/records reviewed today include: summarized above  ASSESSMENT AND PLAN:  The patient was seen and examined by myself  and Dr. Marlou Porch since she was a new patient.  1. Chest pain/SOB - mostly atypical symptoms. Will proceed with POET given history of tobacco abuse and family history of CAD. She has no signs or symptoms to suggest PE on exam. EKG is unchanged compared to 2013. 2. Palpitations - will obtain 30-day event monitor to further evaluate. Will also check baseline labs to exclude obvious metabolic abnormality causing this symptom. 3. Tobacco abuse - congratulated her on this amazing accomplishment and encouraged continued abstinence. 4. Asthma - not wheezing on exam. She has been able to exercise recently without overt exacerbation. I asked her to bring her inhaler with her to her stress test in case it is needed.  Disposition: F/u with Dr. Marlou Porch or APP in 6 weeks. If cardiac testing is normal she may not require further f/u.  Current medicines are reviewed at length with the patient today.  The patient did not have any concerns regarding medicines.  Raechel Ache PA-C 03/17/2015 11:45 AM     [facility] [address] [place] [address] [phone] (office)  [phone] (fax)

## 2015-03-17 ENCOUNTER — Encounter: Payer: Self-pay | Admitting: Physician Assistant

## 2015-03-17 ENCOUNTER — Ambulatory Visit (INDEPENDENT_AMBULATORY_CARE_PROVIDER_SITE_OTHER): Payer: PPO | Admitting: Physician Assistant

## 2015-03-17 VITALS — BP 128/88 | HR 88 | Ht 67.0 in | Wt 213.0 lb

## 2015-03-17 DIAGNOSIS — R079 Chest pain, unspecified: Secondary | ICD-10-CM

## 2015-03-17 DIAGNOSIS — Z720 Tobacco use: Secondary | ICD-10-CM | POA: Diagnosis not present

## 2015-03-17 DIAGNOSIS — R0602 Shortness of breath: Secondary | ICD-10-CM

## 2015-03-17 DIAGNOSIS — J45909 Unspecified asthma, uncomplicated: Secondary | ICD-10-CM

## 2015-03-17 DIAGNOSIS — R002 Palpitations: Secondary | ICD-10-CM | POA: Diagnosis not present

## 2015-03-17 LAB — BASIC METABOLIC PANEL
BUN: 6 mg/dL — AB (ref 7–25)
CO2: 24 mmol/L (ref 20–31)
CREATININE: 0.67 mg/dL (ref 0.50–1.05)
Calcium: 9.2 mg/dL (ref 8.6–10.4)
Chloride: 105 mmol/L (ref 98–110)
Glucose, Bld: 95 mg/dL (ref 65–99)
POTASSIUM: 4.4 mmol/L (ref 3.5–5.3)
Sodium: 141 mmol/L (ref 135–146)

## 2015-03-17 LAB — TSH: TSH: 0.514 u[IU]/mL (ref 0.350–4.500)

## 2015-03-17 LAB — CBC
HCT: 39.3 % (ref 36.0–46.0)
Hemoglobin: 12.7 g/dL (ref 12.0–15.0)
MCH: 28.2 pg (ref 26.0–34.0)
MCHC: 32.3 g/dL (ref 30.0–36.0)
MCV: 87.3 fL (ref 78.0–100.0)
MPV: 9.9 fL (ref 8.6–12.4)
PLATELETS: 365 10*3/uL (ref 150–400)
RBC: 4.5 MIL/uL (ref 3.87–5.11)
RDW: 13.5 % (ref 11.5–15.5)
WBC: 8.6 10*3/uL (ref 4.0–10.5)

## 2015-03-17 LAB — MAGNESIUM: MAGNESIUM: 1.8 mg/dL (ref 1.5–2.5)

## 2015-03-17 NOTE — Patient Instructions (Signed)
Medication Instructions:  None  Labwork: BMET, Magnesium, CBC and TSH today.  Testing/Procedures:  Your physician has requested that you have an exercise tolerance test. For further information please visit [email or web address]. Please also follow instruction sheet, as given.  Your physician has recommended that you wear an event monitor. Event monitors are medical devices that record the heart's electrical activity. Doctors most often [place] these monitors to diagnose arrhythmias. Arrhythmias are problems with the speed or rhythm of the heartbeat. The monitor is a small, portable device. You can wear one while you do your normal daily activities. This is usually used to diagnose what is causing palpitations/syncope (passing out).   Follow-Up: Your physician recommends that you schedule a follow-up appointment in: 6 weeks with Dr. Marlou Porch or an extender.    Any Other Special Instructions Will Be Listed Below (If Applicable).     If you need a refill on your cardiac medications before your next appointment, please call your pharmacy.

## 2015-03-24 ENCOUNTER — Ambulatory Visit (INDEPENDENT_AMBULATORY_CARE_PROVIDER_SITE_OTHER)
Admission: RE | Admit: 2015-03-24 | Discharge: 2015-03-24 | Disposition: A | Discharge status: Home / Self Care | Payer: PPO | Source: Ambulatory Visit | Attending: Family Medicine | Admitting: Family Medicine

## 2015-03-24 DIAGNOSIS — F17200 Nicotine dependence, unspecified, uncomplicated: Secondary | ICD-10-CM

## 2015-03-24 DIAGNOSIS — R0789 Other chest pain: Secondary | ICD-10-CM | POA: Diagnosis not present

## 2015-03-24 DIAGNOSIS — R0602 Shortness of breath: Secondary | ICD-10-CM | POA: Diagnosis not present

## 2015-04-04 ENCOUNTER — Telehealth: Payer: Self-pay | Admitting: Family Medicine

## 2015-04-04 DIAGNOSIS — M25562 Pain in left knee: Principal | ICD-10-CM

## 2015-04-04 DIAGNOSIS — G8929 Other chronic pain: Secondary | ICD-10-CM

## 2015-04-04 NOTE — Telephone Encounter (Signed)
Patient informed of the message below and states the problem is her left knee.

## 2015-04-04 NOTE — Telephone Encounter (Signed)
Pt has follow up appt for her knee on Friday.  After doing the exercises Dr Maudie Mercury gave her, knee is not better. Knee is swollen. Pt would like to know if she can get xray prior to appt so she does not have to pay 2 copays and wait on results

## 2015-04-04 NOTE — Telephone Encounter (Signed)
Please call pt. Sorry not better. Please confirm L knee pain. I placed order and she can go get at [facility]. Thanks.

## 2015-04-05 ENCOUNTER — Other Ambulatory Visit: Payer: Self-pay | Admitting: Physician Assistant

## 2015-04-05 ENCOUNTER — Ambulatory Visit (INDEPENDENT_AMBULATORY_CARE_PROVIDER_SITE_OTHER)
Admission: RE | Admit: 2015-04-05 | Discharge: 2015-04-05 | Disposition: A | Discharge status: Home / Self Care | Payer: PPO | Source: Ambulatory Visit | Attending: Family Medicine | Admitting: Family Medicine

## 2015-04-05 DIAGNOSIS — M25562 Pain in left knee: Secondary | ICD-10-CM | POA: Diagnosis not present

## 2015-04-05 DIAGNOSIS — R0602 Shortness of breath: Secondary | ICD-10-CM

## 2015-04-05 DIAGNOSIS — R002 Palpitations: Secondary | ICD-10-CM

## 2015-04-05 DIAGNOSIS — G8929 Other chronic pain: Secondary | ICD-10-CM

## 2015-04-05 DIAGNOSIS — M7989 Other specified soft tissue disorders: Secondary | ICD-10-CM | POA: Diagnosis not present

## 2015-04-06 ENCOUNTER — Ambulatory Visit (INDEPENDENT_AMBULATORY_CARE_PROVIDER_SITE_OTHER): Payer: PPO

## 2015-04-06 ENCOUNTER — Encounter: Payer: PPO | Admitting: Nurse Practitioner

## 2015-04-06 DIAGNOSIS — R002 Palpitations: Secondary | ICD-10-CM | POA: Diagnosis not present

## 2015-04-06 DIAGNOSIS — R0602 Shortness of breath: Secondary | ICD-10-CM

## 2015-04-06 DIAGNOSIS — R079 Chest pain, unspecified: Secondary | ICD-10-CM

## 2015-04-07 ENCOUNTER — Telehealth: Payer: Self-pay | Admitting: *Deleted

## 2015-04-07 LAB — EXERCISE TOLERANCE TEST
Estimated workload: 7 METS
Exercise duration (min): 6 min
Exercise duration (sec): 0 s
MPHR: 170 {beats}/min
Peak BP: 209 mmHg
Peak HR: 171 {beats}/min
Percent HR: 100 %
Percent of predicted max HR: 100 %
RPE: 15
Rest HR: 84 {beats}/min
Stage 1 DBP: 76 mmHg
Stage 1 Grade: 0 %
Stage 1 HR: 87 {beats}/min
Stage 1 SBP: 136 mmHg
Stage 1 Speed: 0 mph
Stage 2 Grade: 0 %
Stage 2 HR: 96 {beats}/min
Stage 2 Speed: 1 mph
Stage 3 Grade: 0.1 %
Stage 3 HR: 96 {beats}/min
Stage 3 Speed: 1 mph
Stage 4 DBP: 104 mmHg
Stage 4 Grade: 10 %
Stage 4 HR: 150 {beats}/min
Stage 4 SBP: 180 mmHg
Stage 4 Speed: 1.7 mph
Stage 5 DBP: 114 mmHg
Stage 5 Grade: 12 %
Stage 5 HR: 171 {beats}/min
Stage 5 SBP: 209 mmHg
Stage 5 Speed: 2.5 mph
Stage 6 DBP: 100 mmHg
Stage 6 Grade: 0 %
Stage 6 HR: 134 {beats}/min
Stage 6 SBP: 211 mmHg
Stage 6 Speed: 0 mph
Stage 7 DBP: 83 mmHg
Stage 7 Grade: 0 %
Stage 7 HR: 102 {beats}/min
Stage 7 SBP: 136 mmHg
Stage 7 Speed: 0 mph

## 2015-04-07 NOTE — Telephone Encounter (Signed)
Received serious monitor report from [facility] -- showing ST, HR 153. Patient reports no symptoms, states she thinks she was eating at the time. Reviewed with Dr. Mare Ferrari, no orders received. Informed patient we would continue to monitor and to call office if symptoms begin. Patient verbalized understanding and agreeable to plan.

## 2015-04-08 ENCOUNTER — Ambulatory Visit (INDEPENDENT_AMBULATORY_CARE_PROVIDER_SITE_OTHER): Payer: PPO | Admitting: Family Medicine

## 2015-04-08 ENCOUNTER — Encounter: Payer: Self-pay | Admitting: Family Medicine

## 2015-04-08 VITALS — BP 112/70 | HR 81 | Temp 98.0°F | Ht 67.0 in | Wt 214.5 lb

## 2015-04-08 DIAGNOSIS — M25562 Pain in left knee: Secondary | ICD-10-CM

## 2015-04-08 DIAGNOSIS — R079 Chest pain, unspecified: Secondary | ICD-10-CM

## 2015-04-08 DIAGNOSIS — R002 Palpitations: Secondary | ICD-10-CM | POA: Diagnosis not present

## 2015-04-08 NOTE — Patient Instructions (Signed)
We sent an order for the MRI of the knee.   Please avoid strenuous exercises for the knee including lunges, squats, stair stepping and inclined treadmill. Gentle activities such as walking and cycling would be okay.  Please do the home exercises at least 3-4 days per week. Tylenol or Aleve per instructions for pain as needed.  Follow-up with her cardiologist  as planned.  We will plan follow-up once we receive the results of the MRI.

## 2015-04-08 NOTE — Progress Notes (Signed)
HPI: Karen Hardin is a 49 yo F here for follow up of several issues:  1)L Knee pain: -x3 months - suspected PFS last visit and HEP provided, pain films neg -she did not do the exercises consistently, she started aggressive lunges/squats/etc and pain is worse with reported swelling at times -symptoms:pain around knee cap and post knee, intermittent swelling -she is worried something is going on inside the knee -no giving away, weakness, fevers, malaise  2)CP/paliptations/DOE: -saw cardiologist - stress test ok, event monitor pending -She reports she still has symptoms and has follow-up scheduled with her cardiologist -Denies: Wheezing, cough and fevers  3)tobacco abuse/Asthma: -she quit smoking 02/2015, no asthma symptoms  ROS: See pertinent positives and negatives per HPI.  Past Medical History  Diagnosis Date  . Asthma 2002  . 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 Migraines     migraines - otc med prn, then maxalt if needed  . Trichomonas   . Ovarian cyst   . Fibrocystic breast 2005  . LGSIL (low grade squamous intraepithelial lesion) on Pap smear 2007  . Vaginosis 2008  . 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 Depression     as a child, and stress recently  . Libido, decreased 2010  . Vitamin D deficiency   . Tachycardia     per patient diagnosed in ER 2014, no problems currently  . Anemia     Past Surgical History  Procedure Laterality Date  . Shoulder arthroscopy w/ rotator cuff repair  2006    right  . Oophorectomy  2000    laparotomy in [place]  . Tubal ligation  2000  . Dilation and curettage of uterus    . Breast surgery  1986    biopsy - benign  . Ganglion cyst removed from wrist      left  . Myomectomy      In [place] - laparotomy  . Laparoscopic assisted vaginal hysterectomy N/A 12/26/2012    Procedure: LAPAROSCOPIC ASSISTED VAGINAL HYSTERECTOMY Uterine Morcellation, Bilateral Salpingectomy, ;   Surgeon: Ena Dawley, MD;  Location: [facility];  Service: Gynecology;  Laterality: N/A;  . Cystoscopy  12/26/2012    Procedure: CYSTOSCOPY;  Surgeon: Ena Dawley, MD;  Location: [facility];  Service: Gynecology;;  . Tonsillectomy and adenoidectomy    . Wisdom tooth extraction      x 2 teeth    Family History  Problem Relation Age of Onset  . Arthritis Mother   . Diabetes Mother   . Cancer Father     lung cancer  . Heart disease Father   . Hyperlipidemia Maternal Grandmother   . Breast cancer Maternal Grandmother   . Colitis Paternal Aunt     in 24's  . Heart attack Father   . Heart attack Paternal Aunt   . Heart attack Paternal Uncle   . Hypertension Mother   . Stroke Paternal Aunt   . Pulmonary embolism Mother     Cardiac arrest     Social History   Social History  . Marital Status: Married    Spouse Name: N/A  . Number of Children: N/A  . Years of Education: N/A   Social History Main Topics  . Smoking status: Former Smoker -- 0.10 packs/day for 20 years    Start date: 02/28/2015  . Smokeless tobacco: Never Used     Comment: patient uses nicotine gum. Smoked for over  20 years. Quit in 02/2015.  Marland Kitchen Alcohol Use: No  . Drug Use: No  . Sexual Activity: Yes    Birth Control/ Protection: Surgical, Condom   Other Topics Concern  . None   Social History Narrative     Current outpatient prescriptions:  .  albuterol (PROVENTIL HFA;VENTOLIN HFA) 108 (90 BASE) MCG/ACT inhaler, Inhale 2 puffs into the lungs every 4 (four) hours as needed for wheezing. Dispense with aerochamber, Disp: 1 Inhaler, Rfl: 0 .  cyclobenzaprine (FLEXERIL) 10 MG tablet, Take 1 tablet (10 mg total) by mouth every 8 (eight) hours as needed for muscle spasms., Disp: 30 tablet, Rfl: 1 .  fluticasone (FLOVENT HFA) 44 MCG/ACT inhaler, Inhale 2 puffs into the lungs 2 (two) times daily., Disp: 1 Inhaler, Rfl: 12 .  ibuprofen (ADVIL,MOTRIN) 800 MG tablet, Take 1 tablet (800 mg total) by mouth every 8  (eight) hours as needed., Disp: 50 tablet, Rfl: 1 .  olopatadine (PATANOL) 0.1 % ophthalmic solution, Place 1 drop into both eyes 2 (two) times daily., Disp: , Rfl:  .  OVER THE COUNTER MEDICATION, Apply 1 application topically as needed. Theragesic Cream.  Applying to neck & back., Disp: , Rfl:  .  oxyCODONE-acetaminophen (PERCOCET) 10-325 MG tablet, Take 1 tablet by mouth every 4 (four) hours as needed for pain., Disp: , Rfl:  .  Rizatriptan Benzoate (MAXALT PO), Take by mouth., Disp: , Rfl:   EXAM:  Filed Vitals:   04/08/15 0938  BP: 112/70  Pulse: 81  Temp: 98 F (36.7 C)    Body mass index is 33.59 kg/(m^2).  GENERAL: vitals reviewed and listed above, alert, oriented, appears well hydrated and in no acute distress  HEENT: atraumatic, conjunttiva clear, no obvious abnormalities on inspection of external nose and ears  NECK: no obvious masses on inspection  LUNGS: clear to auscultation bilaterally, no wheezes, rales or rhonchi, good air movement  CV: HRRR, no peripheral edema  MS: moves all extremities without noticeable abnormality, mildly antalgic gait, questionable mild swelling of the left knee without redness or warmth, tenderness to palpation around the kneecap and the posterior knee with suspected small Baker's cyst, negative Lachman, negative drawer, negative McMurray, neurovascularly intact distally  PSYCH: pleasant and cooperative, no obvious depression or anxiety  ASSESSMENT AND PLAN:  Discussed the following assessment and plan:  Lateral knee pain, left - Plan: MR Knee Left  Wo Contrast -we discussed possible serious and likely etiologies, workup and treatment, treatment risks and return precautions -suspect patellofemoral syndrome and possible Baker's cyst, discussed MRI versus further evaluation medicine, particularly if symptoms persist despite initiation of the home exercises (provided at last appointment) or formal PT. Discussed modification of  activities. -after this discussion, Karen Hardin opted for MRI given her ongoing pain and swelling, order place -follow up advised pending MRI results -Continue Tylenol or Aleve as needed for pain -of course, we advised Karen Hardin  to return or notify a doctor immediately if symptoms worsen or persist or new concerns arise.  Chest pain, unspecified chest pain type Palpitations -Follow with cardiologist as planned -Advised if symptoms persist despite evaluation and no etiology found, that we should obtain pulmonary function testing to assess status of her asthma -Congratulated her smoking cessation!  -Patient advised to return or notify a doctor immediately if symptoms worsen or persist or new concerns arise.  Patient Instructions  We sent an order for the MRI of the knee.   Please avoid strenuous exercises for the knee including lunges, squats, stair  stepping and inclined treadmill. Gentle activities such as walking and cycling would be okay.  Please do the home exercises at least 3-4 days per week. Tylenol or Aleve per instructions for pain as needed.  Follow-up with her cardiologist  as planned.  We will plan follow-up once we receive the results of the MRI.      Colin Benton R.

## 2015-04-08 NOTE — Progress Notes (Signed)
Pre visit review using our clinic review tool, if applicable. No additional management support is needed unless otherwise documented below in the visit note. 

## 2015-04-18 ENCOUNTER — Telehealth: Payer: Self-pay

## 2015-04-18 NOTE — Telephone Encounter (Signed)
Received fax from [facility] [place] [place] that the pt was in Sinus Tachycardia on Saturday 04/16/15 @ 7:32pm. The pt was contacted at that time by a [employer] Tech who noted that the pt was SOB, outside, running to get inside. Report reviewed by Dr Velva Harman Sinus tachycardia.  No new orders.

## 2015-04-19 ENCOUNTER — Ambulatory Visit
Admission: RE | Admit: 2015-04-19 | Discharge: 2015-04-19 | Disposition: A | Discharge status: Home / Self Care | Payer: PPO | Source: Ambulatory Visit | Attending: Family Medicine | Admitting: Family Medicine

## 2015-04-19 DIAGNOSIS — M25462 Effusion, left knee: Secondary | ICD-10-CM | POA: Diagnosis not present

## 2015-04-19 DIAGNOSIS — M25562 Pain in left knee: Secondary | ICD-10-CM

## 2015-04-22 ENCOUNTER — Encounter: Payer: Self-pay | Admitting: Cardiology

## 2015-04-22 ENCOUNTER — Other Ambulatory Visit: Payer: Self-pay | Admitting: *Deleted

## 2015-04-22 DIAGNOSIS — M25562 Pain in left knee: Secondary | ICD-10-CM

## 2015-04-25 DIAGNOSIS — M25562 Pain in left knee: Secondary | ICD-10-CM | POA: Diagnosis not present

## 2015-05-02 ENCOUNTER — Ambulatory Visit: Payer: PPO | Admitting: Cardiology

## 2015-05-04 ENCOUNTER — Ambulatory Visit (INDEPENDENT_AMBULATORY_CARE_PROVIDER_SITE_OTHER): Payer: PPO | Admitting: Cardiology

## 2015-05-04 ENCOUNTER — Encounter: Payer: Self-pay | Admitting: Cardiology

## 2015-05-04 VITALS — BP 132/82 | HR 88 | Ht 67.0 in | Wt 215.0 lb

## 2015-05-04 DIAGNOSIS — R079 Chest pain, unspecified: Secondary | ICD-10-CM | POA: Diagnosis not present

## 2015-05-04 DIAGNOSIS — R002 Palpitations: Secondary | ICD-10-CM | POA: Diagnosis not present

## 2015-05-04 NOTE — Progress Notes (Signed)
Cardiology Office Note    Date:  05/04/2015   ID:  Karen Hardin, DOB 03/27/1965, MRN [id number]  PCP:  Lucretia Kern., DO  Cardiologist:   Candee Furbish, MD     History of Present Illness:  Karen Hardin is a 50 y.o. female with blindness who was previously seen by Melina Copa back on 03/17/15 with atypical chest pain. Localize the left axillary area no anterior chest pain over the last month 3-4 times per week without any particular trigger lasting seconds at a time one episode did last about 15 minutes then resolve spontaneously. Not worse with inspiration. Left arm numbness when it occurs. Long training issues with neck and she wondered if this was all related.  She also had shortness of breath over the last few months. Palpitations were present for several years. Over 50 times per year. She quit smoking after 20 years of tobacco use. Her father had 2 myocardial infarctions before age 89 and died of lung cancer. Her mother had cardiac arrest in her 13s in the setting of PE.  Her stress test was normal on 04/06/15. Excellent.  Left knee pain. Cortisone shot. No significant chest pain currently. Palpitation for years as above.  Past Medical History  Diagnosis Date  . Asthma 2002  . 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 Migraines     migraines - otc med prn, then maxalt if needed  . Trichomonas   . Ovarian cyst   . Fibrocystic breast 2005  . LGSIL (low grade squamous intraepithelial lesion) on Pap smear 2007  . Vaginosis 2008  . 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 Depression     as a child, and stress recently  . Libido, decreased 2010  . Vitamin D deficiency   . Tachycardia     per patient diagnosed in ER 2014, no problems currently  . Anemia     Past Surgical History  Procedure Laterality Date  . Shoulder arthroscopy w/ rotator cuff repair  2006    right  . Oophorectomy  2000    laparotomy in [place]  . Tubal  ligation  2000  . Dilation and curettage of uterus    . Breast surgery  1986    biopsy - benign  . Ganglion cyst removed from wrist      left  . Myomectomy      In [place] - laparotomy  . Laparoscopic assisted vaginal hysterectomy N/A 12/26/2012    Procedure: LAPAROSCOPIC ASSISTED VAGINAL HYSTERECTOMY Uterine Morcellation, Bilateral Salpingectomy, ;  Surgeon: Ena Dawley, MD;  Location: [facility];  Service: Gynecology;  Laterality: N/A;  . Cystoscopy  12/26/2012    Procedure: CYSTOSCOPY;  Surgeon: Ena Dawley, MD;  Location: [facility];  Service: Gynecology;;  . Tonsillectomy and adenoidectomy    . Wisdom tooth extraction      x 2 teeth    Outpatient Prescriptions Prior to Visit  Medication Sig Dispense Refill  . albuterol (PROVENTIL HFA;VENTOLIN HFA) 108 (90 BASE) MCG/ACT inhaler Inhale 2 puffs into the lungs every 4 (four) hours as needed for wheezing. Dispense with aerochamber 1 Inhaler 0  . cyclobenzaprine (FLEXERIL) 10 MG tablet Take 1 tablet (10 mg total) by mouth every 8 (eight) hours as needed for muscle spasms. 30 tablet 1  . fluticasone (FLOVENT HFA) 44 MCG/ACT inhaler Inhale 2 puffs into the lungs 2 (two) times daily. 1 Inhaler 12  .  ibuprofen (ADVIL,MOTRIN) 800 MG tablet Take 1 tablet (800 mg total) by mouth every 8 (eight) hours as needed. 50 tablet 1  . olopatadine (PATANOL) 0.1 % ophthalmic solution Place 1 drop into both eyes 2 (two) times daily.    Marland Kitchen OVER THE COUNTER MEDICATION Apply 1 application topically as needed. Theragesic Cream.  Applying to neck & back.    Marland Kitchen oxyCODONE-acetaminophen (PERCOCET) 10-325 MG tablet Take 1 tablet by mouth every 4 (four) hours as needed for pain.    . Rizatriptan Benzoate (MAXALT PO) Take by mouth.     No facility-administered medications prior to visit.     Allergies:   Review of patient's allergies indicates no known allergies.   Social History   Social History  . Marital Status: Married    Spouse Name: N/A  . Number of  Children: N/A  . Years of Education: N/A   Social History Main Topics  . Smoking status: Former Smoker -- 0.10 packs/day for 20 years    Start date: 02/28/2015  . Smokeless tobacco: Never Used     Comment: patient uses nicotine gum. Smoked for over 20 years. Quit in 02/2015.  Marland Kitchen Alcohol Use: No  . Drug Use: No  . Sexual Activity: Yes    Birth Control/ Protection: Surgical, Condom   Other Topics Concern  . None   Social History Narrative     Family History:  The patient's family history includes Arthritis in her mother; Breast cancer in her maternal grandmother; Cancer in her father; Colitis in her paternal aunt; Diabetes in her mother; Heart attack in her father, paternal aunt, and paternal uncle; Heart disease in her father; Hyperlipidemia in her maternal grandmother; Hypertension in her mother; Pulmonary embolism in her mother; Stroke in her paternal aunt.   ROS:   Please see the history of present illness.    ROS All other systems reviewed and are negative.   PHYSICAL EXAM:   VS:  BP 132/82 mmHg  Pulse 88  Ht 5\' 7"  (1.702 m)  Wt 215 lb (97.523 kg)  BMI 33.67 kg/m2  LMP 12/14/2012   GEN: Well nourished, well developed, in no acute distress HEENT: normal Neck: no JVD, carotid bruits, or masses Cardiac: RRR; no murmurs, rubs, or gallops,no edema  Respiratory:  clear to auscultation bilaterally, normal work of breathing GI: soft, nontender, nondistended, + BS MS: no deformity or atrophy Skin: warm and dry, no rash Neuro:  Alert and Oriented x 3, Strength and sensation are intact Psych: euthymic mood, full affect  Wt Readings from Last 3 Encounters:  05/04/15 215 lb (97.523 kg)  04/08/15 214 lb 8 oz (97.297 kg)  03/17/15 213 lb (96.616 kg)      Studies/Labs Reviewed:   EKG:  EKG is not ordered today.    Recent Labs: 03/17/2015: BUN 6*; Creat 0.67; Hemoglobin 12.7; Magnesium 1.8; Platelets 365; Potassium 4.4; Sodium 141; TSH 0.514   Lipid Panel    Component Value  Date/Time   CHOL 158 08/13/2014 0933   TRIG 77.0 08/13/2014 0933   HDL 46.40 08/13/2014 0933   CHOLHDL 3 08/13/2014 0933   VLDL 15.4 08/13/2014 0933   LDLCALC 96 08/13/2014 0933    Additional studies/ records that were reviewed today include:  Exercise treadmill test 04/06/15-low risk. Blood pressure did elevate during exercise but not excessively high. No dangerous arrhythmias.  Event monitor-pending results.    ASSESSMENT:    1. Palpitations   2. Chest pain, unspecified      PLAN:  In order of problems listed above:  1. Awaiting results of event monitor. We discussed the possibility of addition of metoprolol, beta blocker however I do not feel strongly that she requires this. Her blood pressure baseline has been reassuring. She did have an increase during exercise however this was not significantly pathologic. Continue to work on weight loss. Her left knee may be prohibitive at times. She states that she will need a knee replacement at some point. 2. Reassuring exercise trouble test with no signs of ischemia. Likely more musculoskeletal discomfort.  We'll give her results of monitor when it is back.  Medication Adjustments/Labs and Tests Ordered: Current medicines are reviewed at length with the patient today.  Concerns regarding medicines are outlined above.  Medication changes, Labs and Tests ordered today are listed in the Patient Instructions below. There are no Patient Instructions on file for this visit.     Bobby Rumpf, MD  05/04/2015 8:48 AM    [facility] [place], [place], [place]  [address] Phone: [phone]; Fax: [phone]

## 2015-05-04 NOTE — Patient Instructions (Signed)
Your physician recommends that you continue on your current medications as directed. Please refer to the Current Medication list given to you today. Your physician recommends that you schedule a follow-up appointment as needed with Dr. Marlou Porch.

## 2015-06-17 DIAGNOSIS — M2352 Chronic instability of knee, left knee: Secondary | ICD-10-CM | POA: Diagnosis not present

## 2015-06-17 DIAGNOSIS — M1711 Unilateral primary osteoarthritis, right knee: Secondary | ICD-10-CM | POA: Diagnosis not present

## 2015-06-17 DIAGNOSIS — M2351 Chronic instability of knee, right knee: Secondary | ICD-10-CM | POA: Diagnosis not present

## 2015-06-17 DIAGNOSIS — M1712 Unilateral primary osteoarthritis, left knee: Secondary | ICD-10-CM | POA: Diagnosis not present

## 2015-08-02 DIAGNOSIS — H5213 Myopia, bilateral: Secondary | ICD-10-CM | POA: Diagnosis not present

## 2015-08-02 DIAGNOSIS — H524 Presbyopia: Secondary | ICD-10-CM | POA: Diagnosis not present

## 2015-08-02 DIAGNOSIS — H3553 Other dystrophies primarily involving the sensory retina: Secondary | ICD-10-CM | POA: Diagnosis not present

## 2015-08-02 LAB — HM DIABETES EYE EXAM

## 2015-08-23 ENCOUNTER — Other Ambulatory Visit: Payer: Self-pay | Admitting: Obstetrics and Gynecology

## 2015-08-23 DIAGNOSIS — R5381 Other malaise: Secondary | ICD-10-CM

## 2015-08-24 ENCOUNTER — Other Ambulatory Visit: Payer: Self-pay | Admitting: Obstetrics and Gynecology

## 2015-08-24 DIAGNOSIS — N6320 Unspecified lump in the left breast, unspecified quadrant: Secondary | ICD-10-CM

## 2015-08-24 DIAGNOSIS — N644 Mastodynia: Secondary | ICD-10-CM | POA: Diagnosis not present

## 2015-08-24 DIAGNOSIS — N6002 Solitary cyst of left breast: Secondary | ICD-10-CM | POA: Diagnosis not present

## 2015-08-24 DIAGNOSIS — Z01411 Encounter for gynecological examination (general) (routine) with abnormal findings: Secondary | ICD-10-CM | POA: Diagnosis not present

## 2015-08-24 DIAGNOSIS — Z6832 Body mass index (BMI) 32.0-32.9, adult: Secondary | ICD-10-CM | POA: Diagnosis not present

## 2015-08-31 ENCOUNTER — Ambulatory Visit
Admission: RE | Admit: 2015-08-31 | Discharge: 2015-08-31 | Disposition: A | Discharge status: Home / Self Care | Payer: PPO | Source: Ambulatory Visit | Attending: Obstetrics and Gynecology | Admitting: Obstetrics and Gynecology

## 2015-08-31 ENCOUNTER — Other Ambulatory Visit: Payer: Self-pay | Admitting: Obstetrics and Gynecology

## 2015-08-31 DIAGNOSIS — N6320 Unspecified lump in the left breast, unspecified quadrant: Secondary | ICD-10-CM

## 2015-08-31 DIAGNOSIS — N6002 Solitary cyst of left breast: Secondary | ICD-10-CM | POA: Diagnosis not present

## 2015-08-31 DIAGNOSIS — N63 Unspecified lump in breast: Secondary | ICD-10-CM | POA: Diagnosis not present

## 2015-09-02 ENCOUNTER — Other Ambulatory Visit: Payer: PPO

## 2015-09-13 ENCOUNTER — Other Ambulatory Visit: Payer: Self-pay | Admitting: Family Medicine

## 2015-09-22 ENCOUNTER — Ambulatory Visit (INDEPENDENT_AMBULATORY_CARE_PROVIDER_SITE_OTHER): Payer: PPO | Admitting: Adult Health

## 2015-09-22 ENCOUNTER — Encounter: Payer: Self-pay | Admitting: Adult Health

## 2015-09-22 VITALS — BP 122/78 | Temp 97.8°F | Ht 67.0 in | Wt 225.0 lb

## 2015-09-22 DIAGNOSIS — G8929 Other chronic pain: Secondary | ICD-10-CM

## 2015-09-22 DIAGNOSIS — M549 Dorsalgia, unspecified: Secondary | ICD-10-CM

## 2015-09-22 DIAGNOSIS — IMO0001 Reserved for inherently not codable concepts without codable children: Secondary | ICD-10-CM

## 2015-09-22 DIAGNOSIS — Z7689 Persons encountering health services in other specified circumstances: Secondary | ICD-10-CM

## 2015-09-22 DIAGNOSIS — Z7189 Other specified counseling: Secondary | ICD-10-CM

## 2015-09-22 DIAGNOSIS — E1165 Type 2 diabetes mellitus with hyperglycemia: Secondary | ICD-10-CM | POA: Insufficient documentation

## 2015-09-22 DIAGNOSIS — IMO0002 Reserved for concepts with insufficient information to code with codable children: Secondary | ICD-10-CM | POA: Insufficient documentation

## 2015-09-22 LAB — POCT GLYCOSYLATED HEMOGLOBIN (HGB A1C): Hemoglobin A1C: 7.1

## 2015-09-22 MED ORDER — ACCU-CHEK MULTICLIX LANCETS MISC: 12 refills | Status: DC

## 2015-09-22 MED ORDER — OXYCODONE-ACETAMINOPHEN 10-325 MG PO TABS
1.0000 | ORAL_TABLET | Freq: Three times a day (TID) | ORAL | 0 refills | Status: DC | PRN
Start: 2015-09-22 — End: 2017-03-13

## 2015-09-22 MED ORDER — GLUCOSE BLOOD VI STRP: ORAL_STRIP | 12 refills | Status: DC

## 2015-09-22 NOTE — Patient Instructions (Addendum)
It was great meeting you today   Your A1c is 7.1. You are considered a diabetic   Please start eating healthy and exercising.   I have given you a prescription for Percocet. Please follow up with pain management for any further refills.   Follow up this fall for your complete physical exam    Health Maintenance, Female Adopting a healthy lifestyle and getting preventive care can go a long way to promote health and wellness. Talk with your health care provider about what schedule of regular examinations is right for you. This is a good chance for you to check in with your provider about disease prevention and staying healthy. In between checkups, there are plenty of things you can do on your own. Experts have done a lot of research about which lifestyle changes and preventive measures are most likely to keep you healthy. Ask your health care provider for more information. WEIGHT AND DIET  Eat a healthy diet  Be sure to include plenty of vegetables, fruits, low-fat dairy products, and lean protein.  Do not eat a lot of foods high in solid fats, added sugars, or salt.  Get regular exercise. This is one of the most important things you can do for your health.  Most adults should exercise for at least 150 minutes each week. The exercise should increase your heart rate and make you sweat (moderate-intensity exercise).  Most adults should also do strengthening exercises at least twice a week. This is in addition to the moderate-intensity exercise.  Maintain a healthy weight  Body mass index (BMI) is a measurement that can be used to identify possible weight problems. It estimates body fat based on height and weight. Your health care provider can help determine your BMI and help you achieve or maintain a healthy weight.  For females 87 years of age and older:   A BMI below 18.5 is considered underweight.  A BMI of 18.5 to 24.9 is normal.  A BMI of 25 to 29.9 is considered  overweight.  A BMI of 30 and above is considered obese.  Watch levels of cholesterol and blood lipids  You should start having your blood tested for lipids and cholesterol at 51 years of age, then have this test every 5 years.  You may need to have your cholesterol levels checked more often if:  Your lipid or cholesterol levels are high.  You are older than 51 years of age.  You are at high risk for heart disease.  CANCER SCREENING   Lung Cancer  Lung cancer screening is recommended for adults 72-67 years old who are at high risk for lung cancer because of a history of smoking.  A yearly low-dose CT scan of the lungs is recommended for people who:  Currently smoke.  Have quit within the past 15 years.  Have at least a 30-pack-year history of smoking. A pack year is smoking an average of one pack of cigarettes a day for 1 year.  Yearly screening should continue until it has been 15 years since you quit.  Yearly screening should stop if you develop a health problem that would prevent you from having lung cancer treatment.  Breast Cancer  Practice breast self-awareness. This means understanding how your breasts normally appear and feel.  It also means doing regular breast self-exams. Let your health care provider know about any changes, no matter how small.  If you are in your 20s or 30s, you should have a clinical breast exam (  CBE) by a health care provider every 1-3 years as part of a regular health exam.  If you are 69 or older, have a CBE every year. Also consider having a breast X-ray (mammogram) every year.  If you have a family history of breast cancer, talk to your health care provider about genetic screening.  If you are at high risk for breast cancer, talk to your health care provider about having an MRI and a mammogram every year.  Breast cancer gene (BRCA) assessment is recommended for women who have family members with BRCA-related cancers. BRCA-related  cancers include:  Breast.  Ovarian.  Tubal.  Peritoneal cancers.  Results of the assessment will determine the need for genetic counseling and BRCA1 and BRCA2 testing. Cervical Cancer Your health care provider may recommend that you be screened regularly for cancer of the pelvic organs (ovaries, uterus, and vagina). This screening involves a pelvic examination, including checking for microscopic changes to the surface of your cervix (Pap test). You may be encouraged to have this screening done every 3 years, beginning at age 32.  For women ages 38-65, health care providers may recommend pelvic exams and Pap testing every 3 years, or they may recommend the Pap and pelvic exam, combined with testing for human papilloma virus (HPV), every 5 years. Some types of HPV increase your risk of cervical cancer. Testing for HPV may also be done on women of any age with unclear Pap test results.  Other health care providers may not recommend any screening for nonpregnant women who are considered low risk for pelvic cancer and who do not have symptoms. Ask your health care provider if a screening pelvic exam is right for you.  If you have had past treatment for cervical cancer or a condition that could lead to cancer, you need Pap tests and screening for cancer for at least 20 years after your treatment. If Pap tests have been discontinued, your risk factors (such as having a new sexual partner) need to be reassessed to determine if screening should resume. Some women have medical problems that increase the chance of getting cervical cancer. In these cases, your health care provider may recommend more frequent screening and Pap tests. Colorectal Cancer  This type of cancer can be detected and often prevented.  Routine colorectal cancer screening usually begins at 51 years of age and continues through 51 years of age.  Your health care provider may recommend screening at an earlier age if you have risk  factors for colon cancer.  Your health care provider may also recommend using home test kits to check for hidden blood in the stool.  A small camera at the end of a tube can be used to examine your colon directly (sigmoidoscopy or colonoscopy). This is done to check for the earliest forms of colorectal cancer.  Routine screening usually begins at age 83.  Direct examination of the colon should be repeated every 5-10 years through 51 years of age. However, you may need to be screened more often if early forms of precancerous polyps or small growths are found. Skin Cancer  Check your skin from head to toe regularly.  Tell your health care provider about any new moles or changes in moles, especially if there is a change in a mole's shape or color.  Also tell your health care provider if you have a mole that is larger than the size of a pencil eraser.  Always use sunscreen. Apply sunscreen liberally and repeatedly throughout  the day.  Protect yourself by wearing long sleeves, pants, a wide-brimmed hat, and sunglasses whenever you are outside. HEART DISEASE, DIABETES, AND HIGH BLOOD PRESSURE   High blood pressure causes heart disease and increases the risk of stroke. High blood pressure is more likely to develop in:  People who have blood pressure in the high end of the normal range (130-139/85-89 mm Hg).  People who are overweight or obese.  People who are African American.  If you are 104-76 years of age, have your blood pressure checked every 3-5 years. If you are 61 years of age or older, have your blood pressure checked every year. You should have your blood pressure measured twice--once when you are at a hospital or clinic, and once when you are not at a hospital or clinic. Record the average of the two measurements. To check your blood pressure when you are not at a hospital or clinic, you can use:  An automated blood pressure machine at a pharmacy.  A home blood pressure  monitor.  If you are between 39 years and 63 years old, ask your health care provider if you should take aspirin to prevent strokes.  Have regular diabetes screenings. This involves taking a blood sample to check your fasting blood sugar level.  If you are at a normal weight and have a low risk for diabetes, have this test once every three years after 51 years of age.  If you are overweight and have a high risk for diabetes, consider being tested at a younger age or more often. PREVENTING INFECTION  Hepatitis B  If you have a higher risk for hepatitis B, you should be screened for this virus. You are considered at high risk for hepatitis B if:  You were born in a country where hepatitis B is common. Ask your health care provider which countries are considered high risk.  Your parents were born in a high-risk country, and you have not been immunized against hepatitis B (hepatitis B vaccine).  You have HIV or AIDS.  You use needles to inject street drugs.  You live with someone who has hepatitis B.  You have had sex with someone who has hepatitis B.  You get hemodialysis treatment.  You take certain medicines for conditions, including cancer, organ transplantation, and autoimmune conditions. Hepatitis C  Blood testing is recommended for:  Everyone born from [birth year] through 1966.  Anyone with known risk factors for hepatitis C. Sexually transmitted infections (STIs)  You should be screened for sexually transmitted infections (STIs) including gonorrhea and chlamydia if:  You are sexually active and are younger than 51 years of age.  You are older than 51 years of age and your health care provider tells you that you are at risk for this type of infection.  Your sexual activity has changed since you were last screened and you are at an increased risk for chlamydia or gonorrhea. Ask your health care provider if you are at risk.  If you do not have HIV, but are at risk, it may be  recommended that you take a prescription medicine daily to prevent HIV infection. This is called pre-exposure prophylaxis (PrEP). You are considered at risk if:  You are sexually active and do not regularly use condoms or know the HIV status of your partner(s).  You take drugs by injection.  You are sexually active with a partner who has HIV. Talk with your health care provider about whether you are at high risk  of being infected with HIV. If you choose to begin PrEP, you should first be tested for HIV. You should then be tested every 3 months for as long as you are taking PrEP.  PREGNANCY   If you are premenopausal and you may become pregnant, ask your health care provider about preconception counseling.  If you may become pregnant, take 400 to 800 micrograms (mcg) of folic acid every day.  If you want to prevent pregnancy, talk to your health care provider about birth control (contraception). OSTEOPOROSIS AND MENOPAUSE   Osteoporosis is a disease in which the bones lose minerals and strength with aging. This can result in serious bone fractures. Your risk for osteoporosis can be identified using a bone density scan.  If you are 51 years of age or older, or if you are at risk for osteoporosis and fractures, ask your health care provider if you should be screened.  Ask your health care provider whether you should take a calcium or vitamin D supplement to lower your risk for osteoporosis.  Menopause may have certain physical symptoms and risks.  Hormone replacement therapy may reduce some of these symptoms and risks. Talk to your health care provider about whether hormone replacement therapy is right for you.  HOME CARE INSTRUCTIONS   Schedule regular health, dental, and eye exams.  Stay current with your immunizations.   Do not use any tobacco products including cigarettes, chewing tobacco, or electronic cigarettes.  If you are pregnant, do not drink alcohol.  If you are  breastfeeding, limit how much and how often you drink alcohol.  Limit alcohol intake to no more than 1 drink per day for nonpregnant women. One drink equals 12 ounces of beer, 5 ounces of wine, or 1 ounces of hard liquor.  Do not use street drugs.  Do not share needles.  Ask your health care provider for help if you need support or information about quitting drugs.  Tell your health care provider if you often feel depressed.  Tell your health care provider if you have ever been abused or do not feel safe at home.   This information is not intended to replace advice given to you by your health care provider. Make sure you discuss any questions you have with your health care provider.   Document Released: 08/14/2010 Document Revised: 02/19/2014 Document Reviewed: 12/31/2012 Elsevier Interactive Patient Education [employer].

## 2015-09-22 NOTE — Progress Notes (Signed)
Patient presents to clinic today to establish care. She is a pleasant 50 year old female who  has a past medical history of Anemia; Arthritis; Asthma (2002); Depression; Fibrocystic breast (2005); LGSIL (low grade squamous intraepithelial lesion) on Pap smear (2007); Libido, decreased (2010); Migraines; Ovarian cyst; Stargardt's disease (1986); Tachycardia; Trichomonas; Vaginosis (2008); and Vitamin D deficiency.   She is unsure of her last physical   She previously seen by Dr. Maudie Mercury.    Acute Concerns: Establish Care   Establish Blood Sugars  - She has been checking her blood sugars at home and reports blood sugars in the 180's.   Chronic Issues: Chronic back pain  - She is trying to reestablish with pain management for chronic back pain. She reports that she is waiting to hear back from them.  Depression - Feels as though this is well contolled.   Obesity  - She does not exercise and does not eat healthy. She understands that she needs to do this.    Health Maintenance: Dental -- Does not see  Vision -- Saw in July.  Immunizations --Needs Tdap  Colonoscopy -- 2016 - Had polyps - every 5 years  Mammogram -- July 2017   Diet: She does not eat healthy  Exercise: She does not exercise.   Is followed by  GYN - Dr. Raphael Gibney , had her routine checkup in July - Orthopedics - Dr. Percell Miller - Pain management specialist.     Past Medical History:  Diagnosis Date  . Anemia   . 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 Asthma 2002  . Depression    as a child, and stress recently  . Fibrocystic breast 2005  . LGSIL (low grade squamous intraepithelial lesion) on Pap smear 2007  . Libido, decreased 2010  . Migraines    migraines - otc med prn, then maxalt if needed  . Ovarian cyst   . 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 Tachycardia    per patient diagnosed in ER 2014, no problems currently  . Trichomonas   . Vaginosis 2008  . Vitamin D  deficiency     Past Surgical History:  Procedure Laterality Date  . BREAST SURGERY  1986   biopsy - benign  . CYSTOSCOPY  12/26/2012   Procedure: CYSTOSCOPY;  Surgeon: Ena Dawley, MD;  Location: [facility];  Service: Gynecology;;  . DILATION AND CURETTAGE OF UTERUS    . ganglion cyst removed from wrist     left  . LAPAROSCOPIC ASSISTED VAGINAL HYSTERECTOMY N/A 12/26/2012   Procedure: LAPAROSCOPIC ASSISTED VAGINAL HYSTERECTOMY Uterine Morcellation, Bilateral Salpingectomy, ;  Surgeon: Ena Dawley, MD;  Location: [facility];  Service: Gynecology;  Laterality: N/A;  . MYOMECTOMY     In [place] - laparotomy  . OOPHORECTOMY  2000   laparotomy in [place]  . SHOULDER ARTHROSCOPY W/ ROTATOR CUFF REPAIR  2006   right  . TONSILLECTOMY AND ADENOIDECTOMY    . TUBAL LIGATION  2000  . WISDOM TOOTH EXTRACTION     x 2 teeth    Current Outpatient Prescriptions on File Prior to Visit  Medication Sig Dispense Refill  . albuterol (PROVENTIL HFA;VENTOLIN HFA) 108 (90 BASE) MCG/ACT inhaler Inhale 2 puffs into the lungs every 4 (four) hours as needed for wheezing. Dispense with aerochamber 1 Inhaler 0  . cyclobenzaprine (FLEXERIL) 10 MG tablet TAKE ONE TABLET BY MOUTH EVERY 8 HOURS AS NEEDED FOR MUSCLE  SPASMS 20 tablet 0  . fluticasone (FLOVENT HFA) 44 MCG/ACT inhaler Inhale 2 puffs into the lungs 2 (two) times daily. 1 Inhaler 12  . ibuprofen (ADVIL,MOTRIN) 800 MG tablet Take 1 tablet (800 mg total) by mouth every 8 (eight) hours as needed. 50 tablet 1  . olopatadine (PATANOL) 0.1 % ophthalmic solution Place 1 drop into both eyes 2 (two) times daily.    Marland Kitchen OVER THE COUNTER MEDICATION Apply 1 application topically as needed. Theragesic Cream.  Applying to neck & back.    Marland Kitchen oxyCODONE-acetaminophen (PERCOCET) 10-325 MG tablet Take 1 tablet by mouth every 4 (four) hours as needed for pain.    . Rizatriptan Benzoate (MAXALT PO) Take by mouth.     No current facility-administered medications on file  prior to visit.     No Known Allergies  Family History  Problem Relation Age of Onset  . Arthritis Mother   . Diabetes Mother   . Cancer Father     lung cancer  . Heart disease Father   . Hyperlipidemia Maternal Grandmother   . Breast cancer Maternal Grandmother   . Colitis Paternal Aunt     in 50's  . Heart attack Father   . Heart attack Paternal Aunt   . Heart attack Paternal Uncle   . Hypertension Mother   . Stroke Paternal Aunt   . Pulmonary embolism Mother     Cardiac arrest     Social History   Social History  . Marital status: Married    Spouse name: N/A  . Number of children: N/A  . Years of education: N/A   Occupational History  . Not on file.   Social History Main Topics  . Smoking status: Former Smoker    Packs/day: 0.10    Years: 20.00    Start date: 02/28/2015  . Smokeless tobacco: Never Used     Comment: patient uses nicotine gum. Smoked for over 20 years. Quit in 02/2015.  Marland Kitchen Alcohol use No  . Drug use: No  . Sexual activity: Yes    Birth control/ protection: Surgical, Condom   Other Topics Concern  . Not on file   Social History Narrative  . No narrative on file    Review of Systems  Constitutional: Negative.   HENT: Negative.   Respiratory: Negative.   Cardiovascular: Negative.   Gastrointestinal: Negative.   Genitourinary: Negative.   Musculoskeletal: Positive for back pain and neck pain.  Skin: Negative.   Neurological: Negative.   Endo/Heme/Allergies: Negative.   Psychiatric/Behavioral: Negative.   All other systems reviewed and are negative.   LMP 12/14/2012   Physical Exam  Constitutional: She is oriented to person, place, and time and well-developed, well-nourished, and in no distress. No distress.  obese  HENT:  Head: Normocephalic and atraumatic.  Right Ear: External ear normal.  Left Ear: External ear normal.  Nose: Nose normal.  Mouth/Throat: Oropharynx is clear and moist.  Eyes: Conjunctivae and EOM are normal.  Pupils are equal, round, and reactive to light. Right eye exhibits no discharge. Left eye exhibits no discharge. No scleral icterus.  Neck: Normal range of motion. Neck supple. No JVD present. No tracheal deviation present. No thyromegaly present.  Cardiovascular: Normal rate, regular rhythm, normal heart sounds and intact distal pulses.  Exam reveals no gallop and no friction rub.   No murmur heard. Pulmonary/Chest: Effort normal and breath sounds normal. No stridor. No respiratory distress. She has no wheezes. She has no rales. She exhibits no  tenderness.  Abdominal: Soft. Bowel sounds are normal. She exhibits no distension and no mass. There is no tenderness. There is no rebound and no guarding.  Musculoskeletal: Normal range of motion. She exhibits tenderness (cervicl and lumbar spine ). She exhibits no edema or deformity.  Lymphadenopathy:    She has no cervical adenopathy.  Neurological: She is alert and oriented to person, place, and time. Gait normal. GCS score is 15.  Skin: Skin is warm and dry. No rash noted. She is not diaphoretic. No erythema. No pallor.  Psychiatric: Mood, memory, affect and judgment normal.  Nursing note and vitals reviewed.   Assessment/Plan: 1. Encounter to establish care   2. Chronic back pain - I will give her a one time prescription for percocet. She understands this is a one time order and it is to get her through until she sees pain management  - oxyCODONE-acetaminophen (PERCOCET) 10-325 MG tablet; Take 1 tablet by mouth every 8 (eight) hours as needed for pain.  Dispense: 60 tablet; Refill: 0  3. Uncontrolled type 2 diabetes mellitus without complication, without long-term current use of insulin (HCC)  - POC HgB A1c - 7.1 - she is a newly diagnosed diabetic.  - Amb Referral to Nutrition and Diabetic E - glucose blood test strip; Use as instructed  Dispense: 100 each; Refill: 12 - Lancets (ACCU-CHEK MULTICLIX) lancets; Use as instructed  Dispense:  100 each; Refill: 12 - I advised diet and exercise to help control her blood sugars.  - Follow up in 6 weeks  - Check blood sugars daily in the morning  - Consider adding metformin if she does not start dieting and exercising.  - Goal weight is 215 by next visit   [name], NP

## 2015-10-03 ENCOUNTER — Telehealth: Payer: Self-pay | Admitting: *Deleted

## 2015-10-03 MED ORDER — GLUCOSE BLOOD VI STRP: ORAL_STRIP | 1 refills | Status: DC

## 2015-10-03 MED ORDER — LANCETS MISC: 1 refills | Status: DC

## 2015-10-03 MED ORDER — PRECISION XTRA MONITOR DEVI: 0 refills | Status: DC

## 2015-10-03 NOTE — Telephone Encounter (Signed)
Rx for new glucometer and supplies was sent to the pts pharmacy.

## 2015-10-03 NOTE — Telephone Encounter (Signed)
Patient came in office and stated that he insurance won't pay for the Glucose meter that [employer]. Prescribed her. She needs a RX for Precision Extra glucose meter. She also needs the Glucose strip and lancets to go with the meter.  Patient states if you have any question you can call her.Her call back number is [phone].  Her pharmacy is [facility]. Please advise provider

## 2015-10-07 ENCOUNTER — Encounter (HOSPITAL_COMMUNITY): Payer: Self-pay

## 2015-10-07 ENCOUNTER — Emergency Department (HOSPITAL_COMMUNITY): Payer: PPO

## 2015-10-07 ENCOUNTER — Emergency Department (HOSPITAL_COMMUNITY)
Admission: EM | Admit: 2015-10-07 | Discharge: 2015-10-07 | Disposition: A | Discharge status: Home / Self Care | Payer: PPO | Attending: Emergency Medicine | Admitting: Emergency Medicine

## 2015-10-07 DIAGNOSIS — J45909 Unspecified asthma, uncomplicated: Secondary | ICD-10-CM | POA: Insufficient documentation

## 2015-10-07 DIAGNOSIS — Y92410 Unspecified street and highway as the place of occurrence of the external cause: Secondary | ICD-10-CM | POA: Insufficient documentation

## 2015-10-07 DIAGNOSIS — M545 Low back pain: Secondary | ICD-10-CM | POA: Diagnosis not present

## 2015-10-07 DIAGNOSIS — Z87891 Personal history of nicotine dependence: Secondary | ICD-10-CM | POA: Diagnosis not present

## 2015-10-07 DIAGNOSIS — R079 Chest pain, unspecified: Secondary | ICD-10-CM | POA: Diagnosis not present

## 2015-10-07 DIAGNOSIS — E119 Type 2 diabetes mellitus without complications: Secondary | ICD-10-CM | POA: Diagnosis not present

## 2015-10-07 DIAGNOSIS — R0781 Pleurodynia: Secondary | ICD-10-CM | POA: Diagnosis not present

## 2015-10-07 DIAGNOSIS — S161XXA Strain of muscle, fascia and tendon at neck level, initial encounter: Secondary | ICD-10-CM | POA: Diagnosis not present

## 2015-10-07 DIAGNOSIS — S3992XA Unspecified injury of lower back, initial encounter: Secondary | ICD-10-CM | POA: Diagnosis not present

## 2015-10-07 DIAGNOSIS — S199XXA Unspecified injury of neck, initial encounter: Secondary | ICD-10-CM | POA: Diagnosis not present

## 2015-10-07 DIAGNOSIS — Y939 Activity, unspecified: Secondary | ICD-10-CM | POA: Insufficient documentation

## 2015-10-07 DIAGNOSIS — Y999 Unspecified external cause status: Secondary | ICD-10-CM | POA: Diagnosis not present

## 2015-10-07 DIAGNOSIS — S299XXA Unspecified injury of thorax, initial encounter: Secondary | ICD-10-CM | POA: Diagnosis not present

## 2015-10-07 MED ORDER — ACETAMINOPHEN 325 MG PO TABS
650.0000 mg | ORAL_TABLET | Freq: Once | ORAL | Status: AC
  Administered 2015-10-07: 650 mg via ORAL
  Filled 2015-10-07: qty 2

## 2015-10-07 MED ORDER — NAPROXEN 500 MG PO TABS: 500.0000 mg | ORAL_TABLET | Freq: Two times a day (BID) | ORAL | 0 refills | Status: DC

## 2015-10-07 MED ORDER — CYCLOBENZAPRINE HCL 10 MG PO TABS: 10.0000 mg | ORAL_TABLET | Freq: Two times a day (BID) | ORAL | 0 refills | Status: DC | PRN
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
## 2015-10-07 NOTE — ED Notes (Signed)
Pt verbalized understanding of d/c instructions and has no further questions. Pt stable and NAD.  

## 2015-10-07 NOTE — ED Notes (Signed)
Pt transported to CT ?

## 2015-10-07 NOTE — Discharge Instructions (Signed)
Follow-up with your primary care doctor, expect to be stiff and sore for the next week, return as needed for worsening symptoms
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
## 2015-10-14 DIAGNOSIS — M5137 Other intervertebral disc degeneration, lumbosacral region: Secondary | ICD-10-CM | POA: Diagnosis not present

## 2015-10-14 DIAGNOSIS — M170 Bilateral primary osteoarthritis of knee: Secondary | ICD-10-CM | POA: Diagnosis not present

## 2015-11-03 ENCOUNTER — Encounter: Payer: Self-pay | Admitting: Adult Health

## 2015-11-03 ENCOUNTER — Ambulatory Visit (INDEPENDENT_AMBULATORY_CARE_PROVIDER_SITE_OTHER): Payer: PPO | Admitting: Adult Health

## 2015-11-03 VITALS — BP 110/70 | Temp 98.6°F | Ht 67.0 in | Wt 223.7 lb

## 2015-11-03 DIAGNOSIS — E1165 Type 2 diabetes mellitus with hyperglycemia: Secondary | ICD-10-CM

## 2015-11-03 DIAGNOSIS — IMO0001 Reserved for inherently not codable concepts without codable children: Secondary | ICD-10-CM

## 2015-11-03 LAB — POCT GLYCOSYLATED HEMOGLOBIN (HGB A1C): Hemoglobin A1C: 6.9

## 2015-11-03 MED ORDER — PRECISION XTRA MONITOR DEVI: 0 refills | Status: DC

## 2015-11-03 MED ORDER — GLUCOSE BLOOD VI STRP
ORAL_STRIP | 12 refills | Status: DC
Start: 2015-11-03 — End: 2016-01-27
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
## 2015-11-03 NOTE — Patient Instructions (Addendum)
It was great seeing you today!  You have lost two pounds. Keep it up, start exercising again and cut back on carbs and sugars.   Follow up with me in three months for your complete physical exam   Your A1c today is 6.9. You were 7.1 the last time you were here    Diabetes Mellitus and Food It is important for you to manage your blood sugar (glucose) level. Your blood glucose level can be greatly affected by what you eat. Eating healthier foods in the appropriate amounts throughout the day at about the same time each day will help you control your blood glucose level. It can also help slow or prevent worsening of your diabetes mellitus. Healthy eating may even help you improve the level of your blood pressure and reach or maintain a healthy weight.  General recommendations for healthful eating and cooking habits include:  Eating meals and snacks regularly. Avoid going long periods of time without eating to lose weight.  Eating a diet that consists mainly of plant-based foods, such as fruits, vegetables, nuts, legumes, and whole grains.  Using low-heat cooking methods, such as baking, instead of high-heat cooking methods, such as deep frying. Work with your dietitian to make sure you understand how to use the Nutrition Facts information on food labels. HOW CAN FOOD AFFECT ME? Carbohydrates Carbohydrates affect your blood glucose level more than any other type of food. Your dietitian will help you determine how many carbohydrates to eat at each meal and teach you how to count carbohydrates. Counting carbohydrates is important to keep your blood glucose at a healthy level, especially if you are using insulin or taking certain medicines for diabetes mellitus. Alcohol Alcohol can cause sudden decreases in blood glucose (hypoglycemia), especially if you use insulin or take certain medicines for diabetes mellitus. Hypoglycemia can be a life-threatening condition. Symptoms of hypoglycemia (sleepiness,  dizziness, and disorientation) are similar to symptoms of having too much alcohol.  If your health care provider has given you approval to drink alcohol, do so in moderation and use the following guidelines:  Women should not have more than one drink per day, and men should not have more than two drinks per day. One drink is equal to:  12 oz of beer.  5 oz of wine.  1 oz of hard liquor.  Do not drink on an empty stomach.  Keep yourself hydrated. Have water, diet soda, or unsweetened iced tea.  Regular soda, juice, and other mixers might contain a lot of carbohydrates and should be counted. WHAT FOODS ARE NOT RECOMMENDED? As you make food choices, it is important to remember that all foods are not the same. Some foods have fewer nutrients per serving than other foods, even though they might have the same number of calories or carbohydrates. It is difficult to get your body what it needs when you eat foods with fewer nutrients. Examples of foods that you should avoid that are high in calories and carbohydrates but low in nutrients include:  Trans fats (most processed foods list trans fats on the Nutrition Facts label).  Regular soda.  Juice.  Candy.  Sweets, such as cake, pie, doughnuts, and cookies.  Fried foods. WHAT FOODS CAN I EAT? Eat nutrient-rich foods, which will nourish your body and keep you healthy. The food you should eat also will depend on several factors, including:  The calories you need.  The medicines you take.  Your weight.  Your blood glucose level.  Your  blood pressure level.  Your cholesterol level. You should eat a variety of foods, including:  Protein.  Lean cuts of meat.  Proteins low in saturated fats, such as fish, egg whites, and beans. Avoid processed meats.  Fruits and vegetables.  Fruits and vegetables that may help control blood glucose levels, such as apples, mangoes, and yams.  Dairy products.  Choose fat-free or low-fat dairy  products, such as milk, yogurt, and cheese.  Grains, bread, pasta, and rice.  Choose whole grain products, such as multigrain bread, whole oats, and brown rice. These foods may help control blood pressure.  Fats.  Foods containing healthful fats, such as nuts, avocado, olive oil, canola oil, and fish. DOES EVERYONE WITH DIABETES MELLITUS HAVE THE SAME MEAL PLAN? Because every person with diabetes mellitus is different, there is not one meal plan that works for everyone. It is very important that you meet with a dietitian who will help you create a meal plan that is just right for you.   This information is not intended to replace advice given to you by your health care provider. Make sure you discuss any questions you have with your health care provider.   Document Released: 10/26/2004 Document Revised: 02/19/2014 Document Reviewed: 12/26/2012 Elsevier Interactive Patient Education [employer].

## 2015-12-06 ENCOUNTER — Ambulatory Visit: Payer: PPO

## 2015-12-29 ENCOUNTER — Encounter: Payer: PPO | Admitting: Adult Health

## 2016-01-17 DIAGNOSIS — M5416 Radiculopathy, lumbar region: Secondary | ICD-10-CM | POA: Diagnosis not present

## 2016-01-17 DIAGNOSIS — M5412 Radiculopathy, cervical region: Secondary | ICD-10-CM | POA: Diagnosis not present

## 2016-01-17 DIAGNOSIS — Z79899 Other long term (current) drug therapy: Secondary | ICD-10-CM | POA: Diagnosis not present

## 2016-01-17 DIAGNOSIS — F1120 Opioid dependence, uncomplicated: Secondary | ICD-10-CM | POA: Diagnosis not present

## 2016-01-24 ENCOUNTER — Encounter: Payer: Self-pay | Admitting: Adult Health

## 2016-01-24 ENCOUNTER — Ambulatory Visit (INDEPENDENT_AMBULATORY_CARE_PROVIDER_SITE_OTHER): Payer: PPO | Admitting: Adult Health

## 2016-01-24 VITALS — BP 138/72 | Temp 98.2°F | Ht 67.0 in | Wt 226.1 lb

## 2016-01-24 DIAGNOSIS — Z23 Encounter for immunization: Secondary | ICD-10-CM | POA: Diagnosis not present

## 2016-01-24 DIAGNOSIS — I10 Essential (primary) hypertension: Secondary | ICD-10-CM | POA: Diagnosis not present

## 2016-01-24 DIAGNOSIS — E1165 Type 2 diabetes mellitus with hyperglycemia: Secondary | ICD-10-CM

## 2016-01-24 DIAGNOSIS — E559 Vitamin D deficiency, unspecified: Secondary | ICD-10-CM | POA: Diagnosis not present

## 2016-01-24 DIAGNOSIS — G43809 Other migraine, not intractable, without status migrainosus: Secondary | ICD-10-CM

## 2016-01-24 DIAGNOSIS — Z0000 Encounter for general adult medical examination without abnormal findings: Secondary | ICD-10-CM | POA: Diagnosis not present

## 2016-01-24 DIAGNOSIS — IMO0001 Reserved for inherently not codable concepts without codable children: Secondary | ICD-10-CM

## 2016-01-24 LAB — CBC WITH DIFFERENTIAL/PLATELET
BASOS ABS: 0 10*3/uL (ref 0.0–0.1)
BASOS PCT: 0.5 % (ref 0.0–3.0)
EOS ABS: 0.1 10*3/uL (ref 0.0–0.7)
Eosinophils Relative: 0.9 % (ref 0.0–5.0)
HEMATOCRIT: 38.7 % (ref 36.0–46.0)
HEMOGLOBIN: 12.8 g/dL (ref 12.0–15.0)
LYMPHS PCT: 28.8 % (ref 12.0–46.0)
Lymphs Abs: 2.4 10*3/uL (ref 0.7–4.0)
MCHC: 33.2 g/dL (ref 30.0–36.0)
MCV: 85.1 fl (ref 78.0–100.0)
Monocytes Absolute: 0.6 10*3/uL (ref 0.1–1.0)
Monocytes Relative: 6.9 % (ref 3.0–12.0)
Neutro Abs: 5.2 10*3/uL (ref 1.4–7.7)
Neutrophils Relative %: 62.9 % (ref 43.0–77.0)
Platelets: 324 10*3/uL (ref 150.0–400.0)
RBC: 4.55 Mil/uL (ref 3.87–5.11)
RDW: 13.7 % (ref 11.5–15.5)
WBC: 8.3 10*3/uL (ref 4.0–10.5)

## 2016-01-24 LAB — POC URINALSYSI DIPSTICK (AUTOMATED)
Bilirubin, UA: NEGATIVE
Glucose, UA: NEGATIVE
KETONES UA: NEGATIVE
Leukocytes, UA: NEGATIVE
Nitrite, UA: NEGATIVE
PROTEIN UA: NEGATIVE
RBC UA: NEGATIVE
SPEC GRAV UA: 1.025
UROBILINOGEN UA: 0.2
pH, UA: 5

## 2016-01-24 LAB — LIPID PANEL
CHOL/HDL RATIO: 3
Cholesterol: 159 mg/dL (ref 0–200)
HDL: 49.7 mg/dL (ref >39.00)
LDL CALC: 93 mg/dL (ref 0–99)
NONHDL: 109.3
Triglycerides: 83 mg/dL (ref 0.0–149.0)
VLDL: 16.6 mg/dL (ref 0.0–40.0)

## 2016-01-24 LAB — HEPATIC FUNCTION PANEL
ALK PHOS: 73 U/L (ref 39–117)
ALT: 12 U/L (ref 0–35)
AST: 11 U/L (ref 0–37)
Albumin: 4.1 g/dL (ref 3.5–5.2)
BILIRUBIN TOTAL: 0.3 mg/dL (ref 0.2–1.2)
Bilirubin, Direct: 0.1 mg/dL (ref 0.0–0.3)
Total Protein: 6.8 g/dL (ref 6.0–8.3)

## 2016-01-24 LAB — VITAMIN D 25 HYDROXY (VIT D DEFICIENCY, FRACTURES): VITD: 13.9 ng/mL — ABNORMAL LOW (ref 30.00–100.00)

## 2016-01-24 LAB — HEMOGLOBIN A1C: HEMOGLOBIN A1C: 6.8 % — AB (ref 4.6–6.5)

## 2016-01-24 LAB — BASIC METABOLIC PANEL
BUN: 8 mg/dL (ref 6–23)
CHLORIDE: 106 meq/L (ref 96–112)
CO2: 27 mEq/L (ref 19–32)
CREATININE: 0.75 mg/dL (ref 0.40–1.20)
Calcium: 9.5 mg/dL (ref 8.4–10.5)
GFR: 104.65 mL/min (ref >60.00)
Glucose, Bld: 113 mg/dL — ABNORMAL HIGH (ref 70–99)
POTASSIUM: 4 meq/L (ref 3.5–5.1)
Sodium: 140 mEq/L (ref 135–145)

## 2016-01-24 LAB — TSH: TSH: 1.08 u[IU]/mL (ref 0.35–4.50)

## 2016-01-24 MED ORDER — RIZATRIPTAN BENZOATE 5 MG PO TABS: 5.0000 mg | ORAL_TABLET | ORAL | 3 refills | Status: DC | PRN

## 2016-01-24 MED ORDER — LISINOPRIL 5 MG PO TABS: 5.0000 mg | ORAL_TABLET | Freq: Every day | ORAL | 3 refills | Status: DC

## 2016-01-24 NOTE — Progress Notes (Signed)
Subjective:    Patient ID: Karen Hardin, female    DOB: Dec 13, 1965, 50 y.o.   MRN: [id number]  HPI  Patient presents for yearly preventative medicine examination. She is a pleasant 50 year old female who  has a past medical history of Anemia; Arthritis; Asthma (2002); Depression; Fibrocystic breast (2005); LGSIL (low grade squamous intraepithelial lesion) on Pap smear (2007); Libido, decreased (2010); Migraines; Ovarian cyst; Stargardt's disease (1986); Tachycardia; Trichomonas; Vaginosis (2008); and Vitamin D deficiency.  All immunizations and health maintenance protocols were reviewed with the patient and needed orders were placed.  Appropriate screening laboratory values were ordered for the patient including screening of hyperlipidemia, renal function and hepatic function.  Medication reconciliation,  past medical history, social history, problem list and allergies were reviewed in detail with the patient  Goals were established with regard to weight loss, exercise, and  diet in compliance with medications. She is newly diagnosed diabetes, her last A1c was 6.9 . She is not exercising but has cut back on her sugary drinks. Her home meter shows that of blood sugar readings between 80-160.     Lab Results  Component Value Date   HGBA1C 6.9 11/03/2015   BP Readings from Last 3 Encounters:  01/24/16 138/72  11/03/15 110/70  10/07/15 136/86      Review of Systems  Constitutional: Negative.   HENT: Negative.   Eyes: Negative.   Respiratory: Negative.   Cardiovascular: Negative.   Gastrointestinal: Negative.   Endocrine: Negative.   Genitourinary: Negative.   Musculoskeletal: Positive for arthralgias, back pain and neck pain (chronic ).  Skin: Negative.   Allergic/Immunologic: Negative.   Hematological: Negative.   Psychiatric/Behavioral: Negative.   All other systems reviewed and are negative.  Past Medical History:  Diagnosis Date  . Anemia   . 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 Asthma 2002  . Depression    as a child, and stress recently  . Fibrocystic breast 2005  . LGSIL (low grade squamous intraepithelial lesion) on Pap smear 2007  . Libido, decreased 2010  . Migraines    migraines - otc med prn, then maxalt if needed  . Ovarian cyst   . 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 Tachycardia    per patient diagnosed in ER 2014, no problems currently  . Trichomonas   . Vaginosis 2008  . Vitamin D deficiency     Social History   Social History  . Marital status: Married    Spouse name: N/A  . Number of children: N/A  . Years of education: N/A   Occupational History  . Not on file.   Social History Main Topics  . Smoking status: Former Smoker    Packs/day: 0.10    Years: 20.00    Start date: 02/28/2015  . Smokeless tobacco: Never Used     Comment: patient uses nicotine gum. Smoked for over 20 years. Quit in 02/2015.  Marland Kitchen Alcohol use No  . Drug use: No  . Sexual activity: Yes    Birth control/ protection: Surgical, Condom   Other Topics Concern  . Not on file   Social History Narrative   Is on disability    Married for 21 years    Three boys ( all three live locally)       She likes to hang out with friends. 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  Past Surgical History:  Procedure Laterality Date  . BREAST SURGERY  1986   biopsy - benign  . CYSTOSCOPY  12/26/2012   Procedure: CYSTOSCOPY;  Surgeon: Ena Dawley, MD;  Location: [facility];  Service: Gynecology;;  . DILATION AND CURETTAGE OF UTERUS    . ganglion cyst removed from wrist     left  . LAPAROSCOPIC ASSISTED VAGINAL HYSTERECTOMY N/A 12/26/2012   Procedure: LAPAROSCOPIC ASSISTED VAGINAL HYSTERECTOMY Uterine Morcellation, Bilateral Salpingectomy, ;  Surgeon: Ena Dawley, MD;  Location: [facility];  Service: Gynecology;  Laterality: N/A;  . MYOMECTOMY     In [place] - laparotomy  .  OOPHORECTOMY  2000   laparotomy in [place]  . SHOULDER ARTHROSCOPY W/ ROTATOR CUFF REPAIR  2006   right  . TONSILLECTOMY AND ADENOIDECTOMY    . TUBAL LIGATION  2000  . WISDOM TOOTH EXTRACTION     x 2 teeth    Family History  Problem Relation Age of Onset  . Arthritis Mother   . Diabetes Mother   . Hypertension Mother   . Pulmonary embolism Mother     Cardiac arrest   . Heart disease Father   . Heart attack Father   . Mesothelioma Father   . Hyperlipidemia Maternal Grandmother   . Breast cancer Maternal Grandmother   . Colitis Paternal Aunt     in 64's  . Heart attack Paternal Aunt   . Heart attack Paternal Uncle   . Stroke Paternal Aunt     No Known Allergies  Current Outpatient Prescriptions on File Prior to Visit  Medication Sig Dispense Refill  . albuterol (PROVENTIL HFA;VENTOLIN HFA) 108 (90 BASE) MCG/ACT inhaler Inhale 2 puffs into the lungs every 4 (four) hours as needed for wheezing. Dispense with aerochamber 1 Inhaler 0  . Blood Glucose Monitoring Suppl (PRECISION XTRA MONITOR) DEVI 1 monitor 1 each 0  . cyclobenzaprine (FLEXERIL) 10 MG tablet Take 1 tablet (10 mg total) by mouth 2 (two) times daily as needed for muscle spasms. 20 tablet 0  . fluticasone (FLOVENT HFA) 44 MCG/ACT inhaler Inhale 2 puffs into the lungs 2 (two) times daily. 1 Inhaler 12  . glucose blood (PRECISION XTRA TEST STRIPS) test strip Use as instructed once a day 100 each 1  . glucose blood test strip Use as instructed 100 each 12  . ibuprofen (ADVIL,MOTRIN) 800 MG tablet Take 1 tablet (800 mg total) by mouth every 8 (eight) hours as needed. 50 tablet 1  . Lancets (ACCU-CHEK MULTICLIX) lancets Use as instructed 100 each 12  . Lancets MISC Use as directed 100 each 1  . olopatadine (PATANOL) 0.1 % ophthalmic solution Place 1 drop into both eyes 2 (two) times daily.    Marland Kitchen OVER THE COUNTER MEDICATION Apply 1 application topically as needed. Theragesic Cream.  Applying to neck & back.    Marland Kitchen  oxyCODONE-acetaminophen (PERCOCET) 10-325 MG tablet Take 1 tablet by mouth every 8 (eight) hours as needed for pain. 60 tablet 0   No current facility-administered medications on file prior to visit.     BP 138/72   Temp 98.2 F (36.8 C) (Oral)   Ht 5\' 7"  (1.702 m)   Wt 226 lb 1.6 oz (102.6 kg)   LMP 12/14/2012   BMI 35.41 kg/m       Objective:   Physical Exam  Constitutional: She is oriented to person, place, and time. She appears well-developed and well-nourished. No distress.  obese  HENT:  Head: Normocephalic and atraumatic.  Right Ear: Hearing, tympanic membrane, external ear and ear canal normal.  Left Ear: Hearing, tympanic membrane and external ear normal.  Nose: Nose normal.  Mouth/Throat: Oropharynx is clear and moist. No oropharyngeal exudate.  Eyes: Conjunctivae and EOM are normal. Pupils are equal, round, and reactive to light. Right eye exhibits no discharge. Left eye exhibits no discharge. No scleral icterus.  Neck: Normal range of motion. Neck supple. No JVD present. Carotid bruit is not present. No tracheal deviation present. No thyromegaly present.  Cardiovascular: Normal rate, regular rhythm, normal heart sounds and intact distal pulses.  Exam reveals no gallop and no friction rub.   No murmur heard. Pulmonary/Chest: Effort normal and breath sounds normal. No stridor. No respiratory distress. She has no wheezes. She has no rales. She exhibits no tenderness.  Abdominal: Soft. Normal appearance and bowel sounds are normal. She exhibits no distension and no mass. There is no tenderness. There is no rebound and no guarding.  Genitourinary:  Genitourinary Comments: Deferred to GYN  Musculoskeletal: Normal range of motion. She exhibits no edema, tenderness or deformity.  Lymphadenopathy:    She has no cervical adenopathy.  Neurological: She is alert and oriented to person, place, and time. She has normal reflexes. She displays normal reflexes. No cranial nerve  deficit. She exhibits normal muscle tone. Coordination normal.  Skin: Skin is warm and dry. No rash noted. She is not diaphoretic. No erythema. No pallor.  Psychiatric: She has a normal mood and affect. Her behavior is normal. Judgment and thought content normal.  Nursing note and vitals reviewed.     Assessment & Plan:  1. Routine general medical examination at a health care facility - Basic metabolic panel - CBC with Differential/Platelet - Hemoglobin A1c - Hepatic function panel - Lipid panel - POCT Urinalysis Dipstick (Automated) - TSH - Vitamin D, 25-hydroxy  2. Other migraine without status migrainosus, not intractable  - rizatriptan (MAXALT) 5 MG tablet; Take 1 tablet (5 mg total) by mouth as needed.  Dispense: 10 tablet; Refill: 3  3. Uncontrolled type 2 diabetes mellitus without complication, without long-term current use of insulin (HCC) - Basic metabolic panel - CBC with Differential/Platelet - Hemoglobin A1c - Hepatic function panel - Lipid panel - POCT Urinalysis Dipstick (Automated) - TSH - Vitamin D, 25-hydroxy - Educated on the importance of diet and exercise - Follow up in 3 months  - Consider adding metformin  4. Essential hypertension - Will start on low dose lisinopril  - lisinopril (PRINIVIL,ZESTRIL) 5 MG tablet; Take 1 tablet (5 mg total) by mouth daily.  Dispense: 90 tablet; Refill: 3 - Basic metabolic panel - CBC with Differential/Platelet - Hemoglobin A1c - Hepatic function panel - Lipid panel - POCT Urinalysis Dipstick (Automated) - TSH - Vitamin D, 25-hydroxy - Monitor BP at home. Return precautions given  5. Vitamin D deficiency  - Vitamin D, 25-hydroxy  6. Need for prophylactic vaccination and inoculation against influenza  - Flu Vaccine QUAD 36+ mos PF IM (Fluarix & Fluzone Quad PF)  7. Need for Tdap vaccination - Tdap given   [name], NP

## 2016-01-24 NOTE — Patient Instructions (Signed)
It was great seeing you today!  I will follow up with you regarding your blood work.   I can not stress how important it is for you to start exercising and eating healthy.   I have added a medication called Lisinopril, this is for high blood pressure and will help protect your kidneys from diabetes.   Please follow up in 3 months

## 2016-01-25 ENCOUNTER — Telehealth: Payer: Self-pay | Admitting: Family Medicine

## 2016-01-25 NOTE — Telephone Encounter (Signed)
Left a message for the pt to return call concerning lab results.

## 2016-01-27 ENCOUNTER — Other Ambulatory Visit: Payer: Self-pay

## 2016-01-27 DIAGNOSIS — IMO0001 Reserved for inherently not codable concepts without codable children: Secondary | ICD-10-CM

## 2016-01-27 DIAGNOSIS — E1165 Type 2 diabetes mellitus with hyperglycemia: Principal | ICD-10-CM

## 2016-01-27 MED ORDER — GLUCOSE BLOOD VI STRP: ORAL_STRIP | 12 refills | Status: DC

## 2016-01-27 MED ORDER — ALBUTEROL SULFATE HFA 108 (90 BASE) MCG/ACT IN AERS: 2.0000 | INHALATION_SPRAY | RESPIRATORY_TRACT | 0 refills | Status: DC | PRN

## 2016-01-27 MED ORDER — ONETOUCH BASIC SYSTEM W/DEVICE KIT: PACK | 0 refills | Status: DC

## 2016-01-27 MED ORDER — LANCETS MISC: 1 refills | Status: DC

## 2016-02-01 ENCOUNTER — Telehealth: Payer: Self-pay | Admitting: Adult Health

## 2016-02-01 ENCOUNTER — Other Ambulatory Visit: Payer: Self-pay

## 2016-02-01 MED ORDER — METFORMIN HCL 500 MG PO TABS: 500.0000 mg | ORAL_TABLET | Freq: Two times a day (BID) | ORAL | 0 refills | Status: DC

## 2016-02-01 NOTE — Telephone Encounter (Signed)
Please call in 500mg  Metformin BID

## 2016-02-01 NOTE — Telephone Encounter (Signed)
Rx has been sent in as directed.  

## 2016-02-01 NOTE — Telephone Encounter (Signed)
°  Pt call to say she thought about it and has decided to take the [place]

## 2016-02-01 NOTE — Telephone Encounter (Signed)
Please advise on dosing and I will send in. Thanks!

## 2016-02-24 DIAGNOSIS — M5416 Radiculopathy, lumbar region: Secondary | ICD-10-CM | POA: Diagnosis not present

## 2016-02-24 DIAGNOSIS — M50222 Other cervical disc displacement at C5-C6 level: Secondary | ICD-10-CM | POA: Diagnosis not present

## 2016-02-24 DIAGNOSIS — M545 Low back pain: Secondary | ICD-10-CM | POA: Diagnosis not present

## 2016-02-24 DIAGNOSIS — M5412 Radiculopathy, cervical region: Secondary | ICD-10-CM | POA: Diagnosis not present

## 2016-02-24 DIAGNOSIS — M50221 Other cervical disc displacement at C4-C5 level: Secondary | ICD-10-CM | POA: Diagnosis not present

## 2016-03-05 ENCOUNTER — Telehealth: Payer: Self-pay | Admitting: Adult Health

## 2016-03-05 NOTE — Telephone Encounter (Signed)
Noted  

## 2016-03-05 NOTE — Telephone Encounter (Signed)
[facility] - Client [place] [facility]  Patient Name: Karen Hardin  DOB: Feb 22, 1965    Initial Comment Caller states, she is on metforman and bp low dose rx. - she is having vomiting and headaches, with weak and dizziness. Verified    Nurse Assessment  Nurse: Wynetta Emery, RN, Baker Janus Date/Time Eilene Ghazi Time): 03/05/2016 11:11:17 AM  Confirm and document reason for call. If symptomatic, describe symptoms. ---has history of headache (migraines) but this is more consistent and feels it may be a side effect of medication she was put on --, she is on metforman and bp low varies up and down - she is having vomiting and headaches, with weak and dizziness. on Friday for this  Does the patient have any new or worsening symptoms? ---Yes  Will a triage be completed? ---Yes  Related visit to physician within the last 2 weeks? ---No  Does the PT have any chronic conditions? (i.e. diabetes, asthma, etc.) ---Yes  List chronic conditions. ---Diabetes 2  Is the patient pregnant or possibly pregnant? (Ask all females between the ages of 25-55) ---No  Is this a behavioral health or substance abuse call? ---No     Guidelines    Guideline Title Affirmed Question Affirmed Notes  Headache [1] MODERATE headache (e.g., interferes with normal activities) AND [2] present > 24 hours AND [3] unexplained (Exceptions: analgesics not tried, typical migraine, or headache part of viral illness)    Final Disposition User   See Physician within [place], RN, Baker Janus    Comments  appt given at 03/06/2016 115pm arrival 130pm appt time with [name], NP   Referrals  REFERRED TO PCP OFFICE   Disagree/Comply: Comply

## 2016-03-06 ENCOUNTER — Ambulatory Visit (INDEPENDENT_AMBULATORY_CARE_PROVIDER_SITE_OTHER): Payer: PPO | Admitting: Adult Health

## 2016-03-06 ENCOUNTER — Encounter: Payer: Self-pay | Admitting: Adult Health

## 2016-03-06 VITALS — BP 142/80 | Temp 97.8°F | Ht 67.0 in | Wt 217.8 lb

## 2016-03-06 DIAGNOSIS — G43809 Other migraine, not intractable, without status migrainosus: Secondary | ICD-10-CM

## 2016-03-06 DIAGNOSIS — I10 Essential (primary) hypertension: Secondary | ICD-10-CM

## 2016-03-06 DIAGNOSIS — IMO0001 Reserved for inherently not codable concepts without codable children: Secondary | ICD-10-CM

## 2016-03-06 DIAGNOSIS — E1165 Type 2 diabetes mellitus with hyperglycemia: Secondary | ICD-10-CM

## 2016-03-06 MED ORDER — AMITRIPTYLINE HCL 25 MG PO TABS: 25.0000 mg | ORAL_TABLET | Freq: Every day | ORAL | 3 refills | Status: DC

## 2016-03-06 MED ORDER — METFORMIN HCL ER (MOD) 500 MG PO TB24: 500.0000 mg | ORAL_TABLET | Freq: Every day | ORAL | 1 refills | Status: DC

## 2016-03-06 NOTE — Telephone Encounter (Signed)
Pt states the  metFORMIN (GLUMETZA) 500 MG (MOD) 24 hr tablet   will need a prior auth.  Advised pt I would let you know and we will keep a watch out for the prior.   [facility]/ [address]

## 2016-03-06 NOTE — Telephone Encounter (Signed)
See below

## 2016-03-06 NOTE — Progress Notes (Signed)
Subjective:    Patient ID: Karen Hardin, female    DOB: August 05, 1965, 51 y.o.   MRN: [id number]  HPI  51 year old female who  has a past medical history of Anemia; Arthritis; Asthma (2002); Depression; Fibrocystic breast (2005); LGSIL (low grade squamous intraepithelial lesion) on Pap smear (2007); Libido, decreased (2010); Migraines; Ovarian cyst; Stargardt's disease (1986); Tachycardia; Trichomonas; Vaginosis (2008); and Vitamin D deficiency.   She reports that since starting her metformin she has had episodes of diarrhea, nausea and vomiting. She reports that her GI issues improved for a few days but then started up again. She has had instances of forgetting to take her metformin twice a day.   She reports that over the weekend her blood pressure reading  90/50's. She was feeling ill over the weekend and was not staying hydrated.   She is also reporting that over the last few weeks her migraines have been more frequent. She is having to take her Maxalt and Motrin daily.   On a good note, she has changed her diet and is becoming more active. She was able to lose about 9 pounds.   Wt Readings from Last 3 Encounters:  03/06/16 217 lb 12.8 oz (98.8 kg)  01/24/16 226 lb 1.6 oz (102.6 kg)  11/03/15 223 lb 11.2 oz (101.5 kg)    Review of Systems  Constitutional: Positive for activity change and appetite change. Negative for chills, diaphoresis and fever.  HENT: Negative.   Respiratory: Negative.   Cardiovascular: Negative.   Gastrointestinal: Positive for diarrhea, nausea and vomiting. Negative for abdominal pain, blood in stool and constipation.  Genitourinary: Negative.   Musculoskeletal: Negative.   Neurological: Positive for dizziness and headaches.  Hematological: Negative.   Psychiatric/Behavioral: Negative.   All other systems reviewed and are negative.  Past Medical History:  Diagnosis Date  . Anemia   . 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 Asthma  2002  . Depression    as a child, and stress recently  . Fibrocystic breast 2005  . LGSIL (low grade squamous intraepithelial lesion) on Pap smear 2007  . Libido, decreased 2010  . Migraines    migraines - otc med prn, then maxalt if needed  . Ovarian cyst   . 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 Tachycardia    per patient diagnosed in ER 2014, no problems currently  . Trichomonas   . Vaginosis 2008  . Vitamin D deficiency     Social History   Social History  . Marital status: Married    Spouse name: N/A  . Number of children: N/A  . Years of education: N/A   Occupational History  . Not on file.   Social History Main Topics  . Smoking status: Former Smoker    Packs/day: 0.10    Years: 20.00    Start date: 02/28/2015  . Smokeless tobacco: Never Used     Comment: patient uses nicotine gum. Smoked for over 20 years. Quit in 02/2015.  Marland Kitchen Alcohol use No  . Drug use: No  . Sexual activity: Yes    Birth control/ protection: Surgical, Condom   Other Topics Concern  . Not on file   Social History Narrative   Is on disability    Married for 21 years    Three boys ( all three live locally)       She likes to hang out with friends. 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     Past Surgical History:  Procedure Laterality Date  . BREAST SURGERY  1986   biopsy - benign  . CYSTOSCOPY  12/26/2012   Procedure: CYSTOSCOPY;  Surgeon: Ena Dawley, MD;  Location: [facility];  Service: Gynecology;;  . DILATION AND CURETTAGE OF UTERUS    . ganglion cyst removed from wrist     left  . LAPAROSCOPIC ASSISTED VAGINAL HYSTERECTOMY N/A 12/26/2012   Procedure: LAPAROSCOPIC ASSISTED VAGINAL HYSTERECTOMY Uterine Morcellation, Bilateral Salpingectomy, ;  Surgeon: Ena Dawley, MD;  Location: [facility];  Service: Gynecology;  Laterality: N/A;  . MYOMECTOMY     In [place] - laparotomy  . OOPHORECTOMY  2000   laparotomy in [place]  .  SHOULDER ARTHROSCOPY W/ ROTATOR CUFF REPAIR  2006   right  . TONSILLECTOMY AND ADENOIDECTOMY    . TUBAL LIGATION  2000  . WISDOM TOOTH EXTRACTION     x 2 teeth    Family History  Problem Relation Age of Onset  . Arthritis Mother   . Diabetes Mother   . Hypertension Mother   . Pulmonary embolism Mother     Cardiac arrest   . Heart disease Father   . Heart attack Father   . Mesothelioma Father   . Hyperlipidemia Maternal Grandmother   . Breast cancer Maternal Grandmother   . Colitis Paternal Aunt     in 44's  . Heart attack Paternal Aunt   . Heart attack Paternal Uncle   . Stroke Paternal Aunt     No Known Allergies  Current Outpatient Prescriptions on File Prior to Visit  Medication Sig Dispense Refill  . albuterol (PROVENTIL HFA;VENTOLIN HFA) 108 (90 Base) MCG/ACT inhaler Inhale 2 puffs into the lungs every 4 (four) hours as needed for wheezing. Dispense with aerochamber 1 Inhaler 0  . Blood Glucose Monitoring Suppl (Freedom) w/Device KIT Test once daily. Dx: E11.9 1 each 0  . Blood Glucose Monitoring Suppl (PRECISION XTRA MONITOR) DEVI 1 monitor 1 each 0  . cyclobenzaprine (FLEXERIL) 10 MG tablet Take 1 tablet (10 mg total) by mouth 2 (two) times daily as needed for muscle spasms. 20 tablet 0  . fluticasone (FLOVENT HFA) 44 MCG/ACT inhaler Inhale 2 puffs into the lungs 2 (two) times daily. 1 Inhaler 12  . glucose blood (PRECISION XTRA TEST STRIPS) test strip Use as instructed once a day 100 each 1  . glucose blood test strip Use as instructed 100 each 12  . ibuprofen (ADVIL,MOTRIN) 800 MG tablet Take 1 tablet (800 mg total) by mouth every 8 (eight) hours as needed. 50 tablet 1  . Lancets (ACCU-CHEK MULTICLIX) lancets Use as instructed 100 each 12  . Lancets MISC Use as directed 100 each 1  . olopatadine (PATANOL) 0.1 % ophthalmic solution Place 1 drop into both eyes 2 (two) times daily.    Marland Kitchen OVER THE COUNTER MEDICATION Apply 1 application topically as needed.  Theragesic Cream.  Applying to neck & back.    Marland Kitchen oxyCODONE-acetaminophen (PERCOCET) 10-325 MG tablet Take 1 tablet by mouth every 8 (eight) hours as needed for pain. 60 tablet 0  . rizatriptan (MAXALT) 5 MG tablet Take 1 tablet (5 mg total) by mouth as needed. 10 tablet 3  . lisinopril (PRINIVIL,ZESTRIL) 5 MG tablet Take 1 tablet (5 mg total) by mouth daily. (Patient not taking: Reported on 03/06/2016) 90 tablet 3  . [DISCONTINUED] metFORMIN (GLUCOPHAGE) 500 MG  tablet Take 1 tablet (500 mg total) by mouth 2 (two) times daily with a meal. (Patient not taking: Reported on 03/06/2016) 180 tablet 0   No current facility-administered medications on file prior to visit.     BP (!) 142/80   Temp 97.8 F (36.6 C) (Oral)   Ht '5\' 7"'  (1.702 m)   Wt 217 lb 12.8 oz (98.8 kg)   LMP 12/14/2012   BMI 34.11 kg/m       Objective:   Physical Exam  Constitutional: She is oriented to person, place, and time. She appears well-developed and well-nourished. No distress.  Cardiovascular: Normal rate, regular rhythm, normal heart sounds and intact distal pulses.  Exam reveals no gallop and no friction rub.   No murmur heard. Pulmonary/Chest: Effort normal and breath sounds normal. No respiratory distress. She has no wheezes. She has no rales. She exhibits no tenderness.  Abdominal: Soft. Bowel sounds are normal. She exhibits no distension and no mass. There is no tenderness. There is no rebound and no guarding.  Neurological: She is alert and oriented to person, place, and time.  Skin: Skin is warm and dry. No rash noted. She is not diaphoretic. No erythema. No pallor.  Psychiatric: She has a normal mood and affect. Her behavior is normal. Judgment and thought content normal.  Nursing note and vitals reviewed.     Assessment & Plan:  1. Other migraine without status migrainosus, not intractable - Will start on low dose Elavil as migraine prophylaxis  - amitriptyline (ELAVIL) 25 MG tablet; Take 1 tablet (25  mg total) by mouth at bedtime.  Dispense: 30 tablet; Refill: 3  2. Uncontrolled type 2 diabetes mellitus without complication, without long-term current use of insulin (HCC) - Will change to ER metformin to see if this helps with GI issues.  - metFORMIN (GLUMETZA) 500 MG (MOD) 24 hr tablet; Take 1 tablet (500 mg total) by mouth daily with breakfast.  Dispense: 30 tablet; Refill: 1 - Continue to work on diet and exercise - Follow up in two months or sooner if needed  3. Essential hypertension - Stay well hydrated - potential hypotension episodes due in partial to dehydration  - Keep current dose of Lisinopril  - Monitor more closely at home  - Can send me readings via mychart   [name], NP

## 2016-03-08 NOTE — Telephone Encounter (Signed)
PA submitted & is pending. Key: [id number]

## 2016-03-15 DIAGNOSIS — M5020 Other cervical disc displacement, unspecified cervical region: Secondary | ICD-10-CM | POA: Diagnosis not present

## 2016-03-15 DIAGNOSIS — J45909 Unspecified asthma, uncomplicated: Secondary | ICD-10-CM | POA: Diagnosis not present

## 2016-03-15 DIAGNOSIS — M5416 Radiculopathy, lumbar region: Secondary | ICD-10-CM | POA: Diagnosis not present

## 2016-03-15 DIAGNOSIS — M5412 Radiculopathy, cervical region: Secondary | ICD-10-CM | POA: Diagnosis not present

## 2016-03-15 NOTE — Telephone Encounter (Signed)
Received paperwork from insurance company asking for more information. Form filled out & faxed back.

## 2016-03-16 NOTE — Telephone Encounter (Signed)
PA approved, form faxed back to pharmacy. 

## 2016-03-28 DIAGNOSIS — M5412 Radiculopathy, cervical region: Secondary | ICD-10-CM | POA: Diagnosis not present

## 2016-03-28 DIAGNOSIS — Z6832 Body mass index (BMI) 32.0-32.9, adult: Secondary | ICD-10-CM | POA: Diagnosis not present

## 2016-03-28 DIAGNOSIS — M5020 Other cervical disc displacement, unspecified cervical region: Secondary | ICD-10-CM | POA: Diagnosis not present

## 2016-04-17 DIAGNOSIS — M5416 Radiculopathy, lumbar region: Secondary | ICD-10-CM | POA: Diagnosis not present

## 2016-04-17 DIAGNOSIS — Z6832 Body mass index (BMI) 32.0-32.9, adult: Secondary | ICD-10-CM | POA: Diagnosis not present

## 2016-04-17 DIAGNOSIS — M5126 Other intervertebral disc displacement, lumbar region: Secondary | ICD-10-CM | POA: Diagnosis not present

## 2016-04-24 ENCOUNTER — Ambulatory Visit (INDEPENDENT_AMBULATORY_CARE_PROVIDER_SITE_OTHER): Payer: PPO | Admitting: Adult Health

## 2016-04-24 VITALS — BP 130/78 | Temp 98.6°F | Wt 222.0 lb

## 2016-04-24 DIAGNOSIS — R079 Chest pain, unspecified: Secondary | ICD-10-CM

## 2016-04-24 DIAGNOSIS — G43809 Other migraine, not intractable, without status migrainosus: Secondary | ICD-10-CM | POA: Diagnosis not present

## 2016-04-24 DIAGNOSIS — IMO0001 Reserved for inherently not codable concepts without codable children: Secondary | ICD-10-CM

## 2016-04-24 DIAGNOSIS — E1165 Type 2 diabetes mellitus with hyperglycemia: Secondary | ICD-10-CM

## 2016-04-24 LAB — POCT GLYCOSYLATED HEMOGLOBIN (HGB A1C): Hemoglobin A1C: 6.9

## 2016-04-24 MED ORDER — METFORMIN HCL ER 500 MG PO TB24: 500.0000 mg | ORAL_TABLET | Freq: Every day | ORAL | 3 refills | Status: DC

## 2016-04-24 MED ORDER — GLIPIZIDE 5 MG PO TABS
5.0000 mg | ORAL_TABLET | Freq: Two times a day (BID) | ORAL | 3 refills | Status: DC
Start: 2016-04-24 — End: 2017-03-29

## 2016-04-24 NOTE — Progress Notes (Signed)
Subjective:    Patient ID: Karen Hardin, female    DOB: 07-28-1965, 51 y.o.   MRN: [id number]  HPI  51 year old female who presents to the clinic today for follow up regarding migraines and  uncontrolled diabetes.   During her last visit her A1c was up slightly to 6.8. She reported that the metformin she was taking was diarrhea. She was switched to ER. She has not been taking this due to cost of over $600.   She was also placed on a low dose of Elavil nightly for prophylactic management of migraines. She reports that since starting on Elavil she has not had any migraines.   She does report an episodes of mid sternal chest pain last night. She feels as though this may have been acid reflux. No pain with radiation up jaw line or down arm. She did have pain in her mid back. Has been waking up with a sour taste in her mouth. No chest pain since.   Wt Readings from Last 3 Encounters:  04/24/16 222 lb (100.7 kg)  03/06/16 217 lb 12.8 oz (98.8 kg)  01/24/16 226 lb 1.6 oz (102.6 kg)     Review of Systems See HPI   Past Medical History:  Diagnosis Date  . Anemia   . 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 Asthma 2002  . Depression    as a child, and stress recently  . Fibrocystic breast 2005  . LGSIL (low grade squamous intraepithelial lesion) on Pap smear 2007  . Libido, decreased 2010  . Migraines    migraines - otc med prn, then maxalt if needed  . Ovarian cyst   . 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 Tachycardia    per patient diagnosed in ER 2014, no problems currently  . Trichomonas   . Vaginosis 2008  . Vitamin D deficiency     Social History   Social History  . Marital status: Married    Spouse name: N/A  . Number of children: N/A  . Years of education: N/A   Occupational History  . Not on file.   Social History Main Topics  . Smoking status: Former Smoker    Packs/day: 0.10    Years: 20.00    Start date: 02/28/2015   . Smokeless tobacco: Never Used     Comment: patient uses nicotine gum. Smoked for over 20 years. Quit in 02/2015.  Marland Kitchen Alcohol use No  . Drug use: No  . Sexual activity: Yes    Birth control/ protection: Surgical, Condom   Other Topics Concern  . Not on file   Social History Narrative   Is on disability    Married for 21 years    Three boys ( all three live locally)       She likes to hang out with friends. 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     Past Surgical History:  Procedure Laterality Date  . BREAST SURGERY  1986   biopsy - benign  . CYSTOSCOPY  12/26/2012   Procedure: CYSTOSCOPY;  Surgeon: Ena Dawley, MD;  Location: [facility];  Service: Gynecology;;  . DILATION AND CURETTAGE OF UTERUS    . ganglion cyst removed from wrist     left  . LAPAROSCOPIC ASSISTED VAGINAL HYSTERECTOMY N/A 12/26/2012   Procedure: LAPAROSCOPIC ASSISTED VAGINAL HYSTERECTOMY Uterine Morcellation, Bilateral Salpingectomy, ;  Surgeon: Arnell Sieving  Raphael Gibney, MD;  Location: [facility];  Service: Gynecology;  Laterality: N/A;  . MYOMECTOMY     In [place] - laparotomy  . OOPHORECTOMY  2000   laparotomy in [place]  . SHOULDER ARTHROSCOPY W/ ROTATOR CUFF REPAIR  2006   right  . TONSILLECTOMY AND ADENOIDECTOMY    . TUBAL LIGATION  2000  . WISDOM TOOTH EXTRACTION     x 2 teeth    Family History  Problem Relation Age of Onset  . Arthritis Mother   . Diabetes Mother   . Hypertension Mother   . Pulmonary embolism Mother     Cardiac arrest   . Heart disease Father   . Heart attack Father   . Mesothelioma Father   . Hyperlipidemia Maternal Grandmother   . Breast cancer Maternal Grandmother   . Colitis Paternal Aunt     in 61's  . Heart attack Paternal Aunt   . Heart attack Paternal Uncle   . Stroke Paternal Aunt     No Known Allergies  Current Outpatient Prescriptions on File Prior to Visit  Medication Sig Dispense Refill  . albuterol (PROVENTIL  HFA;VENTOLIN HFA) 108 (90 Base) MCG/ACT inhaler Inhale 2 puffs into the lungs every 4 (four) hours as needed for wheezing. Dispense with aerochamber 1 Inhaler 0  . amitriptyline (ELAVIL) 25 MG tablet Take 1 tablet (25 mg total) by mouth at bedtime. 30 tablet 3  . Blood Glucose Monitoring Suppl ([place]) w/Device KIT Test once daily. Dx: E11.9 1 each 0  . Blood Glucose Monitoring Suppl (PRECISION XTRA MONITOR) DEVI 1 monitor 1 each 0  . cyclobenzaprine (FLEXERIL) 10 MG tablet Take 1 tablet (10 mg total) by mouth 2 (two) times daily as needed for muscle spasms. 20 tablet 0  . fluticasone (FLOVENT HFA) 44 MCG/ACT inhaler Inhale 2 puffs into the lungs 2 (two) times daily. 1 Inhaler 12  . glucose blood (PRECISION XTRA TEST STRIPS) test strip Use as instructed once a day 100 each 1  . glucose blood test strip Use as instructed 100 each 12  . ibuprofen (ADVIL,MOTRIN) 800 MG tablet Take 1 tablet (800 mg total) by mouth every 8 (eight) hours as needed. 50 tablet 1  . Lancets (ACCU-CHEK MULTICLIX) lancets Use as instructed 100 each 12  . Lancets MISC Use as directed 100 each 1  . lisinopril (PRINIVIL,ZESTRIL) 5 MG tablet Take 1 tablet (5 mg total) by mouth daily. (Patient not taking: Reported on 03/06/2016) 90 tablet 3  . metFORMIN (GLUMETZA) 500 MG (MOD) 24 hr tablet Take 1 tablet (500 mg total) by mouth daily with breakfast. 30 tablet 1  . olopatadine (PATANOL) 0.1 % ophthalmic solution Place 1 drop into both eyes 2 (two) times daily.    Marland Kitchen OVER THE COUNTER MEDICATION Apply 1 application topically as needed. Theragesic Cream.  Applying to neck & back.    Marland Kitchen oxyCODONE-acetaminophen (PERCOCET) 10-325 MG tablet Take 1 tablet by mouth every 8 (eight) hours as needed for pain. 60 tablet 0  . rizatriptan (MAXALT) 5 MG tablet Take 1 tablet (5 mg total) by mouth as needed. 10 tablet 3   No current facility-administered medications on file prior to visit.     LMP 12/14/2012       Objective:    Physical Exam  Constitutional: She is oriented to person, place, and time. She appears well-developed and well-nourished. No distress.  Cardiovascular: Normal rate, regular rhythm, normal heart sounds and intact distal pulses.  Exam reveals  no gallop and no friction rub.   No murmur heard. Pulmonary/Chest: Effort normal and breath sounds normal. No respiratory distress. She has no wheezes. She has no rales. She exhibits no tenderness.  Abdominal: Soft. Bowel sounds are normal. She exhibits no mass. There is tenderness in the epigastric area. There is no rebound and no guarding.  Neurological: She is alert and oriented to person, place, and time.  Skin: Skin is warm and dry. No rash noted. She is not diaphoretic. No erythema. No pallor.  Psychiatric: She has a normal mood and affect. Her behavior is normal. Judgment and thought content normal.  Nursing note and vitals reviewed.     Assessment & Plan:  1. Chest pain, unspecified type  - EKG 12-Lead- SR, rate 96.  - Probably GERD related chest pain  - Will have her try OTC omeprazole for one month   2. Other migraine without status migrainosus, not intractable - Resolved with Elavil. No change in medication at this moment   3. Uncontrolled type 2 diabetes mellitus without complication, without long-term current use of insulin (HCC) - ER Metformin is on 4$ list at [facility]. Will print of prescription for her to take to [facility].If this is not the case then can start Glipizide  - glipiZIDE (GLUCOTROL) 5 MG tablet; Take 1 tablet (5 mg total) by mouth 2 (two) times daily before a meal.  Dispense: 60 tablet; Refill: 3 - metFORMIN (GLUCOPHAGE-XR) 500 MG 24 hr tablet; Take 1 tablet (500 mg total) by mouth daily with breakfast.  Dispense: 90 tablet; Refill: 3 - Follow up in 3 months or sooner if needed  [name], NP

## 2016-04-24 NOTE — Addendum Note (Signed)
Addended by: Sandria Bales B on: 04/24/2016 02:21 PM   Modules accepted: Orders

## 2016-05-02 ENCOUNTER — Other Ambulatory Visit: Payer: Self-pay | Admitting: Family Medicine

## 2016-05-07 NOTE — Telephone Encounter (Signed)
Ok to refill for 30 days  

## 2016-08-01 ENCOUNTER — Ambulatory Visit: Payer: PPO | Admitting: Physician Assistant

## 2016-08-27 DIAGNOSIS — Z01411 Encounter for gynecological examination (general) (routine) with abnormal findings: Secondary | ICD-10-CM | POA: Diagnosis not present

## 2016-08-27 DIAGNOSIS — N909 Noninflammatory disorder of vulva and perineum, unspecified: Secondary | ICD-10-CM | POA: Diagnosis not present

## 2016-08-27 DIAGNOSIS — Z6832 Body mass index (BMI) 32.0-32.9, adult: Secondary | ICD-10-CM | POA: Diagnosis not present

## 2016-09-04 DIAGNOSIS — B001 Herpesviral vesicular dermatitis: Secondary | ICD-10-CM | POA: Diagnosis not present
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
## 2016-09-07 ENCOUNTER — Telehealth: Payer: Self-pay | Admitting: Cardiology

## 2016-09-07 NOTE — Telephone Encounter (Signed)
Spoke with pt after reviewing pt's records Pt had  gxt done last February showed hypertensive response with exercise only and event monitor was normal Suggested to pt to contact pmd as this does not sound cardiac and if pmd wants pt evaluated  Will call back and schedule appt . Will forward to Dr Marlou Porch for review .Adonis Housekeeper

## 2016-09-07 NOTE — Telephone Encounter (Signed)
New Message     Pt is having periodic pain under left arm by her breast , hot flashes, she is having pain in the top of her shoulder, she does not know if it is due to the pain in her neck from disc and back problems?    Pt states she has some tachycardia but she is not having these symptoms, when it happens .   Pt thinks she was tachycardia event yesterday, her heart rate was 120 when sitting , it last for just a couple seconds then went away.    I have her set up an appt with Cecilie Kicks on Aug 9, 18

## 2016-09-08 NOTE — Telephone Encounter (Signed)
Agree. Prior workup reassuring.  Candee Furbish, MD

## 2016-09-13 NOTE — Telephone Encounter (Signed)
Looking up telephone number.   [facility] 

## 2016-09-20 ENCOUNTER — Ambulatory Visit: Payer: PPO | Admitting: Cardiology

## 2016-10-04 ENCOUNTER — Other Ambulatory Visit: Payer: Self-pay | Admitting: Adult Health

## 2016-10-04 NOTE — Telephone Encounter (Signed)
Filled for 1 year 01/2016.  Refill request is too soon.  Pt testing once daily.

## 2016-10-22 ENCOUNTER — Other Ambulatory Visit: Payer: Self-pay | Admitting: Adult Health

## 2016-10-23 ENCOUNTER — Other Ambulatory Visit: Payer: Self-pay | Admitting: Obstetrics and Gynecology

## 2016-10-23 DIAGNOSIS — Z1231 Encounter for screening mammogram for malignant neoplasm of breast: Secondary | ICD-10-CM

## 2016-11-01 ENCOUNTER — Ambulatory Visit
Admission: RE | Admit: 2016-11-01 | Discharge: 2016-11-01 | Disposition: A | Discharge status: Home / Self Care | Payer: PPO | Source: Ambulatory Visit | Attending: Obstetrics and Gynecology | Admitting: Obstetrics and Gynecology

## 2016-11-01 DIAGNOSIS — Z1231 Encounter for screening mammogram for malignant neoplasm of breast: Secondary | ICD-10-CM | POA: Diagnosis not present

## 2016-11-02 ENCOUNTER — Encounter: Payer: Self-pay | Admitting: Adult Health

## 2017-01-01 ENCOUNTER — Other Ambulatory Visit: Payer: Self-pay | Admitting: Adult Health

## 2017-01-01 NOTE — Telephone Encounter (Signed)
Sent to the pharmacy by e-scribe. 

## 2017-01-30 ENCOUNTER — Telehealth: Payer: Self-pay | Admitting: *Deleted

## 2017-01-30 NOTE — Telephone Encounter (Signed)
[facility] recommends patient be prescribed a statin due to having a diagnosis of diabetes. If you agree, please advise medication and I will contact patient and send to pharmacy.  Thanks! 

## 2017-01-30 NOTE — Telephone Encounter (Signed)
Her last cholesterol panel was normal. She does not need to be on a statin at this time   Thank you for your assistance in this matter

## 2017-01-30 NOTE — Telephone Encounter (Signed)
Noted  

## 2017-02-27 ENCOUNTER — Ambulatory Visit: Payer: PPO | Admitting: Family Medicine

## 2017-02-28 ENCOUNTER — Encounter: Payer: Self-pay | Admitting: Adult Health

## 2017-02-28 ENCOUNTER — Ambulatory Visit (INDEPENDENT_AMBULATORY_CARE_PROVIDER_SITE_OTHER): Payer: PPO | Admitting: Adult Health

## 2017-02-28 VITALS — BP 110/78 | HR 96 | Temp 97.7°F | Ht 67.0 in | Wt 207.1 lb

## 2017-02-28 DIAGNOSIS — I10 Essential (primary) hypertension: Secondary | ICD-10-CM

## 2017-02-28 DIAGNOSIS — M25512 Pain in left shoulder: Secondary | ICD-10-CM | POA: Diagnosis not present

## 2017-02-28 DIAGNOSIS — M79622 Pain in left upper arm: Secondary | ICD-10-CM

## 2017-02-28 MED ORDER — LISINOPRIL 2.5 MG PO TABS: 2.5000 mg | ORAL_TABLET | Freq: Every day | ORAL | 1 refills | Status: DC

## 2017-02-28 NOTE — Progress Notes (Signed)
Subjective:    Patient ID: Karen Hardin, female    DOB: 05-Dec-1965, 52 y.o.   MRN: [id number]  HPI  52 year old female who  has a past medical history of Anemia, Arthritis, Asthma (2002), Depression, Fibrocystic breast (2005), LGSIL (low grade squamous intraepithelial lesion) on Pap smear (2007), Libido, decreased (2010), Migraines, Ovarian cyst, Stargardt's disease (1986), Tachycardia, Trichomonas, Vaginosis (2008), and Vitamin D deficiency.  She presents to the office today with the complaint of " muscle aches". Her muscle aches have been intermittent for the last 3-4 weeks. The aches are located under the left axilla. She denies any trauma or doing any exercises on a consistent basis and has not done any exercising over the last 3-4 weeks. She notices more discomfort with house chores such as sweeping to vacuuming.   She will use a muscle relaxer or motrin when she is experiencing the pain and endorses resolution of pain for a day or two.   Review of Systems   See HPI   Past Medical History:  Diagnosis Date  . Anemia   . 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 Asthma 2002  . Depression    as a child, and stress recently  . Fibrocystic breast 2005  . LGSIL (low grade squamous intraepithelial lesion) on Pap smear 2007  . Libido, decreased 2010  . Migraines    migraines - otc med prn, then maxalt if needed  . Ovarian cyst   . 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 Tachycardia    per patient diagnosed in ER 2014, no problems currently  . Trichomonas   . Vaginosis 2008  . Vitamin D deficiency     Social History   Socioeconomic History  . Marital status: Married    Spouse name: Not on file  . Number of children: Not on file  . Years of education: Not on file  . Highest education level: Not on file  Social Needs  . Financial resource strain: Not on file  . Food insecurity - worry: Not on file  . Food insecurity - inability: Not on  file  . Transportation needs - medical: Not on file  . Transportation needs - non-medical: Not on file  Occupational History  . Not on file  Tobacco Use  . Smoking status: Former Smoker    Packs/day: 0.10    Years: 20.00    Pack years: 2.00    Start date: 02/28/2015  . Smokeless tobacco: Never Used  . Tobacco comment: patient uses nicotine gum. Smoked for over 20 years. Quit in 02/2015.  Substance and Sexual Activity  . Alcohol use: No    Alcohol/week: 0.0 oz  . Drug use: No  . Sexual activity: Yes    Birth control/protection: Surgical, Condom  Other Topics Concern  . Not on file  Social History Narrative   Is on disability    Married for 21 years    Three boys ( all three live locally)       She likes to hang out with friends. 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     Past Surgical History:  Procedure Laterality Date  . BREAST EXCISIONAL BIOPSY Left    benign  . BREAST SURGERY  1986   biopsy - benign  . CYSTOSCOPY  12/26/2012   Procedure: CYSTOSCOPY;  Surgeon: Ena Dawley, MD;  Location: [facility];  Service: Gynecology;;  .  DILATION AND CURETTAGE OF UTERUS    . ganglion cyst removed from wrist     left  . LAPAROSCOPIC ASSISTED VAGINAL HYSTERECTOMY N/A 12/26/2012   Procedure: LAPAROSCOPIC ASSISTED VAGINAL HYSTERECTOMY Uterine Morcellation, Bilateral Salpingectomy, ;  Surgeon: Ena Dawley, MD;  Location: [facility];  Service: Gynecology;  Laterality: N/A;  . MYOMECTOMY     In [place] - laparotomy  . OOPHORECTOMY  2000   laparotomy in [place]  . SHOULDER ARTHROSCOPY W/ ROTATOR CUFF REPAIR  2006   right  . TONSILLECTOMY AND ADENOIDECTOMY    . TUBAL LIGATION  2000  . WISDOM TOOTH EXTRACTION     x 2 teeth    Family History  Problem Relation Age of Onset  . Arthritis Mother   . Diabetes Mother   . Hypertension Mother   . Pulmonary embolism Mother        Cardiac arrest   . Heart disease Father   . Heart attack Father   .  Mesothelioma Father   . Hyperlipidemia Maternal Grandmother   . Breast cancer Maternal Grandmother   . Colitis Paternal Aunt        in 45's  . Heart attack Paternal Aunt   . Heart attack Paternal Uncle   . Stroke Paternal Aunt   . Breast cancer Maternal Aunt     No Known Allergies  Current Outpatient Medications on File Prior to Visit  Medication Sig Dispense Refill  . albuterol (PROVENTIL HFA;VENTOLIN HFA) 108 (90 Base) MCG/ACT inhaler Inhale 2 puffs into the lungs every 4 (four) hours as needed for wheezing. Dispense with aerochamber 1 Inhaler 0  . amitriptyline (ELAVIL) 25 MG tablet Take 1 tablet (25 mg total) by mouth at bedtime. 30 tablet 3  . Blood Glucose Monitoring Suppl ([place]) w/Device KIT Test once daily. Dx: E11.9 1 each 0  . Blood Glucose Monitoring Suppl (PRECISION XTRA MONITOR) DEVI 1 monitor 1 each 0  . cyclobenzaprine (FLEXERIL) 10 MG tablet Take 1 tablet (10 mg total) by mouth 2 (two) times daily as needed for muscle spasms. 20 tablet 0  . cyclobenzaprine (FLEXERIL) 10 MG tablet TAKE 1 TABLET BY MOUTH EVERY 8 HOURS AS NEEDED FOR MUSCLE SPASM 30 tablet 0  . fluticasone (FLOVENT HFA) 44 MCG/ACT inhaler Inhale 2 puffs into the lungs 2 (two) times daily. 1 Inhaler 12  . glipiZIDE (GLUCOTROL) 5 MG tablet Take 1 tablet (5 mg total) by mouth 2 (two) times daily before a meal. 60 tablet 3  . glucose blood (PRECISION XTRA TEST STRIPS) test strip Use as instructed once a day 100 each 1  . glucose blood test strip Use as instructed 100 each 12  . ibuprofen (ADVIL,MOTRIN) 800 MG tablet Take 1 tablet (800 mg total) by mouth every 8 (eight) hours as needed. 50 tablet 1  . Lancets (ACCU-CHEK MULTICLIX) lancets Use as instructed 100 each 12  . lisinopril (PRINIVIL,ZESTRIL) 5 MG tablet Take 1 tablet (5 mg total) by mouth daily. 90 tablet 3  . metFORMIN (GLUCOPHAGE-XR) 500 MG 24 hr tablet Take 1 tablet (500 mg total) by mouth daily with breakfast. 90 tablet 3  .  olopatadine (PATANOL) 0.1 % ophthalmic solution Place 1 drop into both eyes 2 (two) times daily.    Glory Rosebush DELICA LANCETS 85U MISC USE AS DIRECTED 100 each 1  . ONETOUCH DELICA LANCETS 31S MISC USE AS DIRECTED 100 each 0  . OVER THE COUNTER MEDICATION Apply 1 application topically as needed. Theragesic Cream.  Applying to neck & back.    Marland Kitchen oxyCODONE-acetaminophen (PERCOCET) 10-325 MG tablet Take 1 tablet by mouth every 8 (eight) hours as needed for pain. 60 tablet 0  . rizatriptan (MAXALT) 5 MG tablet Take 1 tablet (5 mg total) by mouth as needed. 10 tablet 3  . Tdap (BOOSTRIX) 5-2.5-18.5 LF-MCG/0.5 injection Boostrix Tdap 2.5 Lf unit-8 mcg-5 Lf/0.5 mL intramuscular suspension     No current facility-administered medications on file prior to visit.     BP 110/78 (BP Location: Left Arm, Patient Position: Sitting, Cuff Size: Large)   Pulse 96   Temp 97.7 F (36.5 C) (Oral)   Ht '5\' 7"'  (1.702 m)   Wt 207 lb 1.6 oz (93.9 kg)   LMP 12/14/2012   SpO2 98%   BMI 32.44 kg/m       Objective:   Physical Exam  Constitutional: She is oriented to person, place, and time. She appears well-developed and well-nourished. No distress.  Cardiovascular: Normal rate, regular rhythm, normal heart sounds and intact distal pulses. Exam reveals no gallop and no friction rub.  No murmur heard. Pulmonary/Chest: Effort normal and breath sounds normal. No respiratory distress. She has no wheezes. She has no rales. She exhibits no tenderness.  Musculoskeletal: Normal range of motion. She exhibits tenderness. She exhibits no edema or deformity.  Tenderness along left latissimus dorsi. Also with tenderness to left shoulder. No masses felt. No bruising noted.   Neurological: She is alert and oriented to person, place, and time. She has normal reflexes. She displays normal reflexes. No cranial nerve deficit. She exhibits normal muscle tone. Coordination normal.  Skin: Skin is warm and dry. She is not diaphoretic.    Psychiatric: She has a normal mood and affect. Her behavior is normal. Judgment and thought content normal.  Vitals reviewed.     Assessment & Plan:  1. Acute pain of left shoulder - DG Shoulder Left; Future  2. Left axillary pain - Appears muscular in nature.  - Advised stretching exercises and warm compress. Can continue motrin and muscle relaxer as needed - Follow up if not resolved in 1 week   3. Essential hypertension  - lisinopril (PRINIVIL,ZESTRIL) 2.5 MG tablet; Take 1 tablet (2.5 mg total) by mouth daily.  Dispense: 30 tablet; Refill: 1  [name], NP

## 2017-03-13 ENCOUNTER — Encounter: Payer: Self-pay | Admitting: Adult Health

## 2017-03-13 ENCOUNTER — Other Ambulatory Visit: Payer: Self-pay | Admitting: Adult Health

## 2017-03-13 DIAGNOSIS — R059 Cough, unspecified: Secondary | ICD-10-CM

## 2017-03-13 DIAGNOSIS — R05 Cough: Secondary | ICD-10-CM

## 2017-03-13 MED ORDER — BENZONATATE 200 MG PO CAPS: 200.0000 mg | ORAL_CAPSULE | Freq: Two times a day (BID) | ORAL | 1 refills | Status: DC | PRN

## 2017-03-20 ENCOUNTER — Encounter: Payer: PPO | Admitting: Adult Health

## 2017-03-29 ENCOUNTER — Ambulatory Visit (INDEPENDENT_AMBULATORY_CARE_PROVIDER_SITE_OTHER): Payer: PPO | Admitting: Adult Health

## 2017-03-29 ENCOUNTER — Other Ambulatory Visit: Payer: Self-pay | Admitting: Adult Health

## 2017-03-29 ENCOUNTER — Encounter: Payer: Self-pay | Admitting: Adult Health

## 2017-03-29 VITALS — BP 116/72 | Temp 97.7°F | Ht 68.0 in | Wt 207.0 lb

## 2017-03-29 DIAGNOSIS — E559 Vitamin D deficiency, unspecified: Secondary | ICD-10-CM | POA: Diagnosis not present

## 2017-03-29 DIAGNOSIS — R05 Cough: Secondary | ICD-10-CM

## 2017-03-29 DIAGNOSIS — E1165 Type 2 diabetes mellitus with hyperglycemia: Principal | ICD-10-CM

## 2017-03-29 DIAGNOSIS — T148XXA Other injury of unspecified body region, initial encounter: Secondary | ICD-10-CM

## 2017-03-29 DIAGNOSIS — Z760 Encounter for issue of repeat prescription: Secondary | ICD-10-CM | POA: Diagnosis not present

## 2017-03-29 DIAGNOSIS — I10 Essential (primary) hypertension: Secondary | ICD-10-CM | POA: Diagnosis not present

## 2017-03-29 DIAGNOSIS — Z23 Encounter for immunization: Secondary | ICD-10-CM

## 2017-03-29 DIAGNOSIS — G43809 Other migraine, not intractable, without status migrainosus: Secondary | ICD-10-CM | POA: Diagnosis not present

## 2017-03-29 DIAGNOSIS — IMO0001 Reserved for inherently not codable concepts without codable children: Secondary | ICD-10-CM

## 2017-03-29 DIAGNOSIS — Z0000 Encounter for general adult medical examination without abnormal findings: Secondary | ICD-10-CM

## 2017-03-29 DIAGNOSIS — R059 Cough, unspecified: Secondary | ICD-10-CM

## 2017-03-29 LAB — LIPID PANEL
CHOLESTEROL: 161 mg/dL (ref 0–200)
HDL: 46.2 mg/dL (ref >39.00)
LDL Cholesterol: 99 mg/dL (ref 0–99)
NONHDL: 114.31
Total CHOL/HDL Ratio: 3
Triglycerides: 77 mg/dL (ref 0.0–149.0)
VLDL: 15.4 mg/dL (ref 0.0–40.0)

## 2017-03-29 LAB — BASIC METABOLIC PANEL
BUN: 6 mg/dL (ref 6–23)
CHLORIDE: 103 meq/L (ref 96–112)
CO2: 28 mEq/L (ref 19–32)
Calcium: 9.4 mg/dL (ref 8.4–10.5)
Creatinine, Ser: 0.74 mg/dL (ref 0.40–1.20)
GFR: 105.8 mL/min (ref >60.00)
Glucose, Bld: 93 mg/dL (ref 70–99)
POTASSIUM: 4.1 meq/L (ref 3.5–5.1)
SODIUM: 139 meq/L (ref 135–145)

## 2017-03-29 LAB — HEPATIC FUNCTION PANEL
ALBUMIN: 4.1 g/dL (ref 3.5–5.2)
ALT: 11 U/L (ref 0–35)
AST: 12 U/L (ref 0–37)
Alkaline Phosphatase: 84 U/L (ref 39–117)
Bilirubin, Direct: 0.1 mg/dL (ref 0.0–0.3)
Total Bilirubin: 0.5 mg/dL (ref 0.2–1.2)
Total Protein: 6.8 g/dL (ref 6.0–8.3)

## 2017-03-29 LAB — CBC WITH DIFFERENTIAL/PLATELET
BASOS PCT: 1.3 % (ref 0.0–3.0)
Basophils Absolute: 0.1 10*3/uL (ref 0.0–0.1)
EOS PCT: 1 % (ref 0.0–5.0)
Eosinophils Absolute: 0.1 10*3/uL (ref 0.0–0.7)
HEMATOCRIT: 38.6 % (ref 36.0–46.0)
HEMOGLOBIN: 12.7 g/dL (ref 12.0–15.0)
LYMPHS PCT: 38 % (ref 12.0–46.0)
Lymphs Abs: 3.3 10*3/uL (ref 0.7–4.0)
MCHC: 33 g/dL (ref 30.0–36.0)
MCV: 85.5 fl (ref 78.0–100.0)
MONO ABS: 0.6 10*3/uL (ref 0.1–1.0)
MONOS PCT: 6.5 % (ref 3.0–12.0)
Neutro Abs: 4.6 10*3/uL (ref 1.4–7.7)
Neutrophils Relative %: 53.2 % (ref 43.0–77.0)
Platelets: 349 10*3/uL (ref 150.0–400.0)
RBC: 4.51 Mil/uL (ref 3.87–5.11)
RDW: 13.4 % (ref 11.5–15.5)
WBC: 8.7 10*3/uL (ref 4.0–10.5)

## 2017-03-29 LAB — VITAMIN D 25 HYDROXY (VIT D DEFICIENCY, FRACTURES): VITD: 22.74 ng/mL — AB (ref 30.00–100.00)

## 2017-03-29 LAB — HEMOGLOBIN A1C: Hgb A1c MFr Bld: 6.6 % — ABNORMAL HIGH (ref 4.6–6.5)

## 2017-03-29 LAB — TSH: TSH: 0.66 u[IU]/mL (ref 0.35–4.50)

## 2017-03-29 MED ORDER — RIZATRIPTAN BENZOATE 5 MG PO TABS: 5.0000 mg | ORAL_TABLET | ORAL | 3 refills | Status: DC | PRN

## 2017-03-29 MED ORDER — AMITRIPTYLINE HCL 25 MG PO TABS: 25.0000 mg | ORAL_TABLET | Freq: Every day | ORAL | 3 refills | Status: DC

## 2017-03-29 MED ORDER — METFORMIN HCL ER 500 MG PO TB24: 500.0000 mg | ORAL_TABLET | Freq: Every day | ORAL | 1 refills | Status: DC

## 2017-03-29 MED ORDER — METHYLPREDNISOLONE 4 MG PO TBPK: ORAL_TABLET | ORAL | 0 refills | Status: DC

## 2017-03-29 MED ORDER — ONETOUCH DELICA LANCETS 33G MISC: 0 refills | Status: DC

## 2017-03-29 MED ORDER — GLUCOSE BLOOD VI STRP: ORAL_STRIP | 12 refills | Status: DC

## 2017-03-29 MED ORDER — LISINOPRIL 2.5 MG PO TABS: 2.5000 mg | ORAL_TABLET | Freq: Every day | ORAL | 3 refills | Status: DC

## 2017-03-29 MED ORDER — FLUTICASONE PROPIONATE HFA 44 MCG/ACT IN AERO: 2.0000 | INHALATION_SPRAY | Freq: Two times a day (BID) | RESPIRATORY_TRACT | 12 refills | Status: DC

## 2017-03-29 NOTE — Progress Notes (Signed)
Subjective:    Patient ID: Karen Hardin, female    DOB: 08-Aug-1965, 52 y.o.   MRN: [id number]  Patient presents for annual physical.  She is needing refills on all her medications and glucometer strips.  She was recently seen in this office for left shoulder and back pain and treated with flexeril.  She was ordered and x-ray which she did not get.  She has continued to have left scapular and back pain.  She has been carrying a child in an infant carrier that is new for her.  She has tried heat and ice with the flexeril and has had some relief.  She also was recently given Tessalon perles for a cough and viral infection.  She is feeling better but still having some dry cough on occasion.  She is also experiencing more wheezing than usual.  She last used her Albuterol inhaler 2 days ago.  She is not using her fluticasone inhaler.    All immunizations and health maintenance protocols were reviewed with the patient and needed orders were placed. Would like her influenza vaccine.   Appropriate screening laboratory values were ordered for the patient including screening of hyperlipidemia, renal function and hepatic function.   Medication reconciliation,  past medical history, social history, problem list and allergies were reviewed in detail with the patient.  She is looking to go back to work at [employer] but has concerns about being on her feet for 8 hour shifts.  Goals were established with regard to weight loss, exercise, and  diet in compliance with medications.  She has cut out all soda over the past year and has dropped her weight from 222 to 208.  Encouraged her to continue with diet and increase exercise.          Review of Systems  Constitutional: Negative for activity change, appetite change, chills, fatigue, fever and unexpected weight change.  HENT: Negative.   Respiratory: Positive for cough, shortness of breath and wheezing. Negative for chest tightness.     Cardiovascular: Negative.   Gastrointestinal: Positive for constipation. Negative for abdominal distention, abdominal pain, diarrhea, nausea, rectal pain and vomiting.  Endocrine: Negative.   Genitourinary: Negative.   Musculoskeletal: Positive for back pain. Negative for joint swelling, myalgias and neck pain.  Neurological: Negative.   Psychiatric/Behavioral: Negative.    Past Medical History:  Diagnosis Date  . Anemia   . 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 Asthma 2002  . Depression    as a child, and stress recently  . Fibrocystic breast 2005  . LGSIL (low grade squamous intraepithelial lesion) on Pap smear 2007  . Libido, decreased 2010  . Migraines    migraines - otc med prn, then maxalt if needed  . Ovarian cyst   . 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 Tachycardia    per patient diagnosed in ER 2014, no problems currently  . Trichomonas   . Vaginosis 2008  . Vitamin D deficiency     Social History   Socioeconomic History  . Marital status: Married    Spouse name: Not on file  . Number of children: Not on file  . Years of education: Not on file  . Highest education level: Not on file  Social Needs  . Financial resource strain: Not on file  . Food insecurity - worry: Not on file  . Food insecurity - inability: Not  on file  . Transportation needs - medical: Not on file  . Transportation needs - non-medical: Not on file  Occupational History  . Not on file  Tobacco Use  . Smoking status: Former Smoker    Packs/day: 0.10    Years: 20.00    Pack years: 2.00    Start date: 02/28/2015  . Smokeless tobacco: Never Used  . Tobacco comment: patient uses nicotine gum. Smoked for over 20 years. Quit in 02/2015.  Substance and Sexual Activity  . Alcohol use: No    Alcohol/week: 0.0 oz  . Drug use: No  . Sexual activity: Yes    Birth control/protection: Surgical, Condom  Other Topics Concern  . Not on file  Social History  Narrative   Is on disability    Married for 21 years    Three boys ( all three live locally)       She likes to hang out with friends. 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     Past Surgical History:  Procedure Laterality Date  . BREAST EXCISIONAL BIOPSY Left    benign  . BREAST SURGERY  1986   biopsy - benign  . CYSTOSCOPY  12/26/2012   Procedure: CYSTOSCOPY;  Surgeon: Ena Dawley, MD;  Location: [facility];  Service: Gynecology;;  . DILATION AND CURETTAGE OF UTERUS    . ganglion cyst removed from wrist     left  . LAPAROSCOPIC ASSISTED VAGINAL HYSTERECTOMY N/A 12/26/2012   Procedure: LAPAROSCOPIC ASSISTED VAGINAL HYSTERECTOMY Uterine Morcellation, Bilateral Salpingectomy, ;  Surgeon: Ena Dawley, MD;  Location: [facility];  Service: Gynecology;  Laterality: N/A;  . MYOMECTOMY     In [place] - laparotomy  . OOPHORECTOMY  2000   laparotomy in [place]  . SHOULDER ARTHROSCOPY W/ ROTATOR CUFF REPAIR  2006   right  . TONSILLECTOMY AND ADENOIDECTOMY    . TUBAL LIGATION  2000  . WISDOM TOOTH EXTRACTION     x 2 teeth    Family History  Problem Relation Age of Onset  . Arthritis Mother   . Diabetes Mother   . Hypertension Mother   . Pulmonary embolism Mother        Cardiac arrest   . Heart disease Father   . Heart attack Father   . Mesothelioma Father   . Hyperlipidemia Maternal Grandmother   . Breast cancer Maternal Grandmother   . Colitis Paternal Aunt        in 25's  . Heart attack Paternal Aunt   . Heart attack Paternal Uncle   . Stroke Paternal Aunt   . Breast cancer Maternal Aunt     No Known Allergies  Current Outpatient Medications on File Prior to Visit  Medication Sig Dispense Refill  . albuterol (PROVENTIL HFA;VENTOLIN HFA) 108 (90 Base) MCG/ACT inhaler Inhale 2 puffs into the lungs every 4 (four) hours as needed for wheezing. Dispense with aerochamber 1 Inhaler 0  . Blood Glucose Monitoring Suppl ([place]) w/Device KIT Test once daily. Dx: E11.9 1 each 0  . cyclobenzaprine (FLEXERIL) 10 MG tablet TAKE 1 TABLET BY MOUTH EVERY 8 HOURS AS NEEDED FOR MUSCLE SPASM 30 tablet 0  . ibuprofen (ADVIL,MOTRIN) 800 MG tablet Take 1 tablet (800 mg total) by mouth every 8 (eight) hours as needed. 50 tablet 1  . metFORMIN (GLUCOPHAGE-XR) 500 MG 24 hr tablet Take 1 tablet (500 mg total) by mouth daily with breakfast. 90 tablet 3  .  ONETOUCH DELICA LANCETS 15P MISC USE AS DIRECTED 100 each 1  . OVER THE COUNTER MEDICATION Apply 1 application topically as needed. Theragesic Cream.  Applying to neck & back.    Marland Kitchen glipiZIDE (GLUCOTROL) 5 MG tablet Take 1 tablet (5 mg total) by mouth 2 (two) times daily before a meal. (Patient not taking: Reported on 03/29/2017) 60 tablet 3   No current facility-administered medications on file prior to visit.     BP 116/72 (BP Location: Left Arm)   Temp 97.7 F (36.5 C) (Oral)   Ht _0  (1.727 m)   Wt 207 lb (93.9 kg)   LMP 12/14/2012   BMI 31.47 kg/m      Objective:   Physical Exam  Constitutional: She is oriented to person, place, and time. She appears well-developed and well-nourished. No distress.  HENT:  Head: Normocephalic and atraumatic.  Right Ear: External ear normal.  Left Ear: External ear normal.  Nose: Mucosal edema present. No rhinorrhea.  Mouth/Throat: Oropharynx is clear and moist. No oropharyngeal exudate.  Inferior turbinates are red and erythematous without exudate.   Neck: Normal range of motion. Neck supple. No JVD present. No thyromegaly present.  Cardiovascular: Normal rate, regular rhythm, normal heart sounds and intact distal pulses. Exam reveals no gallop and no friction rub.  No murmur heard. Pulmonary/Chest: Effort normal and breath sounds normal. No stridor. No respiratory distress. She has no wheezes. She has no rales.  Abdominal: Soft. Bowel sounds are normal. She exhibits no distension and no mass. There is no tenderness. There is  no rebound and no guarding.  Genitourinary:  Genitourinary Comments: Patient declines.  She is seen yearly by Gyn.   Musculoskeletal: Normal range of motion. She exhibits no edema, tenderness or deformity.  Left sub-scapular and para spinous tenderness with palpation.  These muscles are very tight.    Lymphadenopathy:    She has no cervical adenopathy.  Neurological: She is alert and oriented to person, place, and time. She exhibits normal muscle tone.  Skin: Skin is warm and dry.  Psychiatric: She has a normal mood and affect. Her behavior is normal. Judgment and thought content normal.      Assessment & Plan:  1. Routine general medical examination at a health care facility Encourage diet and exercise.  Would like to get flu vaccine.  She is schedule 03/02 for eye exam.  Continue heat and cold for muscle pain in left scapular and back area.  Begin stretching exercises. Medications refilled. - Basic metabolic panel - CBC with Differential/Platelet - Hemoglobin A1c - Hepatic function panel - Lipid panel - TSH  2. Uncontrolled type 2 diabetes mellitus with hyperglycemia ([place]) Continue with diet and exercise.  Will check labs today and determine if medication changes need to be made.  - Basic metabolic panel - CBC with Differential/Platelet - Hemoglobin A1c - Hepatic function panel - Lipid panel - TSH - ONETOUCH DELICA LANCETS 79K MISC; USE AS DIRECTED  Dispense: 100 each; Refill: 0 - glucose blood test strip; Use as instructed  Dispense: 100 each; Refill: 12  3. Essential hypertension Blood pressure well controlled on current medication regimen.  - Basic metabolic panel - CBC with Differential/Platelet - Hemoglobin A1c - Hepatic function panel - Lipid panel - TSH - lisinopril (PRINIVIL,ZESTRIL) 2.5 MG tablet; Take 1 tablet (2.5 mg total) by mouth daily.  Dispense: 90 tablet; Refill: 3  4. Other migraine without status migrainosus, not intractable  - amitriptyline  (ELAVIL) 25 MG tablet; Take  1 tablet (25 mg total) by mouth at bedtime.  Dispense: 90 tablet; Refill: 3 - rizatriptan (MAXALT) 5 MG tablet; Take 1 tablet (5 mg total) by mouth as needed.  Dispense: 10 tablet; Refill: 3  5. Vitamin D deficiency Will check labs - Vitamin D, 25-hydroxy  6. Uncontrolled type 2 diabetes mellitus without complication, without long-term current use of insulin ([facility]) Continue diet and exercise. Will check labs and determine if changes need to be made to medication regimen.  - glucose blood test strip; Use as instructed  Dispense: 100 each; Refill: 12  7. Medication refill Refills for all medications provided.  - amitriptyline (ELAVIL) 25 MG tablet; Take 1 tablet (25 mg total) by mouth at bedtime.  Dispense: 90 tablet; Refill: 3 - fluticasone (FLOVENT HFA) 44 MCG/ACT inhaler; Inhale 2 puffs into the lungs 2 (two) times daily.  Dispense: 1 Inhaler; Refill: 12 - rizatriptan (MAXALT) 5 MG tablet; Take 1 tablet (5 mg total) by mouth as needed.  Dispense: 10 tablet; Refill: 3 - ONETOUCH DELICA LANCETS 50N MISC; USE AS DIRECTED  Dispense: 100 each; Refill: 0 - lisinopril (PRINIVIL,ZESTRIL) 2.5 MG tablet; Take 1 tablet (2.5 mg total) by mouth daily.  Dispense: 90 tablet; Refill: 3 - glucose blood test strip; Use as instructed  Dispense: 100 each; Refill: 12  8. Need for prophylactic vaccination and inoculation against influenza Flu vaccine provided - Flu Vaccine QUAD 6+ mos PF IM (Fluarix Quad PF)  Michale Weikel C Saydie Gerdts BSN RN  NP student

## 2017-03-29 NOTE — Progress Notes (Signed)
Subjective:    Patient ID: Karen Hardin, female    DOB: June 08, 1965, 52 y.o.   MRN: [id number]  HPI Patient presents for yearly preventative medicine examination. She is a pleasant 52 year old female who  has a past medical history of Anemia, Arthritis, Asthma (2002), Depression, Fibrocystic breast (2005), LGSIL (low grade squamous intraepithelial lesion) on Pap smear (2007), Libido, decreased (2010), Migraines, Ovarian cyst, Stargardt's disease (1986), Tachycardia, Trichomonas, Vaginosis (2008), and Vitamin D deficiency.  She takes Metformin XR 500 mg for history of diabetes mellitus. She reports that she has been working on diet and has cut out soda and is eating healthier. She reports one high blood sugar of greater than 250. Her blood sugar this morning was 108.  Lab Results  Component Value Date   HGBA1C 6.9 04/24/2016   She takes elavil 25 mg for migraine prophylaxis and Maxalt 5 mg PRN. - stable   She takes lisinopril for hypertension and for kidney protection from diabetes.   All immunizations and health maintenance protocols were reviewed with the patient and needed orders were placed. She would like her flu shot today.   Appropriate screening laboratory values were ordered for the patient including screening of hyperlipidemia, renal function and hepatic function.  Medication reconciliation,  past medical history, social history, problem list and allergies were reviewed in detail with the patient  Goals were established with regard to weight loss, exercise, and  diet in compliance with medications Wt Readings from Last 3 Encounters:  03/29/17 207 lb (93.9 kg)  02/28/17 207 lb 1.6 oz (93.9 kg)  04/24/16 222 lb (100.7 kg)   She is up to date on her mammogram. Her last colonoscopy was 2016. She has an eye exam scheduled.   She continues to complain of left lower back pain. She has been carrying a baby carrier on a purse on the side.  Also complains of dry cough that has  been residual from a viral infection she had in December.    Review of Systems  Constitutional: Negative.   HENT: Negative.   Eyes: Negative.   Respiratory: Negative.   Cardiovascular: Negative.   Gastrointestinal: Negative.   Endocrine: Negative.   Genitourinary: Negative.   Musculoskeletal: Positive for back pain. Negative for arthralgias and gait problem.  Skin: Negative.   Allergic/Immunologic: Negative.   Neurological: Negative.   Hematological: Negative.   Psychiatric/Behavioral: Negative.       Past Medical History:  Diagnosis Date  . Anemia   . 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 Asthma 2002  . Depression    as a child, and stress recently  . Fibrocystic breast 2005  . LGSIL (low grade squamous intraepithelial lesion) on Pap smear 2007  . Libido, decreased 2010  . Migraines    migraines - otc med prn, then maxalt if needed  . Ovarian cyst   . 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 Tachycardia    per patient diagnosed in ER 2014, no problems currently  . Trichomonas   . Vaginosis 2008  . Vitamin D deficiency     Social History   Socioeconomic History  . Marital status: Married    Spouse name: Not on file  . Number of children: Not on file  . Years of education: Not on file  . Highest education level: Not on file  Social Needs  . Financial resource strain: Not on file  . Food  insecurity - worry: Not on file  . Food insecurity - inability: Not on file  . Transportation needs - medical: Not on file  . Transportation needs - non-medical: Not on file  Occupational History  . Not on file  Tobacco Use  . Smoking status: Former Smoker    Packs/day: 0.10    Years: 20.00    Pack years: 2.00    Start date: 02/28/2015  . Smokeless tobacco: Never Used  . Tobacco comment: patient uses nicotine gum. Smoked for over 20 years. Quit in 02/2015.  Substance and Sexual Activity  . Alcohol use: No    Alcohol/week: 0.0 oz  . Drug  use: No  . Sexual activity: Yes    Birth control/protection: Surgical, Condom  Other Topics Concern  . Not on file  Social History Narrative   Is on disability    Married for 21 years    Three boys ( all three live locally)       She likes to hang out with friends. 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     Past Surgical History:  Procedure Laterality Date  . BREAST EXCISIONAL BIOPSY Left    benign  . BREAST SURGERY  1986   biopsy - benign  . CYSTOSCOPY  12/26/2012   Procedure: CYSTOSCOPY;  Surgeon: Ena Dawley, MD;  Location: [facility];  Service: Gynecology;;  . DILATION AND CURETTAGE OF UTERUS    . ganglion cyst removed from wrist     left  . LAPAROSCOPIC ASSISTED VAGINAL HYSTERECTOMY N/A 12/26/2012   Procedure: LAPAROSCOPIC ASSISTED VAGINAL HYSTERECTOMY Uterine Morcellation, Bilateral Salpingectomy, ;  Surgeon: Ena Dawley, MD;  Location: [facility];  Service: Gynecology;  Laterality: N/A;  . MYOMECTOMY     In [place] - laparotomy  . OOPHORECTOMY  2000   laparotomy in [place]  . SHOULDER ARTHROSCOPY W/ ROTATOR CUFF REPAIR  2006   right  . TONSILLECTOMY AND ADENOIDECTOMY    . TUBAL LIGATION  2000  . WISDOM TOOTH EXTRACTION     x 2 teeth    Family History  Problem Relation Age of Onset  . Arthritis Mother   . Diabetes Mother   . Hypertension Mother   . Pulmonary embolism Mother        Cardiac arrest   . Heart disease Father   . Heart attack Father   . Mesothelioma Father   . Hyperlipidemia Maternal Grandmother   . Breast cancer Maternal Grandmother   . Colitis Paternal Aunt        in 62's  . Heart attack Paternal Aunt   . Heart attack Paternal Uncle   . Stroke Paternal Aunt   . Breast cancer Maternal Aunt     No Known Allergies  Current Outpatient Medications on File Prior to Visit  Medication Sig Dispense Refill  . albuterol (PROVENTIL HFA;VENTOLIN HFA) 108 (90 Base) MCG/ACT inhaler Inhale 2 puffs into the lungs  every 4 (four) hours as needed for wheezing. Dispense with aerochamber 1 Inhaler 0  . Blood Glucose Monitoring Suppl ([place]) w/Device KIT Test once daily. Dx: E11.9 1 each 0  . cyclobenzaprine (FLEXERIL) 10 MG tablet TAKE 1 TABLET BY MOUTH EVERY 8 HOURS AS NEEDED FOR MUSCLE SPASM 30 tablet 0  . glucose blood test strip Use as instructed 100 each 12  . ibuprofen (ADVIL,MOTRIN) 800 MG tablet Take 1 tablet (800 mg total) by mouth every 8 (eight) hours as needed. 50 tablet  1  . lisinopril (PRINIVIL,ZESTRIL) 2.5 MG tablet Take 1 tablet (2.5 mg total) by mouth daily. 30 tablet 1  . metFORMIN (GLUCOPHAGE-XR) 500 MG 24 hr tablet Take 1 tablet (500 mg total) by mouth daily with breakfast. 90 tablet 3  . ONETOUCH DELICA LANCETS 59F MISC USE AS DIRECTED 100 each 1  . ONETOUCH DELICA LANCETS 63W MISC USE AS DIRECTED 100 each 0  . OVER THE COUNTER MEDICATION Apply 1 application topically as needed. Theragesic Cream.  Applying to neck & back.    . rizatriptan (MAXALT) 5 MG tablet Take 1 tablet (5 mg total) by mouth as needed. 10 tablet 3  . amitriptyline (ELAVIL) 25 MG tablet Take 1 tablet (25 mg total) by mouth at bedtime. (Patient not taking: Reported on 03/29/2017) 30 tablet 3  . fluticasone (FLOVENT HFA) 44 MCG/ACT inhaler Inhale 2 puffs into the lungs 2 (two) times daily. (Patient not taking: Reported on 03/29/2017) 1 Inhaler 12  . glipiZIDE (GLUCOTROL) 5 MG tablet Take 1 tablet (5 mg total) by mouth 2 (two) times daily before a meal. (Patient not taking: Reported on 03/29/2017) 60 tablet 3   No current facility-administered medications on file prior to visit.     BP 116/72 (BP Location: Left Arm)   Temp 97.7 F (36.5 C) (Oral)   Ht '5\' 8"'  (1.727 m)   Wt 207 lb (93.9 kg)   LMP 12/14/2012   BMI 31.47 kg/m       Objective:   Physical Exam  Constitutional: She is oriented to person, place, and time. She appears well-developed and well-nourished. No distress.  HENT:  Head:  Normocephalic and atraumatic.  Right Ear: External ear normal.  Left Ear: External ear normal.  Nose: Nose normal.  Mouth/Throat: Oropharynx is clear and moist. No oropharyngeal exudate.  Eyes: Conjunctivae and EOM are normal. Pupils are equal, round, and reactive to light. Right eye exhibits no discharge. Left eye exhibits no discharge. No scleral icterus.  Neck: Normal range of motion. Neck supple. No JVD present. No tracheal deviation present. No thyromegaly present.  Cardiovascular: Normal rate, regular rhythm, normal heart sounds and intact distal pulses. Exam reveals no gallop and no friction rub.  No murmur heard. Pulmonary/Chest: Effort normal and breath sounds normal. No stridor. No respiratory distress. She has no wheezes. She has no rales. She exhibits no tenderness.  Abdominal: Soft. Bowel sounds are normal. She exhibits no distension and no mass. There is no tenderness. There is no rebound and no guarding.  Musculoskeletal: Normal range of motion. She exhibits no edema, tenderness or deformity.  Pain with palpation along latissimus dorsi (L) No spinal tenderness  Lymphadenopathy:    She has no cervical adenopathy.  Neurological: She is alert and oriented to person, place, and time. She has normal reflexes. She displays normal reflexes. No cranial nerve deficit. She exhibits normal muscle tone. Coordination normal.  Skin: Skin is warm and dry. No rash noted. She is not diaphoretic. No erythema. No pallor.  Psychiatric: She has a normal mood and affect. Her behavior is normal. Judgment and thought content normal.  Nursing note and vitals reviewed.     Assessment & Plan:  1. Routine general medical examination at a health care facility - Has started smoking again. Encouraged to quit.  - Follow up in one year for CPE or sooner if needed - Basic metabolic panel - CBC with Differential/Platelet - Hemoglobin A1c - Hepatic function panel - Lipid panel - TSH  2. Uncontrolled type  2 diabetes mellitus with hyperglycemia ([place]) - - Consider increasing Metformin or add glipizide  - Follow up in 3-6 months  - Continue with improving diet and exercising. She has lost 14 pounds over the last year  - Basic metabolic panel - CBC with Differential/Platelet - Hemoglobin A1c - Hepatic function panel - Lipid panel - TSH - glucose blood test strip; Use as instructed  Dispense: 100 each; Refill: 12  3. Essential hypertension - Well controlled. No change in medication  - Basic metabolic panel - CBC with Differential/Platelet - Hemoglobin A1c - Hepatic function panel - Lipid panel - TSH - lisinopril (PRINIVIL,ZESTRIL) 2.5 MG tablet; Take 1 tablet (2.5 mg total) by mouth daily.  Dispense: 90 tablet; Refill: 3  4. Other migraine without status migrainosus, not intractable  - amitriptyline (ELAVIL) 25 MG tablet; Take 1 tablet (25 mg total) by mouth at bedtime.  Dispense: 90 tablet; Refill: 3 - rizatriptan (MAXALT) 5 MG tablet; Take 1 tablet (5 mg total) by mouth as needed.  Dispense: 10 tablet; Refill: 3  5. Vitamin D deficiency  - Vitamin D, 25-hydroxy  6. Medication refill  - amitriptyline (ELAVIL) 25 MG tablet; Take 1 tablet (25 mg total) by mouth at bedtime.  Dispense: 90 tablet; Refill: 3 - fluticasone (FLOVENT HFA) 44 MCG/ACT inhaler; Inhale 2 puffs into the lungs 2 (two) times daily.  Dispense: 1 Inhaler; Refill: 12 - rizatriptan (MAXALT) 5 MG tablet; Take 1 tablet (5 mg total) by mouth as needed.  Dispense: 10 tablet; Refill: 3 - lisinopril (PRINIVIL,ZESTRIL) 2.5 MG tablet; Take 1 tablet (2.5 mg total) by mouth daily.  Dispense: 90 tablet; Refill: 3 - glucose blood test strip; Use as instructed  Dispense: 100 each; Refill: 12  7. Need for prophylactic vaccination and inoculation against influenza  - Flu Vaccine QUAD 6+ mos PF IM (Fluarix Quad PF)  8. Cough  - methylPREDNISolone (MEDROL DOSEPAK) 4 MG TBPK tablet; Take as directed  Dispense: 21 tablet; Refill:  0  9. Muscle strain - Stretching exercises. Exercise band given  - Drink more water than normal when taking prednisone to help keep blood sugars down  - methylPREDNISolone (MEDROL DOSEPAK) 4 MG TBPK tablet; Take as directed  Dispense: 21 tablet; Refill: 0  [name], NP

## 2017-04-09 ENCOUNTER — Encounter: Payer: PPO | Admitting: Adult Health

## 2017-05-01 LAB — HM DIABETES EYE EXAM

## 2017-05-03 ENCOUNTER — Encounter: Payer: Self-pay | Admitting: Family Medicine

## 2017-05-15 DIAGNOSIS — M5412 Radiculopathy, cervical region: Secondary | ICD-10-CM | POA: Diagnosis not present

## 2017-05-15 DIAGNOSIS — M5020 Other cervical disc displacement, unspecified cervical region: Secondary | ICD-10-CM | POA: Diagnosis not present

## 2017-07-30 ENCOUNTER — Encounter: Payer: Self-pay | Admitting: Adult Health

## 2017-07-30 ENCOUNTER — Ambulatory Visit (INDEPENDENT_AMBULATORY_CARE_PROVIDER_SITE_OTHER): Payer: PPO | Admitting: Adult Health

## 2017-07-30 VITALS — BP 120/74 | Temp 98.2°F | Wt 203.0 lb

## 2017-07-30 DIAGNOSIS — E1165 Type 2 diabetes mellitus with hyperglycemia: Secondary | ICD-10-CM | POA: Diagnosis not present

## 2017-07-30 DIAGNOSIS — T148XXA Other injury of unspecified body region, initial encounter: Secondary | ICD-10-CM

## 2017-07-30 DIAGNOSIS — IMO0001 Reserved for inherently not codable concepts without codable children: Secondary | ICD-10-CM

## 2017-07-30 LAB — POCT GLYCOSYLATED HEMOGLOBIN (HGB A1C): HEMOGLOBIN A1C: 6.2 % — AB (ref 4.0–5.6)

## 2017-07-30 NOTE — Progress Notes (Signed)
Subjective:    Patient ID: Karen Hardin, female    DOB: 25-Nov-1965, 52 y.o.   MRN: [id number]  HPI  52 year old female who  has a past medical history of Anemia, Arthritis, Asthma (2002), Depression, Fibrocystic breast (2005), LGSIL (low grade squamous intraepithelial lesion) on Pap smear (2007), Libido, decreased (2010), Migraines, Ovarian cyst, Stargardt's disease (1986), Tachycardia, Trichomonas, Vaginosis (2008), and Vitamin D deficiency.  She presents to the office today for follow up regarding left shoulder and back pain. I last saw her for this issue back in Jan and Feb 2019.  Flexeril was prescribed and she reports taking this until the bottle was finished.  She did not find much relief.  An x-ray was also ordered which she has not went and got as of today.  She continues to have a "soreness" discomfort to left shoulder, neck, scapula, and left flank.  She reports that her new job does have her doing a lot of upper body movements that she feels as though some of the soreness may be coming from that.  She has no issues with range of motion.  Additionally, she is in the office today for her every 3 month A1c check.  She reports that her diet has been somewhat more poor and it was previously when she started drinking sodas again.  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  Currently prescribed metformin 500 mg extended release daily.  Her last A1c was 6.6 in February  Review of Systems See HPI   Past Medical History:  Diagnosis Date  . Anemia   . 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 Asthma 2002  . Depression    as a child, and stress recently  . Fibrocystic breast 2005  . LGSIL (low grade squamous intraepithelial lesion) on Pap smear 2007  . Libido, decreased 2010  . Migraines    migraines - otc med prn, then maxalt if needed  . Ovarian cyst   . 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  Tachycardia    per patient diagnosed in ER 2014, no problems currently  . Trichomonas   . Vaginosis 2008  . Vitamin D deficiency     Social History   Socioeconomic History  . Marital status: Married    Spouse name: Not on file  . Number of children: Not on file  . Years of education: Not on file  . Highest education level: Not on file  Occupational History  . Not on file  Social Needs  . Financial resource strain: Not on file  . Food insecurity:    Worry: Not on file    Inability: Not on file  . Transportation needs:    Medical: Not on file    Non-medical: Not on file  Tobacco Use  . Smoking status: Former Smoker    Packs/day: 0.10    Years: 20.00    Pack years: 2.00    Start date: 02/28/2015  . Smokeless tobacco: Never Used  . Tobacco comment: patient uses nicotine gum. Smoked for over 20 years. Quit in 02/2015.  Substance and Sexual Activity  . Alcohol use: No    Alcohol/week: 0.0 oz  . Drug use: No  . Sexual activity: Yes    Birth control/protection: Surgical, Condom  Lifestyle  . Physical activity:    Days per week: Not on file  Minutes per session: Not on file  . Stress: Not on file  Relationships  . Social connections:    Talks on phone: Not on file    Gets together: Not on file    Attends religious service: Not on file    Active member of club or organization: Not on file    Attends meetings of clubs or organizations: Not on file    Relationship status: Not on file  . Intimate partner violence:    Fear of current or ex partner: Not on file    Emotionally abused: Not on file    Physically abused: Not on file    Forced sexual activity: Not on file  Other Topics Concern  . Not on file  Social History Narrative   Is on disability    Married for 21 years    Three boys ( all three live locally)       She likes to hang out with friends. 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     Past Surgical History:  Procedure  Laterality Date  . BREAST EXCISIONAL BIOPSY Left    benign  . BREAST SURGERY  1986   biopsy - benign  . CYSTOSCOPY  12/26/2012   Procedure: CYSTOSCOPY;  Surgeon: Ena Dawley, MD;  Location: [facility];  Service: Gynecology;;  . DILATION AND CURETTAGE OF UTERUS    . ganglion cyst removed from wrist     left  . LAPAROSCOPIC ASSISTED VAGINAL HYSTERECTOMY N/A 12/26/2012   Procedure: LAPAROSCOPIC ASSISTED VAGINAL HYSTERECTOMY Uterine Morcellation, Bilateral Salpingectomy, ;  Surgeon: Ena Dawley, MD;  Location: [facility];  Service: Gynecology;  Laterality: N/A;  . MYOMECTOMY     In [place] - laparotomy  . OOPHORECTOMY  2000   laparotomy in [place]  . SHOULDER ARTHROSCOPY W/ ROTATOR CUFF REPAIR  2006   right  . TONSILLECTOMY AND ADENOIDECTOMY    . TUBAL LIGATION  2000  . WISDOM TOOTH EXTRACTION     x 2 teeth    Family History  Problem Relation Age of Onset  . Arthritis Mother   . Diabetes Mother   . Hypertension Mother   . Pulmonary embolism Mother        Cardiac arrest   . Heart disease Father   . Heart attack Father   . Mesothelioma Father   . Hyperlipidemia Maternal Grandmother   . Breast cancer Maternal Grandmother   . Colitis Paternal Aunt        in 21's  . Heart attack Paternal Aunt   . Heart attack Paternal Uncle   . Stroke Paternal Aunt   . Breast cancer Maternal Aunt     No Known Allergies  Current Outpatient Medications on File Prior to Visit  Medication Sig Dispense Refill  . albuterol (PROVENTIL HFA;VENTOLIN HFA) 108 (90 Base) MCG/ACT inhaler Inhale 2 puffs into the lungs every 4 (four) hours as needed for wheezing. Dispense with aerochamber 1 Inhaler 0  . Blood Glucose Monitoring Suppl ([place]) w/Device KIT Test once daily. Dx: E11.9 1 each 0  . cyclobenzaprine (FLEXERIL) 10 MG tablet TAKE 1 TABLET BY MOUTH EVERY 8 HOURS AS NEEDED FOR MUSCLE SPASM 30 tablet 0  . glucose blood test strip Use as instructed 100 each 12  . ibuprofen  (ADVIL,MOTRIN) 800 MG tablet Take 1 tablet (800 mg total) by mouth every 8 (eight) hours as needed. 50 tablet 1  . lisinopril (PRINIVIL,ZESTRIL) 2.5 MG tablet Take  1 tablet (2.5 mg total) by mouth daily. 90 tablet 3  . metFORMIN (GLUCOPHAGE-XR) 500 MG 24 hr tablet Take 1 tablet (500 mg total) by mouth daily with breakfast. 90 tablet 1  . ONETOUCH DELICA LANCETS 18D MISC USE AS DIRECTED 100 each 1  . OVER THE COUNTER MEDICATION Apply 1 application topically as needed. Theragesic Cream.  Applying to neck & back.    . rizatriptan (MAXALT) 5 MG tablet Take 1 tablet (5 mg total) by mouth as needed. 10 tablet 3  . amitriptyline (ELAVIL) 25 MG tablet Take 1 tablet (25 mg total) by mouth at bedtime. (Patient not taking: Reported on 07/30/2017) 90 tablet 3   No current facility-administered medications on file prior to visit.     BP 120/74   Temp 98.2 F (36.8 C) (Oral)   Wt 203 lb (92.1 kg)   LMP 12/14/2012   BMI 30.87 kg/m       Objective:   Physical Exam  Constitutional: She is oriented to person, place, and time. She appears well-developed and well-nourished. No distress.  Cardiovascular: Normal rate, regular rhythm, normal heart sounds and intact distal pulses. Exam reveals no gallop and no friction rub.  No murmur heard. Pulmonary/Chest: Effort normal and breath sounds normal. No stridor. No respiratory distress. She has no wheezes. She has no rales. She exhibits no tenderness.  Musculoskeletal: Normal range of motion. She exhibits tenderness (Tenderness with palpation along sided trapezius and latissimus dorsi). She exhibits no edema or deformity.  Neurological: She is alert and oriented to person, place, and time.  Skin: Skin is warm and dry. She is not diaphoretic.  Psychiatric: She has a normal mood and affect. Her behavior is normal. Judgment and thought content normal.  Nursing note and vitals reviewed.     Assessment & Plan:  1. Uncontrolled type 2 diabetes mellitus without  complication, without long-term current use of insulin (HCC)  - POCT A1C- 6.2 -Educated on the importance of diabetic diet and frequent aerobic exercise  2. Muscle strain -Exam 10 use to appear as muscle strain.  Probably worse from more intense physical activity.  Will refer to physical therapy for possible dry needling - Ambulatory referral to Physical Therapy  [name], NP

## 2017-08-12 ENCOUNTER — Telehealth: Payer: Self-pay

## 2017-08-12 ENCOUNTER — Ambulatory Visit: Payer: Self-pay

## 2017-08-12 ENCOUNTER — Other Ambulatory Visit: Payer: Self-pay | Admitting: Family Medicine

## 2017-08-12 DIAGNOSIS — M79644 Pain in right finger(s): Secondary | ICD-10-CM

## 2017-08-12 NOTE — Telephone Encounter (Signed)
Needs ROV    To decide what would be helpful   Or can wait until CN returns for advise    ( she was referred to PT and can ask if she is  receiving services)

## 2017-08-12 NOTE — Telephone Encounter (Signed)
Copied from [place] [id number]. Topic: Quick Communication - See Telephone Encounter >> Aug 12, 2017  1:27 PM Percell Belt A wrote: CRM for notification. See Telephone encounter for: 08/12/17.  Pt called and stated she has been back and front here in the office the last few weeks into the office.  She would like to know if cory would put in a order for a chest xray.  She is still hurting though her shoulder blades and would like to have a xray done.     Best number [phone]  >> Dr. Regis Bill is covering [place] today.

## 2017-08-14 NOTE — Telephone Encounter (Signed)
I left a voice message for pt to return our call.

## 2017-08-20 ENCOUNTER — Other Ambulatory Visit: Payer: Self-pay | Admitting: Adult Health

## 2017-08-20 DIAGNOSIS — M546 Pain in thoracic spine: Secondary | ICD-10-CM

## 2017-08-20 NOTE — Telephone Encounter (Signed)
Xray placed for cervical and thoracic spine which will give [place] better pictures than a chest xray

## 2017-08-20 NOTE — Telephone Encounter (Signed)
Left a message for a return call.

## 2017-08-20 NOTE — Telephone Encounter (Signed)
Pt notified of results/instructions and verbalized understanding.

## 2017-08-22 ENCOUNTER — Ambulatory Visit (INDEPENDENT_AMBULATORY_CARE_PROVIDER_SITE_OTHER)
Admission: RE | Admit: 2017-08-22 | Discharge: 2017-08-22 | Disposition: A | Discharge status: Home / Self Care | Payer: PPO | Source: Ambulatory Visit | Attending: Adult Health | Admitting: Adult Health

## 2017-08-22 DIAGNOSIS — M47814 Spondylosis without myelopathy or radiculopathy, thoracic region: Secondary | ICD-10-CM | POA: Diagnosis not present

## 2017-08-22 DIAGNOSIS — M47812 Spondylosis without myelopathy or radiculopathy, cervical region: Secondary | ICD-10-CM | POA: Diagnosis not present

## 2017-08-22 DIAGNOSIS — M546 Pain in thoracic spine: Secondary | ICD-10-CM | POA: Diagnosis not present

## 2017-08-23 ENCOUNTER — Other Ambulatory Visit: Payer: Self-pay | Admitting: Family Medicine

## 2017-08-23 DIAGNOSIS — M549 Dorsalgia, unspecified: Principal | ICD-10-CM

## 2017-08-23 DIAGNOSIS — G8929 Other chronic pain: Secondary | ICD-10-CM

## 2017-08-23 NOTE — Progress Notes (Signed)
phy

## 2017-08-24 ENCOUNTER — Other Ambulatory Visit: Payer: Self-pay | Admitting: Adult Health

## 2017-10-30 ENCOUNTER — Other Ambulatory Visit: Payer: Self-pay | Admitting: Obstetrics and Gynecology

## 2017-10-30 DIAGNOSIS — Z1231 Encounter for screening mammogram for malignant neoplasm of breast: Secondary | ICD-10-CM

## 2017-11-08 DIAGNOSIS — Z6830 Body mass index (BMI) 30.0-30.9, adult: Secondary | ICD-10-CM | POA: Diagnosis not present

## 2017-11-08 DIAGNOSIS — Z01411 Encounter for gynecological examination (general) (routine) with abnormal findings: Secondary | ICD-10-CM | POA: Diagnosis not present

## 2017-11-11 ENCOUNTER — Other Ambulatory Visit: Payer: Self-pay | Admitting: Obstetrics and Gynecology

## 2017-11-11 DIAGNOSIS — N6320 Unspecified lump in the left breast, unspecified quadrant: Secondary | ICD-10-CM

## 2017-11-15 ENCOUNTER — Ambulatory Visit
Admission: RE | Admit: 2017-11-15 | Discharge: 2017-11-15 | Disposition: A | Discharge status: Home / Self Care | Payer: PPO | Source: Ambulatory Visit | Attending: Obstetrics and Gynecology | Admitting: Obstetrics and Gynecology

## 2017-11-15 ENCOUNTER — Ambulatory Visit: Payer: Self-pay

## 2017-11-15 DIAGNOSIS — R922 Inconclusive mammogram: Secondary | ICD-10-CM | POA: Diagnosis not present

## 2017-11-15 DIAGNOSIS — N6320 Unspecified lump in the left breast, unspecified quadrant: Secondary | ICD-10-CM

## 2018-02-18 ENCOUNTER — Encounter: Payer: Self-pay | Admitting: Adult Health

## 2018-02-18 ENCOUNTER — Ambulatory Visit (INDEPENDENT_AMBULATORY_CARE_PROVIDER_SITE_OTHER): Payer: PPO | Admitting: Adult Health

## 2018-02-18 VITALS — BP 124/80 | Temp 97.8°F | Wt 209.0 lb

## 2018-02-18 DIAGNOSIS — E1165 Type 2 diabetes mellitus with hyperglycemia: Secondary | ICD-10-CM

## 2018-02-18 DIAGNOSIS — E559 Vitamin D deficiency, unspecified: Secondary | ICD-10-CM | POA: Diagnosis not present

## 2018-02-18 DIAGNOSIS — M791 Myalgia, unspecified site: Secondary | ICD-10-CM | POA: Diagnosis not present

## 2018-02-18 DIAGNOSIS — IMO0001 Reserved for inherently not codable concepts without codable children: Secondary | ICD-10-CM

## 2018-02-18 NOTE — Progress Notes (Signed)
Subjective:    Patient ID: Karen Hardin, female    DOB: Jul 22, 1965, 53 y.o.   MRN: [id number]  HPI  53 year old female who  has a past medical history of Anemia, Arthritis, Asthma (2002), Depression, Fibrocystic breast (2005), LGSIL (low grade squamous intraepithelial lesion) on Pap smear (2007), Libido, decreased (2010), Migraines, Ovarian cyst, Stargardt's disease (1986), Tachycardia, Trichomonas, Vaginosis (2008), and Vitamin D deficiency.  She presents to the office today for follow up regarding DM and neck/back/and bilateral shoulder pain  DM -  She was last seen six months ago and her A1c was 6.2. She is currently managed with Metformin 500 mg XR daily. She has been monitoring her blood sugars and has been getting readings consistently below 120. She had a few readings over the holidays that were in the 247 range. She denies any GI issues while taking metformin.   Lab Results  Component Value Date   HGBA1C 6.2 (A) 07/30/2017    Neck/Back/Shoulder Pain -she was last seen for this issue in June 2019 but has been present as far back as January 2018.  Reports "soreness and stiffness" in cervical and lumbar spine as well as bilateral shoulders.  We have tried Flexeril, Motrin, ice, heat, and exercises which have not provided much relief.  X-rays have shown degenerative changes in cervical and thoracic spine.  Notices more discomfort with activities and in the morning, but feels as though the discomfort is becoming worse.  Wt Readings from Last 3 Encounters:  02/18/18 209 lb (94.8 kg)  07/30/17 203 lb (92.1 kg)  03/29/17 207 lb (93.9 kg)    Review of Systems See HPI   Past Medical History:  Diagnosis Date  . Anemia   . 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 Asthma 2002  . Depression    as a child, and stress recently  . Fibrocystic breast 2005  . LGSIL (low grade squamous intraepithelial lesion) on Pap smear 2007  . Libido, decreased 2010  . Migraines      migraines - otc med prn, then maxalt if needed  . Ovarian cyst   . 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 Tachycardia    per patient diagnosed in ER 2014, no problems currently  . Trichomonas   . Vaginosis 2008  . Vitamin D deficiency     Social History   Socioeconomic History  . Marital status: Married    Spouse name: Not on file  . Number of children: Not on file  . Years of education: Not on file  . Highest education level: Not on file  Occupational History  . Not on file  Social Needs  . Financial resource strain: Not on file  . Food insecurity:    Worry: Not on file    Inability: Not on file  . Transportation needs:    Medical: Not on file    Non-medical: Not on file  Tobacco Use  . Smoking status: Former Smoker    Packs/day: 0.10    Years: 20.00    Pack years: 2.00    Start date: 02/28/2015  . Smokeless tobacco: Never Used  . Tobacco comment: patient uses nicotine gum. Smoked for over 20 years. Quit in 02/2015.  Substance and Sexual Activity  . Alcohol use: No    Alcohol/week: 0.0 standard drinks  . Drug use: No  . Sexual activity: Yes    Birth control/protection: Surgical, Condom  Lifestyle  . Physical activity:    Days per week: Not on file    Minutes per session: Not on file  . Stress: Not on file  Relationships  . Social connections:    Talks on phone: Not on file    Gets together: Not on file    Attends religious service: Not on file    Active member of club or organization: Not on file    Attends meetings of clubs or organizations: Not on file    Relationship status: Not on file  . Intimate partner violence:    Fear of current or ex partner: Not on file    Emotionally abused: Not on file    Physically abused: Not on file    Forced sexual activity: Not on file  Other Topics Concern  . Not on file  Social History Narrative   Is on disability    Married for 21 years    Three boys ( all three live locally)       She likes to  hang out with friends. 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     Past Surgical History:  Procedure Laterality Date  . BREAST EXCISIONAL BIOPSY Left    benign  . BREAST SURGERY  1986   biopsy - benign  . CYSTOSCOPY  12/26/2012   Procedure: CYSTOSCOPY;  Surgeon: Ena Dawley, MD;  Location: [facility];  Service: Gynecology;;  . DILATION AND CURETTAGE OF UTERUS    . ganglion cyst removed from wrist     left  . LAPAROSCOPIC ASSISTED VAGINAL HYSTERECTOMY N/A 12/26/2012   Procedure: LAPAROSCOPIC ASSISTED VAGINAL HYSTERECTOMY Uterine Morcellation, Bilateral Salpingectomy, ;  Surgeon: Ena Dawley, MD;  Location: [facility];  Service: Gynecology;  Laterality: N/A;  . MYOMECTOMY     In [place] - laparotomy  . OOPHORECTOMY  2000   laparotomy in [place]  . SHOULDER ARTHROSCOPY W/ ROTATOR CUFF REPAIR  2006   right  . TONSILLECTOMY AND ADENOIDECTOMY    . TUBAL LIGATION  2000  . WISDOM TOOTH EXTRACTION     x 2 teeth    Family History  Problem Relation Age of Onset  . Arthritis Mother   . Diabetes Mother   . Hypertension Mother   . Pulmonary embolism Mother        Cardiac arrest   . Heart disease Father   . Heart attack Father   . Mesothelioma Father   . Hyperlipidemia Maternal Grandmother   . Breast cancer Maternal Grandmother   . Colitis Paternal Aunt        in 77's  . Heart attack Paternal Aunt   . Heart attack Paternal Uncle   . Stroke Paternal Aunt   . Breast cancer Maternal Aunt     No Known Allergies  Current Outpatient Medications on File Prior to Visit  Medication Sig Dispense Refill  . albuterol (PROVENTIL HFA;VENTOLIN HFA) 108 (90 Base) MCG/ACT inhaler Inhale 2 puffs into the lungs every 4 (four) hours as needed for wheezing. Dispense with aerochamber 1 Inhaler 0  . amitriptyline (ELAVIL) 25 MG tablet Take 1 tablet (25 mg total) by mouth at bedtime. 90 tablet 3  . Blood Glucose Monitoring Suppl ([place]) w/Device  KIT Test once daily. Dx: E11.9 1 each 0  . cyclobenzaprine (FLEXERIL) 10 MG tablet TAKE 1 TABLET BY MOUTH EVERY 8 HOURS AS NEEDED FOR MUSCLE SPASM 30 tablet 0  . glucose blood test strip Use  as instructed 100 each 12  . ibuprofen (ADVIL,MOTRIN) 800 MG tablet Take 1 tablet (800 mg total) by mouth every 8 (eight) hours as needed. 50 tablet 1  . lisinopril (PRINIVIL,ZESTRIL) 2.5 MG tablet Take 1 tablet (2.5 mg total) by mouth daily. 90 tablet 3  . metFORMIN (GLUCOPHAGE-XR) 500 MG 24 hr tablet Take 1 tablet (500 mg total) by mouth daily with breakfast. 90 tablet 1  . ONETOUCH DELICA LANCETS 76P MISC USE AS DIRECTED 100 each 1  . OVER THE COUNTER MEDICATION Apply 1 application topically as needed. Theragesic Cream.  Applying to neck & back.    . rizatriptan (MAXALT) 5 MG tablet Take 1 tablet (5 mg total) by mouth as needed. 10 tablet 3   No current facility-administered medications on file prior to visit.     BP 124/80   Temp 97.8 F (36.6 C)   Wt 209 lb (94.8 kg)   LMP 12/14/2012   BMI 31.78 kg/m       Objective:   Physical Exam Vitals signs and nursing note reviewed.  Constitutional:      Appearance: Normal appearance. She is obese.  Cardiovascular:     Rate and Rhythm: Normal rate and regular rhythm.     Pulses: Normal pulses.     Heart sounds: Normal heart sounds.  Pulmonary:     Effort: Pulmonary effort is normal.     Breath sounds: Normal breath sounds.  Musculoskeletal:        General: Tenderness present.     Comments: He has diffuse muscular tenderness throughout bilateral shoulders, scapula, and neck. No acute bony tenderness noted  Skin:    General: Skin is warm and dry.  Neurological:     General: No focal deficit present.     Mental Status: She is alert and oriented to person, place, and time. Mental status is at baseline.  Psychiatric:        Mood and Affect: Mood normal.        Behavior: Behavior normal.        Thought Content: Thought content normal.         Judgment: Judgment normal.       Assessment & Plan:  1. Myalgia I doubt mild arthritis is causing her pain.  Discomfort seems to be more muscular in nature.  Due to duration this issuesNeed to rule out inflammatory conditions, rheumatic disease such as polymyalgia rheumatica, and autoimmune disease.  Cannot rule out noninflammatory conditions such as fibromyalgia or chronic fatigue syndrome.  She is not on a statin at this time. -Consider referral to rheumatology - Sedimentation Rate - C-reactive Protein - ANA - Rheumatoid Factor - CBC with Differential/Platelet - Iron, TIBC and Ferritin Panel - Vitamin D, 25-hydroxy - Vitamin B12 - TSH  2. Uncontrolled type 2 diabetes mellitus without complication, without long-term current use of insulin (HCC)  - POC HgB A1c- 6.0.  - Has improved.  - No change in medication therapy.   3. Vitamin D deficiency  - Vitamin D, 25-hydroxy  [name], NP

## 2018-02-19 LAB — CBC WITH DIFFERENTIAL/PLATELET
BASOS ABS: 0.2 10*3/uL — AB (ref 0.0–0.1)
Basophils Relative: 2 % (ref 0.0–3.0)
EOS ABS: 0.1 10*3/uL (ref 0.0–0.7)
Eosinophils Relative: 1.3 % (ref 0.0–5.0)
HEMATOCRIT: 40.9 % (ref 36.0–46.0)
HEMOGLOBIN: 13.3 g/dL (ref 12.0–15.0)
LYMPHS PCT: 44.4 % (ref 12.0–46.0)
Lymphs Abs: 3.6 10*3/uL (ref 0.7–4.0)
MCHC: 32.6 g/dL (ref 30.0–36.0)
MCV: 86.4 fl (ref 78.0–100.0)
MONO ABS: 0.5 10*3/uL (ref 0.1–1.0)
Monocytes Relative: 5.6 % (ref 3.0–12.0)
Neutro Abs: 3.8 10*3/uL (ref 1.4–7.7)
Neutrophils Relative %: 46.7 % (ref 43.0–77.0)
Platelets: 296 10*3/uL (ref 150.0–400.0)
RBC: 4.73 Mil/uL (ref 3.87–5.11)
RDW: 13.7 % (ref 11.5–15.5)
WBC: 8.1 10*3/uL (ref 4.0–10.5)

## 2018-02-19 LAB — VITAMIN B12: Vitamin B-12: 188 pg/mL — ABNORMAL LOW (ref 211–911)

## 2018-02-19 LAB — VITAMIN D 25 HYDROXY (VIT D DEFICIENCY, FRACTURES): VITD: 22.04 ng/mL — ABNORMAL LOW (ref 30.00–100.00)

## 2018-02-19 LAB — TSH: TSH: 0.91 u[IU]/mL (ref 0.35–4.50)

## 2018-02-19 LAB — SEDIMENTATION RATE: SED RATE: 32 mm/h — AB (ref 0–30)

## 2018-02-19 LAB — C-REACTIVE PROTEIN: CRP: 0.9 mg/dL (ref 0.5–20.0)

## 2018-02-20 ENCOUNTER — Telehealth: Payer: Self-pay | Admitting: Adult Health

## 2018-02-20 LAB — POCT GLYCOSYLATED HEMOGLOBIN (HGB A1C): HbA1c, POC (controlled diabetic range): 6 % (ref 0.0–7.0)

## 2018-02-20 LAB — ANA: ANA: NEGATIVE

## 2018-02-20 LAB — IRON,TIBC AND FERRITIN PANEL
%SAT: 11 % (calc) — ABNORMAL LOW (ref 16–45)
FERRITIN: 35 ng/mL (ref 16–232)
Iron: 37 ug/dL — ABNORMAL LOW (ref 45–160)
TIBC: 339 mcg/dL (calc) (ref 250–450)

## 2018-02-20 LAB — RHEUMATOID FACTOR: Rhuematoid fact SerPl-aCnc: <14 IU/mL (ref <14)

## 2018-02-20 MED ORDER — VITAMIN D (ERGOCALCIFEROL) 1.25 MG (50000 UNIT) PO CAPS: 50000.0000 [IU] | ORAL_CAPSULE | ORAL | 6 refills | Status: DC

## 2018-02-20 NOTE — Telephone Encounter (Signed)
Spoke to the patient and informed her of her labs.   I am going to have her start an oral vitamin b12 supplement and iron supplement   Will send in Vitamin D 50,000 units weekly   Reassess in 1 month

## 2018-02-28 ENCOUNTER — Other Ambulatory Visit: Payer: Self-pay | Admitting: Adult Health

## 2018-02-28 DIAGNOSIS — IMO0001 Reserved for inherently not codable concepts without codable children: Secondary | ICD-10-CM

## 2018-02-28 DIAGNOSIS — E1165 Type 2 diabetes mellitus with hyperglycemia: Principal | ICD-10-CM

## 2018-02-28 NOTE — Telephone Encounter (Signed)
Sent to the pharmacy by e-scribe. 

## 2018-05-06 ENCOUNTER — Other Ambulatory Visit: Payer: Self-pay

## 2018-05-06 ENCOUNTER — Other Ambulatory Visit: Payer: Self-pay | Admitting: Adult Health

## 2018-05-06 ENCOUNTER — Encounter: Payer: Self-pay | Admitting: Adult Health

## 2018-05-06 ENCOUNTER — Ambulatory Visit (INDEPENDENT_AMBULATORY_CARE_PROVIDER_SITE_OTHER): Payer: PPO | Admitting: Adult Health

## 2018-05-06 DIAGNOSIS — Z760 Encounter for issue of repeat prescription: Secondary | ICD-10-CM

## 2018-05-06 DIAGNOSIS — M791 Myalgia, unspecified site: Secondary | ICD-10-CM

## 2018-05-06 DIAGNOSIS — G43809 Other migraine, not intractable, without status migrainosus: Secondary | ICD-10-CM

## 2018-05-06 NOTE — Progress Notes (Signed)
Virtual Visit via Telephone Note  I connected with Karen Hardin on 05/06/18 at 2:50 pm by telephone and verified that I am speaking with the correct person using two identifiers.   I discussed the limitations, risks, security and privacy concerns of performing an evaluation and management service by telephone and the availability of in person appointments. I also discussed with the patient that there may be a patient responsible charge related to this service. The patient expressed understanding and agreed to proceed.  Location patient: home Location provider: work or home office Participants present for the call: patient, provider Patient did not have a visit in the prior 7 days to address this/these issue(s).   History of Present Illness: This is a 53 year old female who is evaluated today for follow-up regarding myalgia.  She was last seen for this issue back in January 2020, and June 2019 prior to that.  She continues to report "soreness and stiffness" in cervical and lumbar spine as well as bilateral shoulders.  In the past we have tried Flexeril, Motrin, ice, heat, and exercises which have not provided much relief.  She had x-rays done which showed mild degenerative changes in cervical cervical and thoracic spine.  In January labs showed a very mildly elevated sedimentation rate, low vitamin D and B12 levels, as well as some iron deficiency anemia.  Rheumatoid factor,ANA and C-reactive protein were all negative  She was started on vitamin D 50,000 units weekly, over-the-counter B12 supplementation as well as an over-the-counter iron supplement.  They she reports that she continues to have myalgias and since starting the supplements she has not noticed much improvement in any of her symptoms.  She continues to report more discomfort with active with activities in the morning.   Observations/Objective: Patient sounds cheerful and well on the phone. I do not appreciate any  SOB. Speech and thought processing are grossly intact.  Assessment and Plan: Cannot rule out noninflammatory conditions such as fibromyalgia or chronic fatigue syndrome.  He has been on amitriptyline 25 mg nightly in the past and reports having some leftover medication.  I am going to have her restart this in a week and follow-up with me via my chart and would also like to refer her to rheumatology further evaluation and myalgias. Follow Up Instructions: See Above   I did not refer this patient for an OV in the next 24 hours for this/these issue(s).  I discussed the assessment and treatment plan with the patient. The patient was provided an opportunity to ask questions and all were answered. The patient agreed with the plan and demonstrated an understanding of the instructions.   The patient was advised to call back or seek an in-person evaluation if the symptoms worsen or if the condition fails to improve as anticipated.  I provided 26 minutes of non-face-to-face time during this encounter.   [name], NP

## 2018-05-07 ENCOUNTER — Encounter: Payer: PPO | Admitting: Adult Health

## 2018-06-22 ENCOUNTER — Other Ambulatory Visit: Payer: Self-pay | Admitting: Adult Health

## 2018-06-22 DIAGNOSIS — IMO0001 Reserved for inherently not codable concepts without codable children: Secondary | ICD-10-CM

## 2018-06-23 NOTE — Telephone Encounter (Signed)
Sent to the pharmacy by e-scribe for 90 days.  Pt has upcoming cpx on 08/06/2018.

## 2018-06-24 ENCOUNTER — Other Ambulatory Visit: Payer: PPO

## 2018-07-08 ENCOUNTER — Other Ambulatory Visit: Payer: Self-pay | Admitting: Adult Health

## 2018-07-29 NOTE — Progress Notes (Deleted)
Office Visit Note  Patient: Karen Hardin             Date of Birth: 06-25-1965           MRN: [id number]             PCP: [name], NP Referring: [name], NP Visit Date: 08/07/2018 Occupation: @GUAROCC @  Subjective:  No chief complaint on file.   History of Present Illness: Karen Hardin is a 53 y.o. female ***   Activities of Daily Living:  Patient reports morning stiffness for *** {minute/hour:19697}.   Patient {ACTIONS;DENIES/REPORTS:21021675::"Denies"} nocturnal pain.  Difficulty dressing/grooming: {ACTIONS;DENIES/REPORTS:21021675::"Denies"} Difficulty climbing stairs: {ACTIONS;DENIES/REPORTS:21021675::"Denies"} Difficulty getting out of chair: {ACTIONS;DENIES/REPORTS:21021675::"Denies"} Difficulty using hands for taps, buttons, cutlery, and/or writing: {ACTIONS;DENIES/REPORTS:21021675::"Denies"}  No Rheumatology ROS completed.   PMFS History:  Patient Active Problem List   Diagnosis Date Noted  . Essential hypertension 03/06/2016  . Diabetes type 2, uncontrolled ([place]) 09/22/2015  . Asthma, chronic 05/11/2014  . Legal blindness 05/11/2014  . Chronic back pain 05/11/2014  . Migraines 05/11/2014  . Stargardt's disease   . Menorrhagia 09/11/2011    Past Medical History:  Diagnosis Date  . Anemia   . 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 Asthma 2002  . Depression    as a child, and stress recently  . Fibrocystic breast 2005  . LGSIL (low grade squamous intraepithelial lesion) on Pap smear 2007  . Libido, decreased 2010  . Migraines    migraines - otc med prn, then maxalt if needed  . Ovarian cyst   . 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 Tachycardia    per patient diagnosed in ER 2014, no problems currently  . Trichomonas   . Vaginosis 2008  . Vitamin D deficiency     Family History  Problem Relation Age of Onset  . Arthritis Mother   . Diabetes Mother   . Hypertension Mother   . Pulmonary  embolism Mother        Cardiac arrest   . Heart disease Father   . Heart attack Father   . Mesothelioma Father   . Hyperlipidemia Maternal Grandmother   . Breast cancer Maternal Grandmother   . Colitis Paternal Aunt        in 62's  . Heart attack Paternal Aunt   . Heart attack Paternal Uncle   . Stroke Paternal Aunt   . Breast cancer Maternal Aunt    Past Surgical History:  Procedure Laterality Date  . BREAST EXCISIONAL BIOPSY Left    benign  . BREAST SURGERY  1986   biopsy - benign  . CYSTOSCOPY  12/26/2012   Procedure: CYSTOSCOPY;  Surgeon: Ena Dawley, MD;  Location: [facility];  Service: Gynecology;;  . DILATION AND CURETTAGE OF UTERUS    . ganglion cyst removed from wrist     left  . LAPAROSCOPIC ASSISTED VAGINAL HYSTERECTOMY N/A 12/26/2012   Procedure: LAPAROSCOPIC ASSISTED VAGINAL HYSTERECTOMY Uterine Morcellation, Bilateral Salpingectomy, ;  Surgeon: Ena Dawley, MD;  Location: [facility];  Service: Gynecology;  Laterality: N/A;  . MYOMECTOMY     In [place] - laparotomy  . OOPHORECTOMY  2000   laparotomy in [place]  . SHOULDER ARTHROSCOPY W/ ROTATOR CUFF REPAIR  2006   right  . TONSILLECTOMY AND ADENOIDECTOMY    . TUBAL LIGATION  2000  . WISDOM TOOTH EXTRACTION     x 2 teeth   Social History  Social History Narrative   Is on disability    Married for 21 years    Three boys ( all three live locally)       She likes to hang out with friends. 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    Immunization History  Administered Date(s) Administered  . Influenza,inj,Quad PF,6+ Mos 12/27/2012, 03/08/2015, 01/24/2016, 03/29/2017  . Pneumococcal Polysaccharide-23 12/27/2012  . Tdap 01/24/2016     Objective: Vital Signs: LMP 12/14/2012    Physical Exam   Musculoskeletal Exam: ***  CDAI Exam: CDAI Score: - Patient Global: -; Provider Global: - Swollen: -; Tender: - Joint Exam   No joint exam has been documented for this  visit   There is currently no information documented on the homunculus. Go to the Rheumatology activity and complete the homunculus joint exam.  Investigation: Findings:  02/18/18: Sed rate 32, CRP 0.9, ANA negative, RF <14, iron 37, TIBC 339, ferritin 35, vitamin D 22, Vitamin B12 188, TSH 0.91  Component     Latest Ref Rng & Units 02/18/2018  Sed Rate     0 - 30 mm/hr 32 (H)  CRP     0.5 - 20.0 mg/dL 0.9  Anti Nuclear Antibody (ANA)     NEGATIVE NEGATIVE  RA Latex Turbid.     <14 IU/mL <14   Component     Latest Ref Rng & Units 02/18/2018  Iron     45 - 160 mcg/dL 37 (L)  TIBC     250 - 450 mcg/dL (calc) 339  %SAT     16 - 45 % (calc) 11 (L)  Ferritin     16 - 232 ng/mL 35  VITD     30.00 - 100.00 ng/mL 22.04 (L)  Vitamin B12     211 - 911 pg/mL 188 (L)  TSH     0.35 - 4.50 uIU/mL 0.91   Imaging: No results found.  Recent Labs: Lab Results  Component Value Date   WBC 8.1 02/18/2018   HGB 13.3 02/18/2018   PLT 296.0 02/18/2018   NA 139 03/29/2017   K 4.1 03/29/2017   CL 103 03/29/2017   CO2 28 03/29/2017   GLUCOSE 93 03/29/2017   BUN 6 03/29/2017   CREATININE 0.74 03/29/2017   BILITOT 0.5 03/29/2017   ALKPHOS 84 03/29/2017   AST 12 03/29/2017   ALT 11 03/29/2017   PROT 6.8 03/29/2017   ALBUMIN 4.1 03/29/2017   CALCIUM 9.4 03/29/2017   GFRAA >90 12/19/2012    Speciality Comments: No specialty comments available.  Procedures:  No procedures performed Allergies: Patient has no known allergies.   Assessment / Plan:     Visit Diagnoses: No diagnosis found.   Orders: No orders of the defined types were placed in this encounter.  No orders of the defined types were placed in this encounter.   Face-to-face time spent with patient was *** minutes. Greater than 50% of time was spent in counseling and coordination of care.  Follow-Up Instructions: No follow-ups on file.   Ofilia Neas, PA-C  Note - This record has been created using Dragon  software.  Chart creation errors have been sought, but may not always  have been located. Such creation errors do not reflect on  the standard of medical care.

## 2018-08-01 ENCOUNTER — Telehealth: Payer: Self-pay | Admitting: Adult Health

## 2018-08-01 ENCOUNTER — Other Ambulatory Visit: Payer: Self-pay | Admitting: Adult Health

## 2018-08-01 DIAGNOSIS — G43809 Other migraine, not intractable, without status migrainosus: Secondary | ICD-10-CM

## 2018-08-01 DIAGNOSIS — Z760 Encounter for issue of repeat prescription: Secondary | ICD-10-CM

## 2018-08-01 NOTE — Telephone Encounter (Signed)
Medication Refill - Medication: metFORMIN (GLUCOPHAGE-XR) 500 MG 24 hr tablet  Pt states she has stopped taking this medication due to nationwide recall.  Please call pt to advise re: replacement drug,   Preferred Pharmacy :  [facility] [address], [place] - [address] [phone] (Phone) [phone] (Fax)     Agent: Please be advised that RX refills may take up to 3 business days. We ask that you follow-up with your pharmacy.

## 2018-08-06 ENCOUNTER — Encounter: Payer: Self-pay | Admitting: Family Medicine

## 2018-08-06 ENCOUNTER — Other Ambulatory Visit: Payer: Self-pay

## 2018-08-06 ENCOUNTER — Encounter: Payer: PPO | Admitting: Adult Health

## 2018-08-06 ENCOUNTER — Ambulatory Visit (INDEPENDENT_AMBULATORY_CARE_PROVIDER_SITE_OTHER): Payer: PPO | Admitting: Adult Health

## 2018-08-06 ENCOUNTER — Encounter: Payer: Self-pay | Admitting: Adult Health

## 2018-08-06 VITALS — BP 116/76 | Temp 97.6°F | Ht 67.25 in | Wt 212.0 lb

## 2018-08-06 DIAGNOSIS — I10 Essential (primary) hypertension: Secondary | ICD-10-CM | POA: Diagnosis not present

## 2018-08-06 DIAGNOSIS — Z0000 Encounter for general adult medical examination without abnormal findings: Secondary | ICD-10-CM | POA: Diagnosis not present

## 2018-08-06 DIAGNOSIS — E559 Vitamin D deficiency, unspecified: Secondary | ICD-10-CM | POA: Diagnosis not present

## 2018-08-06 DIAGNOSIS — Z760 Encounter for issue of repeat prescription: Secondary | ICD-10-CM | POA: Diagnosis not present

## 2018-08-06 DIAGNOSIS — E538 Deficiency of other specified B group vitamins: Secondary | ICD-10-CM | POA: Diagnosis not present

## 2018-08-06 DIAGNOSIS — G43809 Other migraine, not intractable, without status migrainosus: Secondary | ICD-10-CM | POA: Diagnosis not present

## 2018-08-06 DIAGNOSIS — D509 Iron deficiency anemia, unspecified: Secondary | ICD-10-CM

## 2018-08-06 DIAGNOSIS — E1165 Type 2 diabetes mellitus with hyperglycemia: Secondary | ICD-10-CM

## 2018-08-06 LAB — CBC WITH DIFFERENTIAL/PLATELET
Basophils Absolute: 0 10*3/uL (ref 0.0–0.1)
Basophils Relative: 0.4 % (ref 0.0–3.0)
Eosinophils Absolute: 0.1 10*3/uL (ref 0.0–0.7)
Eosinophils Relative: 1.1 % (ref 0.0–5.0)
HCT: 38.3 % (ref 36.0–46.0)
Hemoglobin: 12.5 g/dL (ref 12.0–15.0)
Lymphocytes Relative: 34 % (ref 12.0–46.0)
Lymphs Abs: 2.8 10*3/uL (ref 0.7–4.0)
MCHC: 32.6 g/dL (ref 30.0–36.0)
MCV: 87.1 fl (ref 78.0–100.0)
Monocytes Absolute: 0.5 10*3/uL (ref 0.1–1.0)
Monocytes Relative: 6.3 % (ref 3.0–12.0)
Neutro Abs: 4.8 10*3/uL (ref 1.4–7.7)
Neutrophils Relative %: 58.2 % (ref 43.0–77.0)
Platelets: 296 10*3/uL (ref 150.0–400.0)
RBC: 4.4 Mil/uL (ref 3.87–5.11)
RDW: 13.6 % (ref 11.5–15.5)
WBC: 8.3 10*3/uL (ref 4.0–10.5)

## 2018-08-06 LAB — IBC + FERRITIN
Ferritin: 37.7 ng/mL (ref 10.0–291.0)
Iron: 59 ug/dL (ref 42–145)
Saturation Ratios: 16.2 % — ABNORMAL LOW (ref 20.0–50.0)
Transferrin: 260 mg/dL (ref 212.0–360.0)

## 2018-08-06 LAB — COMPREHENSIVE METABOLIC PANEL
ALT: 12 U/L (ref 0–35)
AST: 13 U/L (ref 0–37)
Albumin: 4.2 g/dL (ref 3.5–5.2)
Alkaline Phosphatase: 80 U/L (ref 39–117)
BUN: 7 mg/dL (ref 6–23)
CO2: 27 mEq/L (ref 19–32)
Calcium: 9.5 mg/dL (ref 8.4–10.5)
Chloride: 107 mEq/L (ref 96–112)
Creatinine, Ser: 0.68 mg/dL (ref 0.40–1.20)
GFR: 109.18 mL/min (ref >60.00)
Glucose, Bld: 97 mg/dL (ref 70–99)
Potassium: 4.1 mEq/L (ref 3.5–5.1)
Sodium: 142 mEq/L (ref 135–145)
Total Bilirubin: 0.5 mg/dL (ref 0.2–1.2)
Total Protein: 6.8 g/dL (ref 6.0–8.3)

## 2018-08-06 LAB — LIPID PANEL
Cholesterol: 156 mg/dL (ref 0–200)
HDL: 51.8 mg/dL (ref >39.00)
LDL Cholesterol: 89 mg/dL (ref 0–99)
NonHDL: 104.49
Total CHOL/HDL Ratio: 3
Triglycerides: 78 mg/dL (ref 0.0–149.0)
VLDL: 15.6 mg/dL (ref 0.0–40.0)

## 2018-08-06 LAB — TSH: TSH: 0.69 u[IU]/mL (ref 0.35–4.50)

## 2018-08-06 LAB — VITAMIN B12: Vitamin B-12: 398 pg/mL (ref 211–911)

## 2018-08-06 LAB — VITAMIN D 25 HYDROXY (VIT D DEFICIENCY, FRACTURES): VITD: 48.03 ng/mL (ref 30.00–100.00)

## 2018-08-06 LAB — MAGNESIUM: Magnesium: 1.8 mg/dL (ref 1.5–2.5)

## 2018-08-06 LAB — HEMOGLOBIN A1C: Hgb A1c MFr Bld: 6.5 % (ref 4.6–6.5)

## 2018-08-06 MED ORDER — RIZATRIPTAN BENZOATE 5 MG PO TABS: 5.0000 mg | ORAL_TABLET | ORAL | 3 refills | Status: DC | PRN

## 2018-08-06 MED ORDER — LISINOPRIL 2.5 MG PO TABS: 2.5000 mg | ORAL_TABLET | Freq: Every day | ORAL | 3 refills | Status: DC

## 2018-08-06 MED ORDER — GLIPIZIDE ER 5 MG PO TB24: 5.0000 mg | ORAL_TABLET | Freq: Every day | ORAL | 0 refills | Status: DC

## 2018-08-06 NOTE — Patient Instructions (Signed)
It was great seeing you today   I have changed your diabetes medication - I have sent in Glipzide 5 mg XR. Lets follow up in 3 months to see how your blood sugars are doing  Work on weight loss through diet and exercise  Drink more water  We will follow up with you regarding your blood work

## 2018-08-06 NOTE — Telephone Encounter (Signed)
Filled earlier today

## 2018-08-06 NOTE — Progress Notes (Signed)
Subjective:    Patient ID: Karen Hardin, female    DOB: 1965-06-22, 53 y.o.   MRN: [id number]  HPI Patient presents for yearly preventative medicine examination. She is a pleasant 53 year old female who  has a past medical history of Anemia, Arthritis, Asthma (2002), Depression, Fibrocystic breast (2005), LGSIL (low grade squamous intraepithelial lesion) on Pap smear (2007), Libido, decreased (2010), Migraines, Ovarian cyst, Stargardt's disease (1986), Tachycardia, Trichomonas, Vaginosis (2008), and Vitamin D deficiency.   DM II -past she has been managed with metformin 500 mg extended release daily.  She stopped this medication a week ago due to [place] recall.  Has been monitoring her blood sugars at home and since she stopped the medication the highest blood sugar she has had is been 220.  She did not have any GI issues with extended release metformin but did have GI issues with non-extended release metformin.  She takes lisinopril 2.5 mg daily for kidney protection  Migraines -had been headache free for multiple months but more recently she started having tubal migraines on a weekly basis.  She has been able to use Tylenol and Excedrin which helped the pain but she is out of Maxalt.  The past she was placed on Elavil 25 mg but did not have any relief with this medication  Vitamin D Deficiency - Takes 50,000 units weekly  Vitamin B 12 deficiency - takes 1000 mcg daily   Iron Deficiency Anemia -taking over-the-counter iron supplement as January.  She does take this with a vitamin C tablet.  She does report some constipation  Myalgia - she is being seen by Rheumatology tomorrow  All immunizations and health maintenance protocols were reviewed with the patient and needed orders were placed. UTD  Appropriate screening laboratory values were ordered for the patient including screening of hyperlipidemia, renal function and hepatic function.  Medication reconciliation,  past medical  history, social history, problem list and allergies were reviewed in detail with the patient  Goals were established with regard to weight loss, exercise, and  diet in compliance with medications.  He has not been exercising on a routine basis nor is she eating a heart healthy diet.  End of life planning was discussed.  Wt Readings from Last 3 Encounters:  08/06/18 212 lb (96.2 kg)  02/18/18 209 lb (94.8 kg)  07/30/17 203 lb (92.1 kg)   She participates in routine dental and vision exams. She was seeing GYN her GYN retired and she would like referral to them somebody new.  Is up-to-date on routine screening mammogram   Review of Systems  Constitutional: Negative.   HENT: Negative.   Eyes: Negative.   Respiratory: Negative.   Cardiovascular: Negative.   Gastrointestinal: Positive for constipation.  Endocrine: Negative.   Genitourinary: Negative.   Musculoskeletal: Positive for back pain and myalgias.  Allergic/Immunologic: Negative.   Neurological: Negative.   Hematological: Negative.   Psychiatric/Behavioral: Negative.   All other systems reviewed and are negative.  Past Medical History:  Diagnosis Date  . Anemia   . 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 Asthma 2002  . Depression    as a child, and stress recently  . Fibrocystic breast 2005  . LGSIL (low grade squamous intraepithelial lesion) on Pap smear 2007  . Libido, decreased 2010  . Migraines    migraines - otc med prn, then maxalt if needed  . Ovarian cyst   . Stargardt's disease 1986  Patient is legally blind  . Tachycardia    per patient diagnosed in ER 2014, no problems currently  . Trichomonas   . Vaginosis 2008  . Vitamin D deficiency     Social History   Socioeconomic History  . Marital status: Married    Spouse name: Not on file  . Number of children: Not on file  . Years of education: Not on file  . Highest education level: Not on file  Occupational History  . Not on file   Social Needs  . Financial resource strain: Not on file  . Food insecurity    Worry: Not on file    Inability: Not on file  . Transportation needs    Medical: Not on file    Non-medical: Not on file  Tobacco Use  . Smoking status: Former Smoker    Packs/day: 0.10    Years: 20.00    Pack years: 2.00    Start date: 02/28/2015  . Smokeless tobacco: Never Used  . Tobacco comment: patient uses nicotine gum. Smoked for over 20 years. Quit in 02/2015.  Substance and Sexual Activity  . Alcohol use: No    Alcohol/week: 0.0 standard drinks  . Drug use: No  . Sexual activity: Yes    Birth control/protection: Surgical, Condom  Lifestyle  . Physical activity    Days per week: Not on file    Minutes per session: Not on file  . Stress: Not on file  Relationships  . Social Herbalist on phone: Not on file    Gets together: Not on file    Attends religious service: Not on file    Active member of club or organization: Not on file    Attends meetings of clubs or organizations: Not on file    Relationship status: Not on file  . Intimate partner violence    Fear of current or ex partner: Not on file    Emotionally abused: Not on file    Physically abused: Not on file    Forced sexual activity: Not on file  Other Topics Concern  . Not on file  Social History Narrative   Is on disability    Married for 21 years    Three boys ( all three live locally)       She likes to hang out with friends. 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     Past Surgical History:  Procedure Laterality Date  . BREAST EXCISIONAL BIOPSY Left    benign  . BREAST SURGERY  1986   biopsy - benign  . CYSTOSCOPY  12/26/2012   Procedure: CYSTOSCOPY;  Surgeon: Ena Dawley, MD;  Location: [facility];  Service: Gynecology;;  . DILATION AND CURETTAGE OF UTERUS    . ganglion cyst removed from wrist     left  . LAPAROSCOPIC ASSISTED VAGINAL HYSTERECTOMY N/A 12/26/2012    Procedure: LAPAROSCOPIC ASSISTED VAGINAL HYSTERECTOMY Uterine Morcellation, Bilateral Salpingectomy, ;  Surgeon: Ena Dawley, MD;  Location: [facility];  Service: Gynecology;  Laterality: N/A;  . MYOMECTOMY     In [place] - laparotomy  . OOPHORECTOMY  2000   laparotomy in [place]  . SHOULDER ARTHROSCOPY W/ ROTATOR CUFF REPAIR  2006   right  . TONSILLECTOMY AND ADENOIDECTOMY    . TUBAL LIGATION  2000  . WISDOM TOOTH EXTRACTION     x 2 teeth    Family History  Problem Relation Age of Onset  .  Arthritis Mother   . Diabetes Mother   . Hypertension Mother   . Pulmonary embolism Mother        Cardiac arrest   . Heart disease Father   . Heart attack Father   . Mesothelioma Father   . Hyperlipidemia Maternal Grandmother   . Breast cancer Maternal Grandmother   . Colitis Paternal Aunt        in 87's  . Heart attack Paternal Aunt   . Heart attack Paternal Uncle   . Stroke Paternal Aunt   . Breast cancer Maternal Aunt     No Known Allergies  Current Outpatient Medications on File Prior to Visit  Medication Sig Dispense Refill  . albuterol (PROVENTIL HFA;VENTOLIN HFA) 108 (90 Base) MCG/ACT inhaler Inhale 2 puffs into the lungs every 4 (four) hours as needed for wheezing. Dispense with aerochamber 1 Inhaler 0  . amitriptyline (ELAVIL) 25 MG tablet TAKE 1 TABLET BY MOUTH AT BEDTIME 30 tablet 0  . Ascorbic Acid (VITAMIN C PO) Take by mouth.    . Blood Glucose Monitoring Suppl (Tropic) w/Device KIT Test once daily. Dx: E11.9 1 each 0  . cyclobenzaprine (FLEXERIL) 10 MG tablet TAKE 1 TABLET BY MOUTH EVERY 8 HOURS AS NEEDED FOR MUSCLE SPASM 30 tablet 0  . Ferrous Sulfate (IRON PO) Take by mouth.    Marland Kitchen glucose blood test strip Use as instructed 100 each 12  . lisinopril (PRINIVIL,ZESTRIL) 2.5 MG tablet Take 1 tablet (2.5 mg total) by mouth daily. 90 tablet 3  . ONETOUCH DELICA LANCETS 70J MISC USE AS DIRECTED 100 each 1  . OVER THE COUNTER MEDICATION Apply 1  application topically as needed. Theragesic Cream.  Applying to neck & back.    . vitamin B-12 (CYANOCOBALAMIN) 1000 MCG tablet Take 1,000 mcg by mouth daily.    . Vitamin D, Ergocalciferol, (DRISDOL) 1.25 MG (50000 UT) CAPS capsule Take 1 capsule (50,000 Units total) by mouth every 7 (seven) days. 5 capsule 6   No current facility-administered medications on file prior to visit.     BP 116/76   Temp 97.6 F (36.4 C)   Ht 5' 7.25" (1.708 m)   Wt 212 lb (96.2 kg)   LMP 12/14/2012   BMI 32.96 kg/m       Objective:   Physical Exam Vitals signs and nursing note reviewed.  Constitutional:      Appearance: She is obese.  HENT:     Head: Normocephalic and atraumatic.     Right Ear: Tympanic membrane, ear canal and external ear normal. There is no impacted cerumen.     Left Ear: Tympanic membrane, ear canal and external ear normal. There is no impacted cerumen.     Nose: Nose normal. No congestion or rhinorrhea.     Mouth/Throat:     Mouth: Mucous membranes are moist.     Pharynx: Oropharynx is clear. No oropharyngeal exudate or posterior oropharyngeal erythema.  Eyes:     General: No scleral icterus.       Right eye: No discharge.        Left eye: No discharge.     Extraocular Movements: Extraocular movements intact.     Conjunctiva/sclera: Conjunctivae normal.     Pupils: Pupils are equal, round, and reactive to light.  Neck:     Musculoskeletal: Normal range of motion.  Cardiovascular:     Rate and Rhythm: Normal rate and regular rhythm.     Pulses: Normal pulses.  Heart sounds: Normal heart sounds. No murmur. No friction rub. No gallop.   Pulmonary:     Effort: Pulmonary effort is normal. No respiratory distress.     Breath sounds: Normal breath sounds. No stridor. No wheezing, rhonchi or rales.  Chest:     Chest wall: No tenderness.  Abdominal:     General: Abdomen is flat. Bowel sounds are normal. There is no distension.     Palpations: Abdomen is soft. There is no  mass.     Tenderness: There is no abdominal tenderness. There is no right CVA tenderness, left CVA tenderness, guarding or rebound.     Hernia: No hernia is present.  Musculoskeletal: Normal range of motion.        General: No swelling, tenderness, deformity or signs of injury.     Right lower leg: No edema.     Left lower leg: No edema.  Skin:    General: Skin is warm and dry.     Capillary Refill: Capillary refill takes less than 2 seconds.     Coloration: Skin is not jaundiced or pale.     Findings: No bruising, erythema, lesion or rash.  Neurological:     General: No focal deficit present.     Mental Status: She is alert and oriented to person, place, and time.     Cranial Nerves: No cranial nerve deficit.     Sensory: No sensory deficit.     Motor: No weakness.     Coordination: Coordination normal.     Gait: Gait normal.     Deep Tendon Reflexes: Reflexes normal.  Psychiatric:        Mood and Affect: Mood normal.        Behavior: Behavior normal.        Thought Content: Thought content normal.        Judgment: Judgment normal.        Assessment & Plan:  1. Other migraine without status migrainosus, not intractable - rizatriptan (MAXALT) 5 MG tablet; Take 1 tablet (5 mg total) by mouth as needed.  Dispense: 10 tablet; Refill: 3  2. Medication refill - All medications refills   3. Routine general medical examination at a health care facility  - CBC with Differential/Platelet - Comprehensive metabolic panel - Hemoglobin A1c - Lipid panel - TSH - Magnesium  4. Uncontrolled type 2 diabetes mellitus with hyperglycemia (HCC) - Will switch to glipizide. Follow up in 3 months or sooner if needed - glipiZIDE (GLUCOTROL XL) 5 MG 24 hr tablet; Take 1 tablet (5 mg total) by mouth daily with breakfast.  Dispense: 90 tablet; Refill: 0 - CBC with Differential/Platelet - Comprehensive metabolic panel - Hemoglobin A1c - Lipid panel - TSH  5. Vitamin D deficiency  -  Vitamin D, 25-hydroxy  6. Vitamin B 12 deficiency  - Vitamin B12  7. Iron deficiency anemia, unspecified iron deficiency anemia type - Can take iron every other day to help with constipation. Also advised to drink more water, increase dietary fiber and exercise - CBC with Differential/Platelet - Comprehensive metabolic panel - Hemoglobin A1c - Lipid panel - TSH - IBC + Ferritin   [name], NP

## 2018-08-07 ENCOUNTER — Ambulatory Visit: Payer: PPO | Admitting: Rheumatology

## 2018-08-08 ENCOUNTER — Other Ambulatory Visit: Payer: Self-pay | Admitting: Family Medicine

## 2018-08-14 ENCOUNTER — Other Ambulatory Visit: Payer: Self-pay

## 2018-08-14 ENCOUNTER — Encounter: Payer: Self-pay | Admitting: Adult Health

## 2018-08-14 ENCOUNTER — Ambulatory Visit (INDEPENDENT_AMBULATORY_CARE_PROVIDER_SITE_OTHER): Payer: PPO | Admitting: Adult Health

## 2018-08-14 DIAGNOSIS — G43809 Other migraine, not intractable, without status migrainosus: Secondary | ICD-10-CM

## 2018-08-14 MED ORDER — AMITRIPTYLINE HCL 50 MG PO TABS: 50.0000 mg | ORAL_TABLET | Freq: Every day | ORAL | 0 refills | Status: DC

## 2018-08-14 NOTE — Progress Notes (Signed)
Virtual Visit via Video Note  I connected with Cheryln Balcom on 08/14/18 at  4:30 PM EDT by a video enabled telemedicine application and verified that I am speaking with the correct person using two identifiers.  Location patient: home Location provider:work or home office Persons participating in the virtual visit: patient, provider  I discussed the limitations of evaluation and management by telemedicine and the availability of in person appointments. The patient expressed understanding and agreed to proceed.   HPI: 53 year old female who is being evaluated today for recurrent migraines.  She had been migraine free for multiple months but when she was seen on 08/06/2018 she reported that she had recently started having migraines on a weekly basis.  She has been using Tylenol Excedrin which helped with the pain but that she was out of Maxalt.  A new prescription for Maxalt was sent in.  Today she reports that she continues to have couple migraines per week.  She would like to go back on Elavil and see if this provides any relief.  In the past she was on Elavil 25 mg nightly but did not get much relief from this medication.   ROS: See pertinent positives and negatives per HPI.  Past Medical History:  Diagnosis Date  . Anemia   . 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 Asthma 2002  . Depression    as a child, and stress recently  . Fibrocystic breast 2005  . LGSIL (low grade squamous intraepithelial lesion) on Pap smear 2007  . Libido, decreased 2010  . Migraines    migraines - otc med prn, then maxalt if needed  . Ovarian cyst   . 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 Tachycardia    per patient diagnosed in ER 2014, no problems currently  . Trichomonas   . Vaginosis 2008  . Vitamin D deficiency     Past Surgical History:  Procedure Laterality Date  . BREAST EXCISIONAL BIOPSY Left    benign  . BREAST SURGERY  1986   biopsy - benign  .  CYSTOSCOPY  12/26/2012   Procedure: CYSTOSCOPY;  Surgeon: Ena Dawley, MD;  Location: [facility];  Service: Gynecology;;  . DILATION AND CURETTAGE OF UTERUS    . ganglion cyst removed from wrist     left  . LAPAROSCOPIC ASSISTED VAGINAL HYSTERECTOMY N/A 12/26/2012   Procedure: LAPAROSCOPIC ASSISTED VAGINAL HYSTERECTOMY Uterine Morcellation, Bilateral Salpingectomy, ;  Surgeon: Ena Dawley, MD;  Location: [facility];  Service: Gynecology;  Laterality: N/A;  . MYOMECTOMY     In [place] - laparotomy  . OOPHORECTOMY  2000   laparotomy in [place]  . SHOULDER ARTHROSCOPY W/ ROTATOR CUFF REPAIR  2006   right  . TONSILLECTOMY AND ADENOIDECTOMY    . TUBAL LIGATION  2000  . WISDOM TOOTH EXTRACTION     x 2 teeth    Family History  Problem Relation Age of Onset  . Arthritis Mother   . Diabetes Mother   . Hypertension Mother   . Pulmonary embolism Mother        Cardiac arrest   . Heart disease Father   . Heart attack Father   . Mesothelioma Father   . Hyperlipidemia Maternal Grandmother   . Breast cancer Maternal Grandmother   . Colitis Paternal Aunt        in 23's  . Heart attack Paternal Aunt   . Heart attack Paternal Uncle   .  Stroke Paternal Aunt   . Breast cancer Maternal Aunt       Current Outpatient Medications:  .  albuterol (PROVENTIL HFA;VENTOLIN HFA) 108 (90 Base) MCG/ACT inhaler, Inhale 2 puffs into the lungs every 4 (four) hours as needed for wheezing. Dispense with aerochamber, Disp: 1 Inhaler, Rfl: 0 .  amitriptyline (ELAVIL) 25 MG tablet, TAKE 1 TABLET BY MOUTH AT BEDTIME, Disp: 30 tablet, Rfl: 0 .  Ascorbic Acid (VITAMIN C PO), Take by mouth., Disp: , Rfl:  .  Blood Glucose Monitoring Suppl ([place]) w/Device KIT, Test once daily. Dx: E11.9, Disp: 1 each, Rfl: 0 .  Cholecalciferol (VITAMIN D3 PO), Take 3,000 Units by mouth daily., Disp: , Rfl:  .  cyclobenzaprine (FLEXERIL) 10 MG tablet, TAKE 1 TABLET BY MOUTH EVERY 8 HOURS AS NEEDED FOR MUSCLE  SPASM, Disp: 30 tablet, Rfl: 0 .  Ferrous Sulfate (IRON PO), Take by mouth., Disp: , Rfl:  .  glipiZIDE (GLUCOTROL XL) 5 MG 24 hr tablet, Take 1 tablet (5 mg total) by mouth daily with breakfast., Disp: 90 tablet, Rfl: 0 .  glucose blood test strip, Use as instructed, Disp: 100 each, Rfl: 12 .  lisinopril (ZESTRIL) 2.5 MG tablet, Take 1 tablet (2.5 mg total) by mouth daily., Disp: 90 tablet, Rfl: 3 .  ONETOUCH DELICA LANCETS 74Q MISC, USE AS DIRECTED, Disp: 100 each, Rfl: 1 .  OVER THE COUNTER MEDICATION, Apply 1 application topically as needed. Theragesic Cream.  Applying to neck & back., Disp: , Rfl:  .  rizatriptan (MAXALT) 5 MG tablet, Take 1 tablet (5 mg total) by mouth as needed., Disp: 10 tablet, Rfl: 3 .  vitamin B-12 (CYANOCOBALAMIN) 1000 MCG tablet, Take 1,000 mcg by mouth daily., Disp: , Rfl:   EXAM:  VITALS per patient if applicable:  GENERAL: alert, oriented, appears well and in no acute distress  HEENT: atraumatic, conjunttiva clear, no obvious abnormalities on inspection of external nose and ears  NECK: normal movements of the head and neck  LUNGS: on inspection no signs of respiratory distress, breathing rate appears normal, no obvious gross SOB, gasping or wheezing  CV: no obvious cyanosis  MS: moves all visible extremities without noticeable abnormality  PSYCH/NEURO: pleasant and cooperative, no obvious depression or anxiety, speech and thought processing grossly intact  ASSESSMENT AND PLAN:  Discussed the following assessment and plan:  1. Other migraine without status migrainosus, not intractable - Will increase Elavil from 25 mg to 50 mg QHS. Follow up if no improvement. Consider referral to migraine clinic  - amitriptyline (ELAVIL) 50 MG tablet; Take 1 tablet (50 mg total) by mouth at bedtime.  Dispense: 90 tablet; Refill: 0      I discussed the assessment and treatment plan with the patient. The patient was provided an opportunity to ask questions and all  were answered. The patient agreed with the plan and demonstrated an understanding of the instructions.   The patient was advised to call back or seek an in-person evaluation if the symptoms worsen or if the condition fails to improve as anticipated.   [name], NP

## 2018-08-14 NOTE — Telephone Encounter (Signed)
Pt now scheduled to discuss medication options with Tommi Rumps at 4:30 PM today with [facility].

## 2018-08-20 NOTE — Progress Notes (Signed)
Office Visit Note  Patient: Karen Hardin             Date of Birth: 11/10/1965           MRN: [id number]             PCP: [name], NP Referring: [name], NP Visit Date: 08/29/2018 Occupation: Glass blower/designer  Subjective:  New Patient (Initial Visit) muscle pain and joint pain   History of Present Illness: Karen Hardin is a 53 y.o. female seen in consultation per request of her PCP.  According to patient she has had muscle pain at least for the last 5 years.  She describes pain mostly in her neck, upper back and lower back.  She states she is also had joint discomfort in her shoulders, bilateral hips and bilateral knee joints.  She states the pain moves around.  She denies any history of joint swelling.  She states that approximately 3 years ago she was having increased muscle pain around her rib cage at the time she has cardiology work-up which was normal.  She has been taking Elavil and Flexeril prescribed by her PCP which helps some extent.  She has not tried physical therapy.  She has been under a lot of stress.  Activities of Daily Living:  Patient reports morning stiffness for 5 minutes.   Patient Reports nocturnal pain.  Difficulty dressing/grooming: Denies Difficulty climbing stairs: Reports Difficulty getting out of chair: Reports Difficulty using hands for taps, buttons, cutlery, and/or writing: Denies  Review of Systems  Constitutional: Negative for fatigue, night sweats, weight gain and weight loss.  HENT: Positive for mouth dryness. Negative for mouth sores, trouble swallowing, trouble swallowing and nose dryness.   Eyes: Positive for visual disturbance and dryness. Negative for pain and redness.  Respiratory: Negative for cough, shortness of breath and difficulty breathing.   Cardiovascular: Negative for chest pain, palpitations, hypertension, irregular heartbeat and swelling in legs/feet.  Gastrointestinal: Positive for constipation. Negative  for blood in stool and diarrhea.  Endocrine: Negative for heat intolerance, excessive thirst and increased urination.  Genitourinary: Negative for difficulty urinating and vaginal dryness.  Musculoskeletal: Positive for arthralgias, joint pain, morning stiffness and muscle tenderness. Negative for joint swelling, myalgias, muscle weakness and myalgias.  Skin: Negative for color change, rash, hair loss, skin tightness, ulcers and sensitivity to sunlight.  Allergic/Immunologic: Negative for susceptible to infections.  Neurological: Positive for headaches. Negative for dizziness, numbness, memory loss, night sweats and weakness.  Hematological: Negative for bruising/bleeding tendency and swollen glands.  Psychiatric/Behavioral: Positive for sleep disturbance. Negative for depressed mood. The patient is not nervous/anxious.     PMFS History:  Patient Active Problem List   Diagnosis Date Noted  . Vitamin D deficiency 08/06/2018  . Vitamin B 12 deficiency 08/06/2018  . Iron deficiency anemia 08/06/2018  . Diabetes type 2, uncontrolled ([place]) 09/22/2015  . Asthma, chronic 05/11/2014  . Legal blindness 05/11/2014  . Chronic back pain 05/11/2014  . Migraines 05/11/2014  . Stargardt's disease   . Menorrhagia 09/11/2011    Past Medical History:  Diagnosis Date  . Anemia   . 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 Asthma 2002  . Depression    as a child, and stress recently  . Fibrocystic breast 2005  . LGSIL (low grade squamous intraepithelial lesion) on Pap smear 2007  . Libido, decreased 2010  . Migraines    migraines - otc med prn, then  maxalt if needed  . Ovarian cyst   . 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 Tachycardia    per patient diagnosed in ER 2014, no problems currently  . Trichomonas   . Vaginosis 2008  . Vitamin D deficiency     Family History  Problem Relation Age of Onset  . Arthritis Mother   . Diabetes Mother   . Hypertension  Mother   . Pulmonary embolism Mother        Cardiac arrest   . Heart disease Father   . Heart attack Father   . Mesothelioma Father   . Hyperlipidemia Maternal Grandmother   . Breast cancer Maternal Grandmother   . Colitis Paternal Aunt        in 65's  . Heart attack Paternal Aunt   . Heart attack Paternal Uncle   . Stroke Paternal Aunt   . Breast cancer Maternal Aunt    Past Surgical History:  Procedure Laterality Date  . BREAST EXCISIONAL BIOPSY Left    benign  . BREAST SURGERY  1986   biopsy - benign  . CYSTOSCOPY  12/26/2012   Procedure: CYSTOSCOPY;  Surgeon: Ena Dawley, MD;  Location: [facility];  Service: Gynecology;;  . DILATION AND CURETTAGE OF UTERUS    . ganglion cyst removed from wrist     left  . LAPAROSCOPIC ASSISTED VAGINAL HYSTERECTOMY N/A 12/26/2012   Procedure: LAPAROSCOPIC ASSISTED VAGINAL HYSTERECTOMY Uterine Morcellation, Bilateral Salpingectomy, ;  Surgeon: Ena Dawley, MD;  Location: [facility];  Service: Gynecology;  Laterality: N/A;  . MYOMECTOMY     In [place] - laparotomy  . OOPHORECTOMY  2000   laparotomy in [place]  . SHOULDER ARTHROSCOPY W/ ROTATOR CUFF REPAIR  2006   right  . TONSILLECTOMY AND ADENOIDECTOMY    . TUBAL LIGATION  2000  . WISDOM TOOTH EXTRACTION     x 2 teeth   Social History   Social History Narrative   Is on disability    Married for 21 years    Three boys ( all three live locally)       She likes to hang out with friends. 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    Immunization History  Administered Date(s) Administered  . Influenza,inj,Quad PF,6+ Mos 12/27/2012, 03/08/2015, 01/24/2016, 03/29/2017  . Pneumococcal Polysaccharide-23 12/27/2012  . Tdap 01/24/2016     Objective: Vital Signs: BP 130/78 (BP Location: Right Arm, Patient Position: Sitting, Cuff Size: Normal)   Pulse 89   Resp 16   Ht 5\' 8"  (1.727 m)   Wt 215 lb 6.4 oz (97.7 kg)   LMP 12/14/2012   BMI 32.75 kg/m     Physical Exam Vitals signs and nursing note reviewed.  Constitutional:      Appearance: She is well-developed.  HENT:     Head: Normocephalic and atraumatic.  Eyes:     Conjunctiva/sclera: Conjunctivae normal.  Neck:     Musculoskeletal: Normal range of motion.  Cardiovascular:     Rate and Rhythm: Normal rate and regular rhythm.     Heart sounds: Normal heart sounds.  Pulmonary:     Effort: Pulmonary effort is normal.     Breath sounds: Normal breath sounds.  Abdominal:     General: Bowel sounds are normal.     Palpations: Abdomen is soft.  Lymphadenopathy:     Cervical: No cervical adenopathy.  Skin:    General: Skin is warm and dry.  Capillary Refill: Capillary refill takes less than 2 seconds.  Neurological:     Mental Status: She is alert and oriented to person, place, and time.  Psychiatric:        Behavior: Behavior normal.      Musculoskeletal Exam: C-spine thoracic and lumbar spine with good range of motion.  She has  accentuated lumbar lordosis.  She has tenderness on palpation of bilateral trapezius area.  She also had tenderness over bilateral gluteal area.  Shoulder joints, elbow joints, wrist joints, MCPs, PIPs and DIPs with good range of motion with no synovitis.  Hip joints, knee joints, ankles and MTPs with good range of motion.  She has discomfort range of motion of bilateral knee joints.  She was able to get up from the squatting position with some discomfort in her knee joints.  No muscular weakness was noted.  CDAI Exam: CDAI Score: - Patient Global: -; Provider Global: - Swollen: -; Tender: - Joint Exam   No joint exam has been documented for this visit   There is currently no information documented on the homunculus. Go to the Rheumatology activity and complete the homunculus joint exam.  Investigation: Findings:  02/18/18: sed rate 32, CRP 0.9, ANA negative, RF<14  Component     Latest Ref Rng & Units 02/18/2018  Sed Rate     0 - 30 mm/hr  32 (H)  CRP     0.5 - 20.0 mg/dL 0.9  Anti Nuclear Antibody (ANA)     NEGATIVE NEGATIVE  RA Latex Turbid.     <14 IU/mL <14   Imaging: Xr Knee 3 View Left  Result Date: 08/29/2018 No medial lateral compartment narrowing was noted.  No chondrocalcinosis was noted.  Severe patellofemoral narrowing was noted. 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  Xr Knee 3 View Right  Result Date: 08/29/2018 No significant medial lateral compartment narrowing was noted.  No chondrocalcinosis was noted.  Moderate patellofemoral narrowing was noted. 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  Xr Lumbar Spine 2-3 Views  Result Date: 08/29/2018 No significant disc space narrowing was noted.  Mild facet joint arthropathy was noted.  Mild anterior spurring was noted.  No SI joint sclerosis was noted. Impression: These findings are consistent with lumbar facet joint arthropathy.   Recent Labs: Lab Results  Component Value Date   WBC 8.3 08/06/2018   HGB 12.5 08/06/2018   PLT 296.0 08/06/2018   NA 142 08/06/2018   K 4.1 08/06/2018   CL 107 08/06/2018   CO2 27 08/06/2018   GLUCOSE 97 08/06/2018   BUN 7 08/06/2018   CREATININE 0.68 08/06/2018   BILITOT 0.5 08/06/2018   ALKPHOS 80 08/06/2018   AST 13 08/06/2018   ALT 12 08/06/2018   PROT 6.8 08/06/2018   ALBUMIN 4.2 08/06/2018   CALCIUM 9.5 08/06/2018   GFRAA >90 12/19/2012  August 06, 2018 CBC normal, CMP normal, TSH normal, vitamin D normal, B12 normal, magnesium normal  X-ray of the cervical spine from August 22, 2017 showed degenerative changes in C5-6 and C6-7. X-ray of the thoracic spine from August 22, 2017 showed mild degenerative changes  Speciality Comments: No specialty comments available.  Procedures:  No procedures performed Allergies: Patient has no known allergies.   Assessment / Plan:     Visit Diagnoses: Myalgia - 02/18/18: sed rate 32, CRP 0.9, ANA  negative, RF<14 -patient complains of generalized muscle pain.  There is no obvious muscle weakness or tenderness  on examination.  I believe some of her symptoms are coming from underlying myofascial pain syndrome.  I will obtain following labs today.  Plan: CK, Aldolase,   Chronic midline low back pain without sciatica - Plan: XR Lumbar Spine 2-3 Views, she had good mobility in the lumbar spine.  The x-ray showed facet joint arthropathy.  Have given her a handout on back exercises.  DDD (degenerative disc disease), cervical -I reviewed her previous films of her cervical spine which showed some degenerative changes.  DDD (degenerative disc disease), thoracic -thoracic spine x-rays from the past also showed mild degenerative changes.  Need for regular exercise was emphasized.  She may benefit from sewing.  Chronic pain of both knees -she complains of bilateral knee joint discomfort.  No warmth swelling or effusion was noted.  Plan: XR KNEE 3 VIEW RIGHT, XR KNEE 3 VIEW LEFT, x-rays revealed bilateral chondromalacia patella.  No osteoarthritic changes were noted.  Have given her handout on knee exercises.  Hypermobility arthralgia -she has hypermobility in multiple joints.  Isometric exercises were emphasized.  Hypermobility also contributes to myofascial pain syndrome and arthralgias.  Myofascial pain -she has generalized hyperalgesia and positive tender points.  She may benefit from a nondrowsy muscle relaxant.  She states Flexeril makes her very drowsy.  Have advised her to discuss that further with her PCP.  Other medical problems are listed as follows:  Vitamin B 12 deficiency   Vitamin D deficiency  Uncontrolled type 2 diabetes mellitus with hyperglycemia ([place]) -   Legal blindness   Stargardt's disease  History of asthma  History of migraine  Orders: Orders Placed This Encounter  Procedures  . XR KNEE 3 VIEW RIGHT  . XR KNEE 3 VIEW LEFT  . XR Lumbar Spine 2-3 Views  . CK  .  Aldolase   No orders of the defined types were placed in this encounter.     Follow-Up Instructions: Return for Myalgia.   Bo Merino, MD  Note - This record has been created using Editor, commissioning.  Chart creation errors have been sought, but may not always  have been located. Such creation errors do not reflect on  the standard of medical care.

## 2018-08-29 ENCOUNTER — Ambulatory Visit: Payer: Self-pay

## 2018-08-29 ENCOUNTER — Ambulatory Visit (INDEPENDENT_AMBULATORY_CARE_PROVIDER_SITE_OTHER): Payer: PPO

## 2018-08-29 ENCOUNTER — Ambulatory Visit: Payer: PPO | Admitting: Rheumatology

## 2018-08-29 ENCOUNTER — Other Ambulatory Visit: Payer: Self-pay

## 2018-08-29 ENCOUNTER — Encounter: Payer: Self-pay | Admitting: Rheumatology

## 2018-08-29 VITALS — BP 130/78 | HR 89 | Resp 16 | Ht 68.0 in | Wt 215.4 lb

## 2018-08-29 DIAGNOSIS — M7918 Myalgia, other site: Secondary | ICD-10-CM

## 2018-08-29 DIAGNOSIS — G8929 Other chronic pain: Secondary | ICD-10-CM | POA: Diagnosis not present

## 2018-08-29 DIAGNOSIS — M25562 Pain in left knee: Secondary | ICD-10-CM

## 2018-08-29 DIAGNOSIS — Z8669 Personal history of other diseases of the nervous system and sense organs: Secondary | ICD-10-CM

## 2018-08-29 DIAGNOSIS — M25561 Pain in right knee: Secondary | ICD-10-CM

## 2018-08-29 DIAGNOSIS — M545 Low back pain, unspecified: Secondary | ICD-10-CM

## 2018-08-29 DIAGNOSIS — M2550 Pain in unspecified joint: Secondary | ICD-10-CM

## 2018-08-29 DIAGNOSIS — E538 Deficiency of other specified B group vitamins: Secondary | ICD-10-CM

## 2018-08-29 DIAGNOSIS — H3553 Other dystrophies primarily involving the sensory retina: Secondary | ICD-10-CM | POA: Diagnosis not present

## 2018-08-29 DIAGNOSIS — Z8709 Personal history of other diseases of the respiratory system: Secondary | ICD-10-CM

## 2018-08-29 DIAGNOSIS — H548 Legal blindness, as defined in USA: Secondary | ICD-10-CM | POA: Diagnosis not present

## 2018-08-29 DIAGNOSIS — M5030 Other cervical disc degeneration, unspecified cervical region: Secondary | ICD-10-CM | POA: Diagnosis not present

## 2018-08-29 DIAGNOSIS — M5134 Other intervertebral disc degeneration, thoracic region: Secondary | ICD-10-CM

## 2018-08-29 DIAGNOSIS — M791 Myalgia, unspecified site: Secondary | ICD-10-CM

## 2018-08-29 DIAGNOSIS — E559 Vitamin D deficiency, unspecified: Secondary | ICD-10-CM

## 2018-08-29 DIAGNOSIS — E1165 Type 2 diabetes mellitus with hyperglycemia: Secondary | ICD-10-CM | POA: Diagnosis not present

## 2018-08-29 NOTE — Patient Instructions (Signed)
Journal for Nurse Practitioners, 15(4), 263-267. Retrieved November 18, 2017 from [email or web address]">  Knee Exercises Ask your health care provider which exercises are safe for you. Do exercises exactly as told by your health care provider and adjust them as directed. It is normal to feel mild stretching, pulling, tightness, or discomfort as you do these exercises. Stop right away if you feel sudden pain or your pain gets worse. Do not begin these exercises until told by your health care provider. Stretching and range-of-motion exercises These exercises warm up your muscles and joints and improve the movement and flexibility of your knee. These exercises also help to relieve pain and swelling. Knee extension, prone 1. Lie on your abdomen (prone position) on a bed. 2. Place your left / right knee just beyond the edge of the surface so your knee is not on the bed. You can put a towel under your left / right thigh just above your kneecap for comfort. 3. Relax your leg muscles and allow gravity to straighten your knee (extension). You should feel a stretch behind your left / right knee. 4. Hold this position for __________ seconds. 5. Scoot up so your knee is supported between repetitions. Repeat __________ times. Complete this exercise __________ times a day. Knee flexion, active  1. Lie on your back with both legs straight. If this causes back discomfort, bend your left / right knee so your foot is flat on the floor. 2. Slowly slide your left / right heel back toward your buttocks. Stop when you feel a gentle stretch in the front of your knee or thigh (flexion). 3. Hold this position for __________ seconds. 4. Slowly slide your left / right heel back to the starting position. Repeat __________ times. Complete this exercise __________ times a day. Quadriceps stretch, prone  1. Lie on your abdomen on a firm surface, such as a bed or padded floor. 2. Bend your left / right knee and hold  your ankle. If you cannot reach your ankle or pant leg, loop a belt around your foot and grab the belt instead. 3. Gently pull your heel toward your buttocks. Your knee should not slide out to the side. You should feel a stretch in the front of your thigh and knee (quadriceps). 4. Hold this position for __________ seconds. Repeat __________ times. Complete this exercise __________ times a day. Hamstring, supine 1. Lie on your back (supine position). 2. Loop a belt or towel over the ball of your left / right foot. The ball of your foot is on the walking surface, right under your toes. 3. Straighten your left / right knee and slowly pull on the belt to raise your leg until you feel a gentle stretch behind your knee (hamstring). ? Do not let your knee bend while you do this. ? Keep your other leg flat on the floor. 4. Hold this position for __________ seconds. Repeat __________ times. Complete this exercise __________ times a day. Strengthening exercises These exercises build strength and endurance in your knee. Endurance is the ability to use your muscles for a long time, even after they get tired. Quadriceps, isometric This exercise stretches the muscles in front of your thigh (quadriceps) without moving your knee joint (isometric). 1. Lie on your back with your left / right leg extended and your other knee bent. Put a rolled towel or small pillow under your knee if told by your health care provider. 2. Slowly tense the muscles in the front of your left /   right thigh. You should see your kneecap slide up toward your hip or see increased dimpling just above the knee. This motion will push the back of the knee toward the floor. 3. For __________ seconds, hold the muscle as tight as you can without increasing your pain. 4. Relax the muscles slowly and completely. Repeat __________ times. Complete this exercise __________ times a day. Straight leg raises This exercise stretches the muscles in front  of your thigh (quadriceps) and the muscles that move your hips (hip flexors). 1. Lie on your back with your left / right leg extended and your other knee bent. 2. Tense the muscles in the front of your left / right thigh. You should see your kneecap slide up or see increased dimpling just above the knee. Your thigh may even shake a bit. 3. Keep these muscles tight as you raise your leg 4-6 inches (10-15 cm) off the floor. Do not let your knee bend. 4. Hold this position for __________ seconds. 5. Keep these muscles tense as you lower your leg. 6. Relax your muscles slowly and completely after each repetition. Repeat __________ times. Complete this exercise __________ times a day. Hamstring, isometric 1. Lie on your back on a firm surface. 2. Bend your left / right knee about __________ degrees. 3. Dig your left / right heel into the surface as if you are trying to pull it toward your buttocks. Tighten the muscles in the back of your thighs (hamstring) to "dig" as hard as you can without increasing any pain. 4. Hold this position for __________ seconds. 5. Release the tension gradually and allow your muscles to relax completely for __________ seconds after each repetition. Repeat __________ times. Complete this exercise __________ times a day. Hamstring curls If told by your health care provider, do this exercise while wearing ankle weights. Begin with __________ lb weights. Then increase the weight by 1 lb (0.5 kg) increments. Do not wear ankle weights that are more than __________ lb. 1. Lie on your abdomen with your legs straight. 2. Bend your left / right knee as far as you can without feeling pain. Keep your hips flat against the floor. 3. Hold this position for __________ seconds. 4. Slowly lower your leg to the starting position. Repeat __________ times. Complete this exercise __________ times a day. Squats This exercise strengthens the muscles in front of your thigh and knee  (quadriceps). 1. Stand in front of a table, with your feet and knees pointing straight ahead. You may rest your hands on the table for balance but not for support. 2. Slowly bend your knees and lower your hips like you are going to sit in a chair. ? Keep your weight over your heels, not over your toes. ? Keep your lower legs upright so they are parallel with the table legs. ? Do not let your hips go lower than your knees. ? Do not bend lower than told by your health care provider. ? If your knee pain increases, do not bend as low. 3. Hold the squat position for __________ seconds. 4. Slowly push with your legs to return to standing. Do not use your hands to pull yourself to standing. Repeat __________ times. Complete this exercise __________ times a day. Wall slides This exercise strengthens the muscles in front of your thigh and knee (quadriceps). 1. Lean your back against a smooth wall or door, and walk your feet out 18-24 inches (46-61 cm) from it. 2. Place your feet hip-width apart. 3.   Slowly slide down the wall or door until your knees bend __________ degrees. Keep your knees over your heels, not over your toes. Keep your knees in line with your hips. 4. Hold this position for __________ seconds. Repeat __________ times. Complete this exercise __________ times a day. Straight leg raises This exercise strengthens the muscles that rotate the leg at the hip and move it away from your body (hip abductors). 1. Lie on your side with your left / right leg in the top position. Lie so your head, shoulder, knee, and hip line up. You may bend your bottom knee to help you keep your balance. 2. Roll your hips slightly forward so your hips are stacked directly over each other and your left / right knee is facing forward. 3. Leading with your heel, lift your top leg 4-6 inches (10-15 cm). You should feel the muscles in your outer hip lifting. ? Do not let your foot drift forward. ? Do not let your knee  roll toward the ceiling. 4. Hold this position for __________ seconds. 5. Slowly return your leg to the starting position. 6. Let your muscles relax completely after each repetition. Repeat __________ times. Complete this exercise __________ times a day. Straight leg raises This exercise stretches the muscles that move your hips away from the front of the pelvis (hip extensors). 1. Lie on your abdomen on a firm surface. You can put a pillow under your hips if that is more comfortable. 2. Tense the muscles in your buttocks and lift your left / right leg about 4-6 inches (10-15 cm). Keep your knee straight as you lift your leg. 3. Hold this position for __________ seconds. 4. Slowly lower your leg to the starting position. 5. Let your leg relax completely after each repetition. Repeat __________ times. Complete this exercise __________ times a day. This information is not intended to replace advice given to you by your health care provider. Make sure you discuss any questions you have with your health care provider. Document Released: 12/13/2004 Document Revised: 11/19/2017 Document Reviewed: 11/19/2017 [employer] Patient Education  2020 [employer] Back Exercises The following exercises strengthen the muscles that help to support the trunk and back. They also help to keep the lower back flexible. Doing these exercises can help to prevent back pain or lessen existing pain.  If you have back pain or discomfort, try doing these exercises 2-3 times each day or as told by your health care provider.  As your pain improves, do them once each day, but increase the number of times that you repeat the steps for each exercise (do more repetitions).  To prevent the recurrence of back pain, continue to do these exercises once each day or as told by your health care provider. Do exercises exactly as told by your health care provider and adjust them as directed. It is normal to feel mild stretching,  pulling, tightness, or discomfort as you do these exercises, but you should stop right away if you feel sudden pain or your pain gets worse. Exercises Single knee to chest Repeat these steps 3-5 times for each leg: 1. Lie on your back on a firm bed or the floor with your legs extended. 2. Bring one knee to your chest. Your other leg should stay extended and in contact with the floor. 3. Hold your knee in place by grabbing your knee or thigh with both hands and hold. 4. Pull on your knee until you feel a gentle stretch in your   lower back or buttocks. 5. Hold the stretch for 10-30 seconds. 6. Slowly release and straighten your leg. Pelvic tilt Repeat these steps 5-10 times: 1. Lie on your back on a firm bed or the floor with your legs extended. 2. Bend your knees so they are pointing toward the ceiling and your feet are flat on the floor. 3. Tighten your lower abdominal muscles to press your lower back against the floor. This motion will tilt your pelvis so your tailbone points up toward the ceiling instead of pointing to your feet or the floor. 4. With gentle tension and even breathing, hold this position for 5-10 seconds. Cat-cow Repeat these steps until your lower back becomes more flexible: 1. Get into a hands-and-knees position on a firm surface. Keep your hands under your shoulders, and keep your knees under your hips. You may place padding under your knees for comfort. 2. Let your head hang down toward your chest. Contract your abdominal muscles and point your tailbone toward the floor so your lower back becomes rounded like the back of a cat. 3. Hold this position for 5 seconds. 4. Slowly lift your head, let your abdominal muscles relax and point your tailbone up toward the ceiling so your back forms a sagging arch like the back of a cow. 5. Hold this position for 5 seconds.  Press-ups Repeat these steps 5-10 times: 1. Lie on your abdomen (face-down) on the floor. 2. Place your palms  near your head, about shoulder-width apart. 3. Keeping your back as relaxed as possible and keeping your hips on the floor, slowly straighten your arms to raise the top half of your body and lift your shoulders. Do not use your back muscles to raise your upper torso. You may adjust the placement of your hands to make yourself more comfortable. 4. Hold this position for 5 seconds while you keep your back relaxed. 5. Slowly return to lying flat on the floor.  Bridges Repeat these steps 10 times: 1. Lie on your back on a firm surface. 2. Bend your knees so they are pointing toward the ceiling and your feet are flat on the floor. Your arms should be flat at your sides, next to your body. 3. Tighten your buttocks muscles and lift your buttocks off the floor until your waist is at almost the same height as your knees. You should feel the muscles working in your buttocks and the back of your thighs. If you do not feel these muscles, slide your feet 1-2 inches farther away from your buttocks. 4. Hold this position for 3-5 seconds. 5. Slowly lower your hips to the starting position, and allow your buttocks muscles to relax completely. If this exercise is too easy, try doing it with your arms crossed over your chest. Abdominal crunches Repeat these steps 5-10 times: 1. Lie on your back on a firm bed or the floor with your legs extended. 2. Bend your knees so they are pointing toward the ceiling and your feet are flat on the floor. 3. Cross your arms over your chest. 4. Tip your chin slightly toward your chest without bending your neck. 5. Tighten your abdominal muscles and slowly raise your trunk (torso) high enough to lift your shoulder blades a tiny bit off the floor. Avoid raising your torso higher than that because it can put too much stress on your low back and does not help to strengthen your abdominal muscles. 6. Slowly return to your starting position. Back lifts Repeat these   steps 5-10 times:  1. Lie on your abdomen (face-down) with your arms at your sides, and rest your forehead on the floor. 2. Tighten the muscles in your legs and your buttocks. 3. Slowly lift your chest off the floor while you keep your hips pressed to the floor. Keep the back of your head in line with the curve in your back. Your eyes should be looking at the floor. 4. Hold this position for 3-5 seconds. 5. Slowly return to your starting position. Contact a health care provider if:  Your back pain or discomfort gets much worse when you do an exercise.  Your worsening back pain or discomfort does not lessen within 2 hours after you exercise. If you have any of these problems, stop doing these exercises right away. Do not do them again unless your health care provider says that you can. Get help right away if:  You develop sudden, severe back pain. If this happens, stop doing the exercises right away. Do not do them again unless your health care provider says that you can. This information is not intended to replace advice given to you by your health care provider. Make sure you discuss any questions you have with your health care provider. Document Released: 03/08/2004 Document Revised: 06/05/2018 Document Reviewed: 10/31/2017 [employer] Patient Education  2020 [employer]  

## 2018-09-01 LAB — CK: Total CK: 71 U/L (ref 29–143)

## 2018-09-01 LAB — ALDOLASE: Aldolase: 3.7 U/L (ref <=8.1)

## 2018-09-02 NOTE — Progress Notes (Signed)
Muscle enzyme tests are normal.

## 2018-09-11 ENCOUNTER — Ambulatory Visit: Payer: PPO | Admitting: Rheumatology

## 2018-09-22 NOTE — Progress Notes (Signed)
Office Visit Note  Patient: Karen Hardin             Date of Birth: [birth year]           MRN: [id number]             PCP: [name], NP Referring: [name], NP Visit Date: 10/01/2018 Occupation: @GUAROCC @  Subjective:  Generalized pain.   History of Present Illness: Karen Hardin is a 53 y.o. female with history of myofascial pain syndrome and degenerative disc disease.  She states she has been having a lot of discomfort in her bilateral shoulders.  She is also experiencing generalized pain in her cervical, thoracic and lumbar spine.  She states she also has discomfort in the costochondral joints and rib cage.  She has generalized joints and muscle pain.  No joint swelling.  Activities of Daily Living:  Patient reports morning stiffness for several hours.   Patient Reports nocturnal pain.  Difficulty dressing/grooming: Denies Difficulty climbing stairs: Denies Difficulty getting out of chair: Reports Difficulty using hands for taps, buttons, cutlery, and/or writing: Denies  Review of Systems  Constitutional: Negative for fatigue.  HENT: Negative for mouth sores, mouth dryness and nose dryness.   Eyes: Negative for pain, itching and dryness.  Respiratory: Negative for shortness of breath, wheezing and difficulty breathing.   Cardiovascular: Negative for chest pain, palpitations and swelling in legs/feet.  Gastrointestinal: Negative for blood in stool, constipation and diarrhea.  Endocrine: Negative for increased urination.  Genitourinary: Negative for difficulty urinating and painful urination.  Musculoskeletal: Positive for arthralgias, joint pain, morning stiffness and muscle tenderness. Negative for joint swelling.  Skin: Negative for rash and redness.  Allergic/Immunologic: Negative for susceptible to infections.  Neurological: Negative for dizziness, light-headedness, headaches, memory loss and weakness.  Hematological: Negative for swollen glands.    Psychiatric/Behavioral: Positive for sleep disturbance. Negative for confusion.    PMFS History:  Patient Active Problem List   Diagnosis Date Noted   Vitamin D deficiency 08/06/2018   Vitamin B 12 deficiency 08/06/2018   Iron deficiency anemia 08/06/2018   Diabetes type 2, uncontrolled ([place]) 09/22/2015   Asthma, chronic 05/11/2014   Legal blindness 05/11/2014   Chronic back pain 05/11/2014   Migraines 05/11/2014   Stargardt's disease    Menorrhagia 09/11/2011    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   Asthma 2002   Depression    as a child, and stress recently   Fibrocystic breast 2005   LGSIL (low grade squamous intraepithelial lesion) on Pap smear 2007   Libido, decreased 2010   Migraines    migraines - otc med prn, then maxalt if needed   Ovarian cyst    Stargardt's disease 1986   Patient is legally blind   Tachycardia    per patient diagnosed in ER 2014, no problems currently   Trichomonas    Vaginosis 2008   Vitamin D deficiency     Family History  Problem Relation Age of Onset   Arthritis Mother    Diabetes Mother    Hypertension Mother    Pulmonary embolism Mother        Cardiac arrest    Heart disease Father    Heart attack Father    Mesothelioma Father    Hyperlipidemia Maternal Grandmother    Breast cancer Maternal Grandmother    Colitis Paternal Aunt  in 70's   Heart attack Paternal Aunt    Heart attack Paternal Uncle    Stroke Paternal Aunt    Breast cancer Maternal Aunt    Past Surgical History:  Procedure Laterality Date   BREAST EXCISIONAL BIOPSY Left    benign   BREAST SURGERY  1986   biopsy - benign   CYSTOSCOPY  12/26/2012   Procedure: CYSTOSCOPY;  Surgeon: Ena Dawley, MD;  Location: [facility];  Service: Gynecology;;   DILATION AND CURETTAGE OF UTERUS     ganglion cyst removed from wrist     left   LAPAROSCOPIC  ASSISTED VAGINAL HYSTERECTOMY N/A 12/26/2012   Procedure: LAPAROSCOPIC ASSISTED VAGINAL HYSTERECTOMY Uterine Morcellation, Bilateral Salpingectomy, ;  Surgeon: Ena Dawley, MD;  Location: [facility];  Service: Gynecology;  Laterality: N/A;   MYOMECTOMY     In [place] - laparotomy   OOPHORECTOMY  2000   laparotomy in [place]   SHOULDER ARTHROSCOPY W/ ROTATOR CUFF REPAIR  2006   right   TONSILLECTOMY AND ADENOIDECTOMY     TUBAL LIGATION  2000   WISDOM TOOTH EXTRACTION     x 2 teeth   Social History   Social History Narrative   Is on disability    Married for 21 years    Three boys ( all three live locally)       She likes to hang out with friends. 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    Immunization History  Administered Date(s) Administered   Influenza,inj,Quad PF,6+ Mos 12/27/2012, 03/08/2015, 01/24/2016, 03/29/2017   Pneumococcal Polysaccharide-23 12/27/2012   Tdap 01/24/2016     Objective: Vital Signs: BP 136/83 (BP Location: Left Arm, Patient Position: Sitting, Cuff Size: Large)    Pulse 88    Resp 14    Ht 5\' 8"  (1.727 m)    Wt 213 lb (96.6 kg)    LMP 12/14/2012    BMI 32.39 kg/m    Physical Exam Vitals signs and nursing note reviewed.  Constitutional:      Appearance: She is well-developed.  HENT:     Head: Normocephalic and atraumatic.  Eyes:     Conjunctiva/sclera: Conjunctivae normal.  Neck:     Musculoskeletal: Normal range of motion.  Cardiovascular:     Rate and Rhythm: Normal rate and regular rhythm.     Heart sounds: Normal heart sounds.  Pulmonary:     Effort: Pulmonary effort is normal.     Breath sounds: Normal breath sounds.  Abdominal:     General: Bowel sounds are normal.     Palpations: Abdomen is soft.  Lymphadenopathy:     Cervical: No cervical adenopathy.  Skin:    General: Skin is warm and dry.     Capillary Refill: Capillary refill takes less than 2 seconds.  Neurological:     Mental  Status: She is alert and oriented to person, place, and time.  Psychiatric:        Behavior: Behavior normal.      Musculoskeletal Exam: C-spine thoracic and lumbar spine were in good range of motion with discomfort.  She had good range of motion in bilateral shoulders.  She states she has discomfort range of motion of her shoulders.  Elbow joints, wrist joints, MCPs, PIPs and DIPs with good range of motion with no synovitis.  Hip joints, knee joints, ankles, MTPs and PIPs with good range of motion with no synovitis.  She has generalized hyperalgesia and positive tender  points.  CDAI Exam: CDAI Score: -- Patient Global: --; Provider Global: -- Swollen: --; Tender: -- Joint Exam   No joint exam has been documented for this visit   There is currently no information documented on the homunculus. Go to the Rheumatology activity and complete the homunculus joint exam.  Investigation: No additional findings.  Imaging: No results found.  Recent Labs: Lab Results  Component Value Date   WBC 8.3 08/06/2018   HGB 12.5 08/06/2018   PLT 296.0 08/06/2018   NA 142 08/06/2018   K 4.1 08/06/2018   CL 107 08/06/2018   CO2 27 08/06/2018   GLUCOSE 97 08/06/2018   BUN 7 08/06/2018   CREATININE 0.68 08/06/2018   BILITOT 0.5 08/06/2018   ALKPHOS 80 08/06/2018   AST 13 08/06/2018   ALT 12 08/06/2018   PROT 6.8 08/06/2018   ALBUMIN 4.2 08/06/2018   CALCIUM 9.5 08/06/2018   GFRAA >90 12/19/2012  CK 71, aldolase 3.7  Speciality Comments: No specialty comments available.  Procedures:  No procedures performed Allergies: Patient has no known allergies.   Assessment / Plan:     Visit Diagnoses: Myofascial pain - History of generalized muscle pain.  CK and aldolase were normal.  She continues with generalized pain and discomfort.  Detailed counseling regarding myofascial pain and fibromyalgia was provided.  I have advised her to follow-up with her PCP for future treatment.  She is already on  Flexeril.  She may benefit from Cymbalta.  In the meantime I will refer her to physical therapy for her bilateral shoulder joint pain and myofascial pain.  Hypermobility arthralgia -she has hypermobility in multiple joints which also contribute to arthralgias.  Isometric exercises were discussed.  Arthropathy of lumbar facet joint -she has chronic lower back pain.  She was given a handout on back exercises at the last visit.  DDD (degenerative disc disease), cervical -based on her previous x-rays which were reviewed last visit.  DDD (degenerative disc disease), thoracic -she has chronic thoracic pain.  Chondromalacia of patella, bilateral -I gave her a handout on knee joint exercises.  Have emphasized doing regular exercise.  Weight loss and exercise will benefit.  Other medical problems are listed as follows:  Vitamin B 12 deficiency   Vitamin D deficiency  Uncontrolled type 2 diabetes mellitus with hyperglycemia ([place]) -   Legal blindness   Stargardt's disease  History of asthma  History of migraine  Orders: Orders Placed This Encounter  Procedures   Ambulatory referral to Physical Therapy   No orders of the defined types were placed in this encounter.   Face-to-face time spent with patient was 25 minutes. Greater than 50% of time was spent in counseling and coordination of care.  Follow-Up Instructions: Return if symptoms worsen or fail to improve, for MFPS, DDD.   Bo Merino, MD  Note - This record has been created using Editor, commissioning.  Chart creation errors have been sought, but may not always  have been located. Such creation errors do not reflect on  the standard of medical care.

## 2018-10-01 ENCOUNTER — Ambulatory Visit (INDEPENDENT_AMBULATORY_CARE_PROVIDER_SITE_OTHER): Payer: PPO | Admitting: Rheumatology

## 2018-10-01 ENCOUNTER — Other Ambulatory Visit: Payer: Self-pay

## 2018-10-01 ENCOUNTER — Encounter: Payer: Self-pay | Admitting: Rheumatology

## 2018-10-01 VITALS — BP 136/83 | HR 88 | Resp 14 | Ht 68.0 in | Wt 213.0 lb

## 2018-10-01 DIAGNOSIS — M47816 Spondylosis without myelopathy or radiculopathy, lumbar region: Secondary | ICD-10-CM | POA: Diagnosis not present

## 2018-10-01 DIAGNOSIS — M5134 Other intervertebral disc degeneration, thoracic region: Secondary | ICD-10-CM

## 2018-10-01 DIAGNOSIS — M2240 Chondromalacia patellae, unspecified knee: Secondary | ICD-10-CM | POA: Diagnosis not present

## 2018-10-01 DIAGNOSIS — M2550 Pain in unspecified joint: Secondary | ICD-10-CM | POA: Diagnosis not present

## 2018-10-01 DIAGNOSIS — M7918 Myalgia, other site: Secondary | ICD-10-CM

## 2018-10-01 DIAGNOSIS — M5030 Other cervical disc degeneration, unspecified cervical region: Secondary | ICD-10-CM

## 2018-10-01 NOTE — Patient Instructions (Signed)
Shoulder Exercises Ask your health care provider which exercises are safe for you. Do exercises exactly as told by your health care provider and adjust them as directed. It is normal to feel mild stretching, pulling, tightness, or discomfort as you do these exercises. Stop right away if you feel sudden pain or your pain gets worse. Do not begin these exercises until told by your health care provider. Stretching exercises External rotation and abduction This exercise is sometimes called corner stretch. This exercise rotates your arm outward (external rotation) and moves your arm out from your body (abduction). 1. Stand in a doorway with one of your feet slightly in front of the other. This is called a staggered stance. If you cannot reach your forearms to the door frame, stand facing a corner of a room. 2. Choose one of the following positions as told by your health care provider: ? Place your hands and forearms on the door frame above your head. ? Place your hands and forearms on the door frame at the height of your head. ? Place your hands on the door frame at the height of your elbows. 3. Slowly move your weight onto your front foot until you feel a stretch across your chest and in the front of your shoulders. Keep your head and chest upright and keep your abdominal muscles tight. 4. Hold for __________ seconds. 5. To release the stretch, shift your weight to your back foot. Repeat __________ times. Complete this exercise __________ times a day. Extension, standing 1. Stand and hold a broomstick, a cane, or a similar object behind your back. ? Your hands should be a little wider than shoulder width apart. ? Your palms should face away from your back. 2. Keeping your elbows straight and your shoulder muscles relaxed, move the stick away from your body until you feel a stretch in your shoulders (extension). ? Avoid shrugging your shoulders while you move the stick. Keep your shoulder blades tucked  down toward the middle of your back. 3. Hold for __________ seconds. 4. Slowly return to the starting position. Repeat __________ times. Complete this exercise __________ times a day. Range-of-motion exercises Pendulum  1. Stand near a wall or a surface that you can hold onto for balance. 2. Bend at the waist and let your left / right arm hang straight down. Use your other arm to support you. Keep your back straight and do not lock your knees. 3. Relax your left / right arm and shoulder muscles, and move your hips and your trunk so your left / right arm swings freely. Your arm should swing because of the motion of your body, not because you are using your arm or shoulder muscles. 4. Keep moving your hips and trunk so your arm swings in the following directions, as told by your health care provider: ? Side to side. ? Forward and backward. ? In clockwise and counterclockwise circles. 5. Continue each motion for __________ seconds, or for as long as told by your health care provider. 6. Slowly return to the starting position. Repeat __________ times. Complete this exercise __________ times a day. Shoulder flexion, standing  1. Stand and hold a broomstick, a cane, or a similar object. Place your hands a little more than shoulder width apart on the object. Your left / right hand should be palm up, and your other hand should be palm down. 2. Keep your elbow straight and your shoulder muscles relaxed. Push the stick up with your healthy arm to   raise your left / right arm in front of your body, and then over your head until you feel a stretch in your shoulder (flexion). ? Avoid shrugging your shoulder while you raise your arm. Keep your shoulder blade tucked down toward the middle of your back. 3. Hold for __________ seconds. 4. Slowly return to the starting position. Repeat __________ times. Complete this exercise __________ times a day. Shoulder abduction, standing 1. Stand and hold a broomstick,  a cane, or a similar object. Place your hands a little more than shoulder width apart on the object. Your left / right hand should be palm up, and your other hand should be palm down. 2. Keep your elbow straight and your shoulder muscles relaxed. Push the object across your body toward your left / right side. Raise your left / right arm to the side of your body (abduction) until you feel a stretch in your shoulder. ? Do not raise your arm above shoulder height unless your health care provider tells you to do that. ? If directed, raise your arm over your head. ? Avoid shrugging your shoulder while you raise your arm. Keep your shoulder blade tucked down toward the middle of your back. 3. Hold for __________ seconds. 4. Slowly return to the starting position. Repeat __________ times. Complete this exercise __________ times a day. Internal rotation  1. Place your left / right hand behind your back, palm up. 2. Use your other hand to dangle an exercise band, a towel, or a similar object over your shoulder. Grasp the band with your left / right hand so you are holding on to both ends. 3. Gently pull up on the band until you feel a stretch in the front of your left / right shoulder. The movement of your arm toward the center of your body is called internal rotation. ? Avoid shrugging your shoulder while you raise your arm. Keep your shoulder blade tucked down toward the middle of your back. 4. Hold for __________ seconds. 5. Release the stretch by letting go of the band and lowering your hands. Repeat __________ times. Complete this exercise __________ times a day. Strengthening exercises External rotation  1. Sit in a stable chair without armrests. 2. Secure an exercise band to a stable object at elbow height on your left / right side. 3. Place a soft object, such as a folded towel or a small pillow, between your left / right upper arm and your body to move your elbow about 4 inches (10 cm) away  from your side. 4. Hold the end of the exercise band so it is tight and there is no slack. 5. Keeping your elbow pressed against the soft object, slowly move your forearm out, away from your abdomen (external rotation). Keep your body steady so only your forearm moves. 6. Hold for __________ seconds. 7. Slowly return to the starting position. Repeat __________ times. Complete this exercise __________ times a day. Shoulder abduction  1. Sit in a stable chair without armrests, or stand up. 2. Hold a __________ weight in your left / right hand, or hold an exercise band with both hands. 3. Start with your arms straight down and your left / right palm facing in, toward your body. 4. Slowly lift your left / right hand out to your side (abduction). Do not lift your hand above shoulder height unless your health care provider tells you that this is safe. ? Keep your arms straight. ? Avoid shrugging your shoulder while you   do this movement. Keep your shoulder blade tucked down toward the middle of your back. 5. Hold for __________ seconds. 6. Slowly lower your arm, and return to the starting position. Repeat __________ times. Complete this exercise __________ times a day. Shoulder extension 1. Sit in a stable chair without armrests, or stand up. 2. Secure an exercise band to a stable object in front of you so it is at shoulder height. 3. Hold one end of the exercise band in each hand. Your palms should face each other. 4. Straighten your elbows and lift your hands up to shoulder height. 5. Step back, away from the secured end of the exercise band, until the band is tight and there is no slack. 6. Squeeze your shoulder blades together as you pull your hands down to the sides of your thighs (extension). Stop when your hands are straight down by your sides. Do not let your hands go behind your body. 7. Hold for __________ seconds. 8. Slowly return to the starting position. Repeat __________ times.  Complete this exercise __________ times a day. Shoulder row 1. Sit in a stable chair without armrests, or stand up. 2. Secure an exercise band to a stable object in front of you so it is at waist height. 3. Hold one end of the exercise band in each hand. Position your palms so that your thumbs are facing the ceiling (neutral position). 4. Bend each of your elbows to a 90-degree angle (right angle) and keep your upper arms at your sides. 5. Step back until the band is tight and there is no slack. 6. Slowly pull your elbows back behind you. 7. Hold for __________ seconds. 8. Slowly return to the starting position. Repeat __________ times. Complete this exercise __________ times a day. Shoulder press-ups  1. Sit in a stable chair that has armrests. Sit upright, with your feet flat on the floor. 2. Put your hands on the armrests so your elbows are bent and your fingers are pointing forward. Your hands should be about even with the sides of your body. 3. Push down on the armrests and use your arms to lift yourself off the chair. Straighten your elbows and lift yourself up as much as you comfortably can. ? Move your shoulder blades down, and avoid letting your shoulders move up toward your ears. ? Keep your feet on the ground. As you get stronger, your feet should support less of your body weight as you lift yourself up. 4. Hold for __________ seconds. 5. Slowly lower yourself back into the chair. Repeat __________ times. Complete this exercise __________ times a day. Wall push-ups  1. Stand so you are facing a stable wall. Your feet should be about one arm-length away from the wall. 2. Lean forward and place your palms on the wall at shoulder height. 3. Keep your feet flat on the floor as you bend your elbows and lean forward toward the wall. 4. Hold for __________ seconds. 5. Straighten your elbows to push yourself back to the starting position. Repeat __________ times. Complete this exercise  __________ times a day. This information is not intended to replace advice given to you by your health care provider. Make sure you discuss any questions you have with your health care provider. Document Released: 12/13/2004 Document Revised: 05/23/2018 Document Reviewed: 02/28/2018 [employer] Patient Education  2020 [employer]  

## 2018-10-03 ENCOUNTER — Other Ambulatory Visit: Payer: Self-pay | Admitting: Adult Health

## 2018-10-31 ENCOUNTER — Ambulatory Visit: Payer: PPO | Attending: Rheumatology | Admitting: Physical Therapy

## 2018-10-31 ENCOUNTER — Other Ambulatory Visit: Payer: Self-pay

## 2018-10-31 DIAGNOSIS — M25512 Pain in left shoulder: Secondary | ICD-10-CM | POA: Diagnosis not present

## 2018-10-31 DIAGNOSIS — M6281 Muscle weakness (generalized): Secondary | ICD-10-CM | POA: Insufficient documentation

## 2018-10-31 DIAGNOSIS — M545 Low back pain, unspecified: Secondary | ICD-10-CM

## 2018-10-31 DIAGNOSIS — M542 Cervicalgia: Secondary | ICD-10-CM

## 2018-10-31 DIAGNOSIS — G8929 Other chronic pain: Secondary | ICD-10-CM | POA: Insufficient documentation

## 2018-10-31 NOTE — Therapy (Signed)
[place], [place], [address] Phone: [phone]   Fax:  [phone]  Physical Therapy Evaluation  Patient Details  Name: Karen Hardin MRN: [id number] Date of Birth: 1965/06/14 Referring Provider (PT): Bo Merino MD   Encounter Date: 10/31/2018  PT End of Session - 10/31/18 0816    Visit Number  1    Number of Visits  13    Authorization Type  [employer]    PT Start Time  0801    PT Stop Time  0845    PT Time Calculation (min)  44 min    Activity Tolerance  Patient tolerated treatment well    Behavior During Therapy  [facility] for tasks assessed/performed       Past Medical History:  Diagnosis Date  . Anemia   . 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 Asthma 2002  . Depression    as a child, and stress recently  . Fibrocystic breast 2005  . LGSIL (low grade squamous intraepithelial lesion) on Pap smear 2007  . Libido, decreased 2010  . Migraines    migraines - otc med prn, then maxalt if needed  . Ovarian cyst   . 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 Tachycardia    per patient diagnosed in ER 2014, no problems currently  . Trichomonas   . Vaginosis 2008  . Vitamin D deficiency     Past Surgical History:  Procedure Laterality Date  . BREAST EXCISIONAL BIOPSY Left    benign  . BREAST SURGERY  1986   biopsy - benign  . CYSTOSCOPY  12/26/2012   Procedure: CYSTOSCOPY;  Surgeon: Ena Dawley, MD;  Location: [facility];  Service: Gynecology;;  . DILATION AND CURETTAGE OF UTERUS    . ganglion cyst removed from wrist     left  . LAPAROSCOPIC ASSISTED VAGINAL HYSTERECTOMY N/A 12/26/2012   Procedure: LAPAROSCOPIC ASSISTED VAGINAL HYSTERECTOMY Uterine Morcellation, Bilateral Salpingectomy, ;  Surgeon: Ena Dawley, MD;  Location: [facility];  Service: Gynecology;  Laterality: N/A;  . MYOMECTOMY     In [place] - laparotomy  . OOPHORECTOMY   2000   laparotomy in [place]  . SHOULDER ARTHROSCOPY W/ ROTATOR CUFF REPAIR  2006   right  . TONSILLECTOMY AND ADENOIDECTOMY    . TUBAL LIGATION  2000  . WISDOM TOOTH EXTRACTION     x 2 teeth    There were no vitals filed for this visit.   Subjective Assessment - 10/31/18 0808    Subjective  Pt arriving to therapy reporting pain in neck, low back, left elbows, left thumb and bilateral shoulder L>R. Pt reporting pain has been going on for years and the shoulder and R UE pain has progressed over the last 6 months. Pt with recent dx of fibromyalgia.    Pertinent History  Visually impaired, R shoulder surgery,    How long can you stand comfortably?  I can't stand in one place long, my feet hurt when I stand to do meal prep    How long can you walk comfortably?  "I don't walk very much"  30 minutes but "I feel it afterwards"    Patient Stated Goals  Stop hurting    Currently in Pain?  Yes    Pain Score  7    overall pain   Pain Location  Neck   neck and shoulder hurt the worse,  L shoulder   Pain Orientation  Left    Pain Descriptors / Indicators  Aching;Throbbing    Pain Type  Chronic pain    Pain Radiating Towards  down left arm into fingers and hand    Pain Onset  More than a month ago    Aggravating Factors   my job is repetitive and I'm lifting and reaching my left arm, I sit most of the day    Pain Relieving Factors  I have started using one of my husband back braces and it seems to help a little.         [facility] PT Assessment - 10/31/18 0001      Assessment   Medical Diagnosis  chronic multiple joint pain,     Referring Provider (PT)  Bo Merino MD    Onset Date/Surgical Date  --   worsening in L shoulder and neck over 6 months   Hand Dominance  Right    Next MD Visit  follow up with PCP    Prior Therapy  a long time ago after R shoulder surgery      Precautions   Precautions  None      Restrictions   Weight Bearing Restrictions  No      Balance Screen    Has the patient fallen in the past 6 months  No    Is the patient reluctant to leave their home because of a fear of falling?   No      Home Environment   Living Environment  Private residence    Living Arrangements  Spouse/significant other;Children    Type of [place] Access  --      Prior Function   Level of Independence  Independent    Vocation  Full time employment    Vocation Requirements  sitting, reaching with left UE    Leisure  wine and design      Cognition   Overall Cognitive Status  Within Functional Limits for tasks assessed      Observation/Other Assessments   Focus on Therapeutic Outcomes (FOTO)   deferred due to multiple joints      Posture/Postural Control   Posture/Postural Control  Postural limitations    Postural Limitations  Rounded Shoulders;Forward head;Increased lumbar lordosis;Increased thoracic kyphosis    Posture Comments  shoulder tilt to right about 15 degrees from horizontal      ROM / Strength   AROM / PROM / Strength  AROM;Strength      AROM   AROM Assessment Site  Shoulder;Cervical;Lumbar    Right/Left Shoulder  Right;Left    Right Shoulder Extension  42 Degrees    Right Shoulder Flexion  172 Degrees    Right Shoulder External Rotation  80 Degrees    Left Shoulder Extension  35 Degrees    Left Shoulder Flexion  135 Degrees    Left Shoulder ABduction  --    Left Shoulder External Rotation  62 Degrees    Cervical - Right Side Bend  40    Cervical - Left Side Bend  30    Cervical - Right Rotation  55    Cervical - Left Rotation  38    Lumbar Flexion  80    Lumbar Extension  28    Lumbar - Right Side Bend  28    Lumbar - Left Side Bend  25      Strength   Strength Assessment Site  Shoulder  Right/Left Shoulder  Right;Left    Right Shoulder Flexion  4+/5    Right Shoulder Extension  4+/5    Right Shoulder ABduction  4+/5    Left Shoulder Flexion  4-/5    Left Shoulder External Rotation  4-/5      Palpation   Palpation  comment  TTP cervical paraspinals L>R, left upper trap active trigger points noted, R upper trap and medial scapular boarder bilaterally      Special Tests   Other special tests  Thoracic outlet: negative                Objective measurements completed on examination: See above findings.              PT Education - 10/31/18 0814    Education Details  HEP, PT POC    Person(s) Educated  Patient    Methods  Explanation;Demonstration;Handout    Comprehension  Returned demonstration;Verbalized understanding          PT Long Term Goals - 10/31/18 0904      PT LONG TERM GOAL #1   Title  pt will be able to improve her L shoulder flexion to 170 degrees with no pain reported in order to improve functional mobility.    Baseline  L shoulder flexion: 132 degrees    Time  6    Period  Weeks    Status  New    Target Date  12/12/18      PT LONG TERM GOAL #2   Title  Pt will be able to left 10 pounds from counter height to shelf in upper cabinet with </= 2/10 pain in L shoulder.    Baseline  pain reported with lifting and pt reporting weakness and difficutly lifting objects    Time  6    Period  Weeks    Status  New    Target Date  12/12/18      PT LONG TERM GOAL #3   Title  pt will be able to improve cervical rotation to bilaterally to 65 degrees in order to improve functional mobility.    Baseline  see flow sheets    Time  6    Period  Weeks    Status  New    Target Date  12/12/18      PT LONG TERM GOAL #4   Title  pt will improve her L shoulder strength to >/=  4+/5 in order to improve work functions.    Baseline  3+/5 to 4-/5    Time  6    Period  Weeks    Status  New    Target Date  12/12/18             Plan - 10/31/18 [id number]    Clinical Impression Statement  Pt presents for PT evaluation of multiple joint problems with pain associated maninly in the L shoulder and neck. Pt also reporting low back pain, feet pain, R shoulder pain. Pt with obvious L  shoulder elevation compared to R with tightness and trigger points noted in bilateral traps L>R. Pt with tenderness to palpation along cervical left paraspinals and L upper trap. Pt also reporting radiation at times down L UE into thumb. Pt with mild weakness of 4-/5 in L shoulder and decreased AROM when compared to R. Skilled PT needed to address pt's impairments with the below interventions.    Personal Factors and Comorbidities  Comorbidity 3+    Comorbidities  Stargardt's  Disease (legally blind), Fibromyalgia, depression, DM, anemina, fibrocystic breast, DJD,    Examination-Activity Limitations  Carry;Lift;Sit;Stand    Examination-Participation Restrictions  Cleaning;Laundry;Community Activity    Stability/Clinical Decision Making  Evolving/Moderate complexity    Clinical Decision Making  Moderate    Rehab Potential  Good    PT Frequency  2x / week    PT Duration  6 weeks    PT Treatment/Interventions  ADLs/Self Care Home Management;Ultrasound;Cryotherapy;Electrical Stimulation;Moist Heat;Traction;Functional mobility training;Therapeutic activities;Therapeutic exercise;Balance training;Patient/family education;Neuromuscular re-education;Manual techniques;Passive range of motion;Dry needling;Taping;Joint Manipulations    PT Next Visit Plan  shoulder ROM, strengthening, postural corrections, cervical stretching, Manual STW to L upper trap, modalities as needed    PT Home Exercise Plan  Access Code: [email or web address]    Consulted and Agree with Plan of Care  Patient       Patient will benefit from skilled therapeutic intervention in order to improve the following deficits and impairments:  Pain, Postural dysfunction, Decreased strength, Impaired flexibility, Decreased range of motion  Visit Diagnosis: Chronic left shoulder pain  Cervicalgia  Muscle weakness (generalized)  Chronic midline low back pain without sciatica     Problem List Patient Active Problem List   Diagnosis Date Noted  .  Vitamin D deficiency 08/06/2018  . Vitamin B 12 deficiency 08/06/2018  . Iron deficiency anemia 08/06/2018  . Diabetes type 2, uncontrolled ([place]) 09/22/2015  . Asthma, chronic 05/11/2014  . Legal blindness 05/11/2014  . Chronic back pain 05/11/2014  . Migraines 05/11/2014  . Stargardt's disease   . Menorrhagia 09/11/2011    Oretha Caprice, PT 10/31/2018, 9:12 AM  [facility] [address] [place], [place], [address] Phone: [phone]   Fax:  [phone]  Name: HART WRISLEY MRN: [id number] Date of Birth: [birth year]

## 2018-11-07 LAB — HM DIABETES EYE EXAM

## 2018-11-19 ENCOUNTER — Other Ambulatory Visit: Payer: Self-pay | Admitting: Obstetrics & Gynecology

## 2018-11-19 ENCOUNTER — Ambulatory Visit (HOSPITAL_COMMUNITY)
Admission: EM | Admit: 2018-11-19 | Discharge: 2018-11-19 | Disposition: A | Discharge status: Home / Self Care | Payer: PPO | Attending: Urgent Care | Admitting: Urgent Care

## 2018-11-19 ENCOUNTER — Other Ambulatory Visit: Payer: Self-pay

## 2018-11-19 ENCOUNTER — Ambulatory Visit (INDEPENDENT_AMBULATORY_CARE_PROVIDER_SITE_OTHER): Payer: PPO

## 2018-11-19 ENCOUNTER — Encounter (HOSPITAL_COMMUNITY): Payer: Self-pay

## 2018-11-19 DIAGNOSIS — G8929 Other chronic pain: Secondary | ICD-10-CM | POA: Diagnosis not present

## 2018-11-19 DIAGNOSIS — Z1231 Encounter for screening mammogram for malignant neoplasm of breast: Secondary | ICD-10-CM

## 2018-11-19 DIAGNOSIS — M797 Fibromyalgia: Secondary | ICD-10-CM | POA: Diagnosis not present

## 2018-11-19 DIAGNOSIS — G894 Chronic pain syndrome: Secondary | ICD-10-CM

## 2018-11-19 DIAGNOSIS — M549 Dorsalgia, unspecified: Secondary | ICD-10-CM | POA: Diagnosis not present

## 2018-11-19 DIAGNOSIS — Z8739 Personal history of other diseases of the musculoskeletal system and connective tissue: Secondary | ICD-10-CM

## 2018-11-19 DIAGNOSIS — M25512 Pain in left shoulder: Secondary | ICD-10-CM

## 2018-11-19 MED ORDER — DICLOFENAC SODIUM 1 % TD GEL: 2.0000 g | Freq: Three times a day (TID) | TRANSDERMAL | 0 refills | Status: DC | PRN

## 2018-11-19 NOTE — ED Notes (Signed)
Bed: UC05 Expected date: 11/19/18 Expected time: 6:46 PM Means of arrival:  Comments: For APPT

## 2018-11-19 NOTE — ED Triage Notes (Signed)
Patient presents to Urgent Care with complaints of left shoulder pain since a month ago. Patient reports she was just diagnosed with fibromyalgia. Pt endorses left hand pain that is chronic and left sided back pain. Pt requesting x-ray for "peace of mind".

## 2018-11-19 NOTE — ED Provider Notes (Signed)
MRN: [id number] DOB: [birth year]  Subjective:   Karen Hardin is a 53 y.o. female presenting for 1 month history of persistent deep intermittent left shoulder pain.  Patient is very concerned about getting an x-ray, states that she wants peace of mind.  She also has chronic back pain and chronic hand pain which she is requesting x-rays for.  States that she had a positive PPD about 20 years ago and tries to get a yearly chest x-ray which she would also like done today.  Patient does have a history of fibromyalgia, is working with a rheumatologist on this.  She has had imaging for her back that has shown degenerative disc disease.  Patient states that she has been managed with Flexeril which is difficult for patient given that it does cause some sleepiness.  Patient does have a job where she has to do a lot of repetitive motions with her arms and is worried about this as well given that she has had to have surgery for her right shoulder.  Denies falls, trauma that precipitated her left shoulder pain.  No current facility-administered medications for this encounter.   Current Outpatient Medications:  .  amitriptyline (ELAVIL) 50 MG tablet, Take 1 tablet (50 mg total) by mouth at bedtime., Disp: 90 tablet, Rfl: 0 .  Ascorbic Acid (VITAMIN C PO), Take by mouth., Disp: , Rfl:  .  Cholecalciferol (VITAMIN D3 PO), Take 3,000 Units by mouth daily., Disp: , Rfl:  .  cyclobenzaprine (FLEXERIL) 10 MG tablet, TAKE 1 TABLET BY MOUTH EVERY 8 HOURS AS NEEDED FOR MUSCLE SPASM, Disp: 30 tablet, Rfl: 1 .  Ferrous Sulfate (IRON PO), Take by mouth., Disp: , Rfl:  .  glipiZIDE (GLUCOTROL XL) 5 MG 24 hr tablet, Take 1 tablet (5 mg total) by mouth daily with breakfast. (Patient taking differently: Take 5 mg by mouth daily with breakfast. As needed), Disp: 90 tablet, Rfl: 0 .  lisinopril (ZESTRIL) 2.5 MG tablet, Take 1 tablet (2.5 mg total) by mouth daily., Disp: 90 tablet, Rfl: 3 .  rizatriptan (MAXALT) 5 MG tablet,  Take 1 tablet (5 mg total) by mouth as needed., Disp: 10 tablet, Rfl: 3 .  vitamin B-12 (CYANOCOBALAMIN) 1000 MCG tablet, Take 1,000 mcg by mouth daily., Disp: , Rfl:  .  albuterol (PROVENTIL HFA;VENTOLIN HFA) 108 (90 Base) MCG/ACT inhaler, Inhale 2 puffs into the lungs every 4 (four) hours as needed for wheezing. Dispense with aerochamber (Patient not taking: Reported on 08/29/2018), Disp: 1 Inhaler, Rfl: 0 .  Blood Glucose Monitoring Suppl ([place]) w/Device KIT, Test once daily. Dx: E11.9, Disp: 1 each, Rfl: 0 .  glucose blood test strip, Use as instructed, Disp: 100 each, Rfl: 12 .  ONETOUCH DELICA LANCETS 79Y MISC, USE AS DIRECTED, Disp: 100 each, Rfl: 1 .  OVER THE COUNTER MEDICATION, Apply 1 application topically as needed. Theragesic Cream.  Applying to neck & back., Disp: , Rfl:    No Known Allergies  Past Medical History:  Diagnosis Date  . Anemia   . 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 Asthma 2002  . Depression    as a child, and stress recently  . Fibrocystic breast 2005  . LGSIL (low grade squamous intraepithelial lesion) on Pap smear 2007  . Libido, decreased 2010  . Migraines    migraines - otc med prn, then maxalt if needed  . Ovarian cyst   . Stargardt's disease 1986  Patient is legally blind  . Tachycardia    per patient diagnosed in ER 2014, no problems currently  . Trichomonas   . Vaginosis 2008  . Vitamin D deficiency      Past Surgical History:  Procedure Laterality Date  . BREAST EXCISIONAL BIOPSY Left    benign  . BREAST SURGERY  1986   biopsy - benign  . CYSTOSCOPY  12/26/2012   Procedure: CYSTOSCOPY;  Surgeon: Ena Dawley, MD;  Location: [facility];  Service: Gynecology;;  . DILATION AND CURETTAGE OF UTERUS    . ganglion cyst removed from wrist     left  . LAPAROSCOPIC ASSISTED VAGINAL HYSTERECTOMY N/A 12/26/2012   Procedure: LAPAROSCOPIC ASSISTED VAGINAL HYSTERECTOMY Uterine Morcellation, Bilateral  Salpingectomy, ;  Surgeon: Ena Dawley, MD;  Location: [facility];  Service: Gynecology;  Laterality: N/A;  . MYOMECTOMY     In [place] - laparotomy  . OOPHORECTOMY  2000   laparotomy in [place]  . SHOULDER ARTHROSCOPY W/ ROTATOR CUFF REPAIR  2006   right  . TONSILLECTOMY AND ADENOIDECTOMY    . TUBAL LIGATION  2000  . WISDOM TOOTH EXTRACTION     x 2 teeth    ROS  Objective:   Vitals: BP (!) 155/81 (BP Location: Right Arm)   Pulse 95   Temp (!) 97.3 F (36.3 C) (Temporal)   Resp 14   LMP 12/14/2012   SpO2 100%   Physical Exam Constitutional:      General: She is not in acute distress.    Appearance: Normal appearance. She is well-developed. She is obese. She is not ill-appearing, toxic-appearing or diaphoretic.  HENT:     Head: Normocephalic and atraumatic.     Nose: Nose normal.     Mouth/Throat:     Mouth: Mucous membranes are moist.     Pharynx: Oropharynx is clear.  Eyes:     General: No scleral icterus.    Extraocular Movements: Extraocular movements intact.     Pupils: Pupils are equal, round, and reactive to light.  Cardiovascular:     Rate and Rhythm: Normal rate and regular rhythm.     Pulses: Normal pulses.     Heart sounds: Normal heart sounds. No murmur. No friction rub. No gallop.   Pulmonary:     Effort: Pulmonary effort is normal. No respiratory distress.     Breath sounds: Normal breath sounds. No stridor. No wheezing, rhonchi or rales.  Musculoskeletal:     Left shoulder: She exhibits decreased range of motion and tenderness (about her deltoid, AC joint).  Skin:    General: Skin is warm and dry.     Findings: No rash.  Neurological:     General: No focal deficit present.     Mental Status: She is alert and oriented to person, place, and time.  Psychiatric:        Mood and Affect: Mood is anxious. Mood is not elated. Affect is flat. Affect is not labile, blunt, angry, tearful or inappropriate.        Speech: She is communicative. Speech is  not rapid and pressured, delayed, slurred or tangential.        Behavior: Behavior normal.        Thought Content: Thought content normal.    Dg Shoulder Left  Result Date: 11/19/2018 CLINICAL DATA:  Left shoulder pain for 2 months EXAM: LEFT SHOULDER - 2+ VIEW COMPARISON:  None. FINDINGS: There is no evidence of fracture or dislocation. There is no  evidence of arthropathy or other focal bone abnormality. Soft tissues are unremarkable. IMPRESSION: Negative. Electronically Signed   By: Donavan Foil M.D.   On: 11/19/2018 19:50    Assessment and Plan :   1. Fibromyalgia   2. Acute pain of left shoulder   3. Chronic pain syndrome   4. Chronic back pain, unspecified back location, unspecified back pain laterality   5. H/O degenerative disc disease     We will use topical therapy as patient has been previously instructed not to take any kind of NSAID.  Prescription for diclofenac gel sent to her pharmacy electronically.  Recommend that she also use muscle relaxant as previously prescribed by her rheumatologist.  Monitor blood pressure, recheck with PCP if it remains elevated.  Counseled patient on potential for adverse effects with medications prescribed/recommended today, ER and return-to-clinic precautions discussed, patient verbalized understanding.    Jaynee Eagles, [place] 11/26/18 [phone]

## 2018-11-20 ENCOUNTER — Encounter: Payer: Self-pay | Admitting: Family Medicine

## 2018-11-27 DIAGNOSIS — N644 Mastodynia: Secondary | ICD-10-CM | POA: Diagnosis not present

## 2018-11-27 DIAGNOSIS — Z01411 Encounter for gynecological examination (general) (routine) with abnormal findings: Secondary | ICD-10-CM | POA: Diagnosis not present

## 2018-11-27 DIAGNOSIS — Z6832 Body mass index (BMI) 32.0-32.9, adult: Secondary | ICD-10-CM | POA: Diagnosis not present

## 2018-11-27 DIAGNOSIS — N951 Menopausal and female climacteric states: Secondary | ICD-10-CM | POA: Diagnosis not present

## 2018-12-01 DIAGNOSIS — M79642 Pain in left hand: Secondary | ICD-10-CM | POA: Diagnosis not present

## 2018-12-01 DIAGNOSIS — M542 Cervicalgia: Secondary | ICD-10-CM | POA: Diagnosis not present

## 2018-12-02 ENCOUNTER — Other Ambulatory Visit: Payer: Self-pay | Admitting: Obstetrics & Gynecology

## 2018-12-02 DIAGNOSIS — N644 Mastodynia: Secondary | ICD-10-CM

## 2018-12-10 ENCOUNTER — Other Ambulatory Visit: Payer: PPO

## 2018-12-17 ENCOUNTER — Other Ambulatory Visit: Payer: Self-pay | Admitting: Obstetrics & Gynecology

## 2018-12-17 ENCOUNTER — Ambulatory Visit
Admission: RE | Admit: 2018-12-17 | Discharge: 2018-12-17 | Disposition: A | Discharge status: Home / Self Care | Payer: PPO | Source: Ambulatory Visit | Attending: Obstetrics & Gynecology | Admitting: Obstetrics & Gynecology

## 2018-12-17 ENCOUNTER — Ambulatory Visit: Admission: RE | Admit: 2018-12-17 | Payer: PPO | Source: Ambulatory Visit

## 2018-12-17 ENCOUNTER — Other Ambulatory Visit: Payer: Self-pay

## 2018-12-17 DIAGNOSIS — R922 Inconclusive mammogram: Secondary | ICD-10-CM | POA: Diagnosis not present

## 2018-12-17 DIAGNOSIS — N644 Mastodynia: Secondary | ICD-10-CM

## 2019-01-07 ENCOUNTER — Other Ambulatory Visit: Payer: Self-pay | Admitting: Orthopedic Surgery

## 2019-01-07 DIAGNOSIS — M1812 Unilateral primary osteoarthritis of first carpometacarpal joint, left hand: Secondary | ICD-10-CM | POA: Diagnosis not present

## 2019-01-07 DIAGNOSIS — S46012A Strain of muscle(s) and tendon(s) of the rotator cuff of left shoulder, initial encounter: Secondary | ICD-10-CM | POA: Diagnosis not present

## 2019-01-07 DIAGNOSIS — M25512 Pain in left shoulder: Secondary | ICD-10-CM

## 2019-01-30 ENCOUNTER — Other Ambulatory Visit: Payer: PPO

## 2019-02-13 HISTORY — PX: SHOULDER ARTHROSCOPY: SHX128

## 2019-02-25 ENCOUNTER — Ambulatory Visit
Admission: RE | Admit: 2019-02-25 | Discharge: 2019-02-25 | Disposition: A | Discharge status: Home / Self Care | Payer: PPO | Source: Ambulatory Visit | Attending: Orthopedic Surgery | Admitting: Orthopedic Surgery

## 2019-02-25 DIAGNOSIS — M25512 Pain in left shoulder: Secondary | ICD-10-CM

## 2019-02-25 DIAGNOSIS — S43402A Unspecified sprain of left shoulder joint, initial encounter: Secondary | ICD-10-CM | POA: Diagnosis not present

## 2019-02-25 DIAGNOSIS — S46012A Strain of muscle(s) and tendon(s) of the rotator cuff of left shoulder, initial encounter: Secondary | ICD-10-CM | POA: Diagnosis not present

## 2019-03-04 DIAGNOSIS — M19012 Primary osteoarthritis, left shoulder: Secondary | ICD-10-CM | POA: Diagnosis not present

## 2019-04-22 ENCOUNTER — Other Ambulatory Visit: Payer: Self-pay

## 2019-04-23 ENCOUNTER — Other Ambulatory Visit: Payer: Self-pay | Admitting: Family Medicine

## 2019-04-23 ENCOUNTER — Encounter: Payer: Self-pay | Admitting: Adult Health

## 2019-04-23 ENCOUNTER — Ambulatory Visit (INDEPENDENT_AMBULATORY_CARE_PROVIDER_SITE_OTHER): Payer: PPO | Admitting: Adult Health

## 2019-04-23 VITALS — BP 116/76 | Temp 97.3°F | Wt 224.0 lb

## 2019-04-23 DIAGNOSIS — Z760 Encounter for issue of repeat prescription: Secondary | ICD-10-CM | POA: Diagnosis not present

## 2019-04-23 DIAGNOSIS — E1165 Type 2 diabetes mellitus with hyperglycemia: Secondary | ICD-10-CM

## 2019-04-23 DIAGNOSIS — M7918 Myalgia, other site: Secondary | ICD-10-CM

## 2019-04-23 DIAGNOSIS — Z01818 Encounter for other preprocedural examination: Secondary | ICD-10-CM | POA: Diagnosis not present

## 2019-04-23 DIAGNOSIS — M2550 Pain in unspecified joint: Secondary | ICD-10-CM | POA: Diagnosis not present

## 2019-04-23 LAB — CBC WITH DIFFERENTIAL/PLATELET
Basophils Absolute: 0.1 10*3/uL (ref 0.0–0.1)
Basophils Relative: 1.1 % (ref 0.0–3.0)
Eosinophils Absolute: 0.1 10*3/uL (ref 0.0–0.7)
Eosinophils Relative: 1.3 % (ref 0.0–5.0)
HCT: 40.7 % (ref 36.0–46.0)
Hemoglobin: 13.5 g/dL (ref 12.0–15.0)
Lymphocytes Relative: 36.3 % (ref 12.0–46.0)
Lymphs Abs: 2.3 10*3/uL (ref 0.7–4.0)
MCHC: 33.2 g/dL (ref 30.0–36.0)
MCV: 85.9 fl (ref 78.0–100.0)
Monocytes Absolute: 0.6 10*3/uL (ref 0.1–1.0)
Monocytes Relative: 10 % (ref 3.0–12.0)
Neutro Abs: 3.3 10*3/uL (ref 1.4–7.7)
Neutrophils Relative %: 51.3 % (ref 43.0–77.0)
Platelets: 279 10*3/uL (ref 150.0–400.0)
RBC: 4.74 Mil/uL (ref 3.87–5.11)
RDW: 13.3 % (ref 11.5–15.5)
WBC: 6.5 10*3/uL (ref 4.0–10.5)

## 2019-04-23 LAB — BASIC METABOLIC PANEL
BUN: 10 mg/dL (ref 6–23)
CO2: 26 mEq/L (ref 19–32)
Calcium: 9.7 mg/dL (ref 8.4–10.5)
Chloride: 105 mEq/L (ref 96–112)
Creatinine, Ser: 0.75 mg/dL (ref 0.40–1.20)
GFR: 97.24 mL/min (ref >60.00)
Glucose, Bld: 135 mg/dL — ABNORMAL HIGH (ref 70–99)
Potassium: 3.8 mEq/L (ref 3.5–5.1)
Sodium: 139 mEq/L (ref 135–145)

## 2019-04-23 LAB — HEMOGLOBIN A1C: Hgb A1c MFr Bld: 6.9 % — ABNORMAL HIGH (ref 4.6–6.5)

## 2019-04-23 LAB — PROTIME-INR
INR: 1.1 ratio — ABNORMAL HIGH (ref 0.8–1.0)
Prothrombin Time: 12.6 s (ref 9.6–13.1)

## 2019-04-23 MED ORDER — CYCLOBENZAPRINE HCL 10 MG PO TABS: ORAL_TABLET | ORAL | 1 refills | Status: DC

## 2019-04-23 MED ORDER — ONETOUCH DELICA LANCETS 33G MISC: 3 refills | Status: DC

## 2019-04-23 MED ORDER — DULOXETINE HCL 30 MG PO CPEP: 30.0000 mg | ORAL_CAPSULE | Freq: Every day | ORAL | 0 refills | Status: DC

## 2019-04-23 MED ORDER — ALBUTEROL SULFATE HFA 108 (90 BASE) MCG/ACT IN AERS: 2.0000 | INHALATION_SPRAY | RESPIRATORY_TRACT | 1 refills | Status: DC | PRN

## 2019-04-23 MED ORDER — GLIPIZIDE ER 2.5 MG PO TB24: 2.5000 mg | ORAL_TABLET | Freq: Every day | ORAL | 0 refills | Status: DC

## 2019-04-23 MED ORDER — GLUCOSE BLOOD VI STRP: ORAL_STRIP | 3 refills | Status: DC

## 2019-04-23 NOTE — Progress Notes (Signed)
Subjective:    Patient ID: Karen Hardin, female    DOB: 03/24/1965, 54 y.o.   MRN: [id number]  HPI 54 year old female who  has a past medical history of Anemia, Arthritis, Asthma (2002), Depression, Fibrocystic breast (2005), LGSIL (low grade squamous intraepithelial lesion) on Pap smear (2007), Libido, decreased (2010), Migraines, Ovarian cyst, Stargardt's disease (1986), Tachycardia, Trichomonas, Vaginosis (2008), and Vitamin D deficiency.  She presents to the office today for surgical clearance.  She has a pending surgery date with Raliegh Ip for left shoulder rotator cuff repair.  He does have a history of diabetes, her last A1c was 6.5 in June 2020.  At this time she was switched from Metformin to glipizide 5 mg extended release.  She has since stopped that medication as she reports that she had multiple episodes of hypoglycemia with her lowest blood sugar being as low as 54.  She failed to inform us that she had stopped this medication.  She has not been checking her blood sugars on a continuous basis as she ran out of strips and testing supplies.  She reports the last time she checked it was about 2 weeks ago and at that time her blood sugar was 187.  Lab Results  Component Value Date   HGBA1C 6.5 08/06/2018   Additionally, she was seen by rheumatology back in August 2020 for myofascial pain and arthralgia.  She has a lot of pain and discomfort in bilateral shoulders as well as generalized pain in the cervical, thoracic, lumbar spine as well as the cost chondral joints and rib cage.  He was advised to follow-up with her PCP for future treatment.  Since she was already prescribed Flexeril rheumatology thought that she may benefit from Cymbalta.  She was referred to physical therapy but due to her work schedule could not make time to go.  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   Asthma 2002   Depression    as a child, and stress recently   Fibrocystic breast 2005   LGSIL (low grade squamous intraepithelial lesion) on Pap smear 2007   Libido, decreased 2010   Migraines    migraines - otc med prn, then maxalt if needed   Ovarian cyst    Stargardt's disease 1986   Patient is legally blind   Tachycardia    per patient diagnosed in ER 2014, no problems currently   Trichomonas    Vaginosis 2008   Vitamin D deficiency     Social History   Socioeconomic History   Marital status: Married    Spouse name: Not on file   Number of children: Not on file   Years of education: Not on file   Highest education level: Not on file  Occupational History   Not on file  Tobacco Use   Smoking status: Former Smoker    Packs/day: 0.10    Years: 20.00    Pack years: 2.00    Types: Cigarettes    Start date: 02/28/2015   Smokeless tobacco: Never Used   Tobacco comment: patient uses nicotine gum. Smoked for over 20 years. Quit in 02/2015.  Substance and Sexual Activity   Alcohol use: No  Alcohol/week: 0.0 standard drinks   Drug use: No   Sexual activity: Yes    Birth control/protection: Surgical, Condom  Other Topics Concern   Not on file  Social History Narrative   Is on disability    Married for 21 years    Three boys ( all three live locally)       She likes to hang out with friends. 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    Social Determinants of Health   Financial Resource Strain:    Difficulty of Paying Living Expenses:   Food Insecurity:    Worried About Charity fundraiser in the Last Year:    Arboriculturist in the Last Year:   Transportation Needs:    Film/video editor (Medical):    Lack of Transportation (Non-Medical):     Physical Activity:    Days of Exercise per Week:    Minutes of Exercise per Session:   Stress:    Feeling of Stress :   Social Connections:    Frequency of Communication with Friends and Family:    Frequency of Social Gatherings with Friends and Family:    Attends Religious Services:    Active Member of Clubs or Organizations:    Attends Music therapist:    Marital Status:   Intimate Partner Violence:    Fear of Current or Ex-Partner:    Emotionally Abused:    Physically Abused:    Sexually Abused:     Past Surgical History:  Procedure Laterality Date   BREAST EXCISIONAL BIOPSY Left    benign   BREAST SURGERY  1986   biopsy - benign   CYSTOSCOPY  12/26/2012   Procedure: CYSTOSCOPY;  Surgeon: Ena Dawley, MD;  Location: [facility];  Service: Gynecology;;   DILATION AND CURETTAGE OF UTERUS     ganglion cyst removed from wrist     left   LAPAROSCOPIC ASSISTED VAGINAL HYSTERECTOMY N/A 12/26/2012   Procedure: LAPAROSCOPIC ASSISTED VAGINAL HYSTERECTOMY Uterine Morcellation, Bilateral Salpingectomy, ;  Surgeon: Ena Dawley, MD;  Location: [facility];  Service: Gynecology;  Laterality: N/A;   MYOMECTOMY     In [place] - laparotomy   OOPHORECTOMY  2000   laparotomy in [place]   SHOULDER ARTHROSCOPY W/ ROTATOR CUFF REPAIR  2006   right   TONSILLECTOMY AND ADENOIDECTOMY     TUBAL LIGATION  2000   WISDOM TOOTH EXTRACTION     x 2 teeth    Family History  Problem Relation Age of Onset   Arthritis Mother    Diabetes Mother    Hypertension Mother    Pulmonary embolism Mother        Cardiac arrest    Heart disease Father    Heart attack Father    Mesothelioma Father    Hyperlipidemia Maternal Grandmother    Breast cancer Maternal Grandmother    Colitis Paternal Aunt        in 39's   Heart attack Paternal Aunt    Heart attack Paternal Uncle    Stroke Paternal Aunt    Breast cancer Maternal Aunt     No Known  Allergies  Current Outpatient Medications on File Prior to Visit  Medication Sig Dispense Refill   amitriptyline (ELAVIL) 50 MG tablet Take 1 tablet (50 mg total) by mouth at bedtime. 90 tablet 0   Ascorbic Acid (VITAMIN C PO) Take by mouth.     Blood Glucose Monitoring  Suppl ([place]aughn) w/Device KIT Test once daily. Dx: E11.9 1 each 0   Cholecalciferol (VITAMIN D3 PO) Take 3,000 Units by mouth daily.     cyclobenzaprine (FLEXERIL) 10 MG tablet TAKE 1 TABLET BY MOUTH EVERY 8 HOURS AS NEEDED FOR MUSCLE SPASM 30 tablet 1   Ferrous Sulfate (IRON PO) Take by mouth.     glucose blood test strip Use as instructed 100 each 12   lisinopril (ZESTRIL) 2.5 MG tablet Take 1 tablet (2.5 mg total) by mouth daily. 90 tablet 3   ONETOUCH DELICA LANCETS 97L MISC USE AS DIRECTED 100 each 1   OVER THE COUNTER MEDICATION Apply 1 application topically as needed. Theragesic Cream.  Applying to neck & back.     rizatriptan (MAXALT) 5 MG tablet Take 1 tablet (5 mg total) by mouth as needed. 10 tablet 3   vitamin B-12 (CYANOCOBALAMIN) 1000 MCG tablet Take 1,000 mcg by mouth daily.     albuterol (PROVENTIL HFA;VENTOLIN HFA) 108 (90 Base) MCG/ACT inhaler Inhale 2 puffs into the lungs every 4 (four) hours as needed for wheezing. Dispense with aerochamber (Patient not taking: Reported on 08/29/2018) 1 Inhaler 0   glipiZIDE (GLUCOTROL XL) 5 MG 24 hr tablet Take 1 tablet (5 mg total) by mouth daily with breakfast. (Patient not taking: Reported on 04/23/2019) 90 tablet 0   No current facility-administered medications on file prior to visit.    BP 116/76    Temp (!) 97.3 F (36.3 C)    Wt 224 lb (101.6 kg)    LMP 12/14/2012    BMI 34.06 kg/m       Objective:   Physical Exam Vitals and nursing note reviewed.  Constitutional:      Appearance: Normal appearance. She is obese.  HENT:     Head: Normocephalic and atraumatic.     Right Ear: Tympanic membrane, ear canal and external ear normal.  There is no impacted cerumen.     Left Ear: Tympanic membrane, ear canal and external ear normal.     Nose: Nose normal. No congestion or rhinorrhea.     Mouth/Throat:     Mouth: Mucous membranes are moist.     Pharynx: Oropharynx is clear.  Cardiovascular:     Rate and Rhythm: Normal rate and regular rhythm.     Pulses: Normal pulses.     Heart sounds: Normal heart sounds.  Pulmonary:     Effort: Pulmonary effort is normal.     Breath sounds: Normal breath sounds.  Abdominal:     General: Abdomen is flat. Bowel sounds are normal. There is no distension.     Palpations: Abdomen is soft.     Tenderness: There is no abdominal tenderness.  Musculoskeletal:        General: Normal range of motion.     Right lower leg: No edema.     Left lower leg: No edema.  Skin:    General: Skin is warm and dry.     Capillary Refill: Capillary refill takes less than 2 seconds.  Neurological:     General: No focal deficit present.     Mental Status: She is alert and oriented to person, place, and time.  Psychiatric:        Mood and Affect: Mood normal.        Behavior: Behavior normal.        Thought Content: Thought content normal.        Judgment: Judgment normal.  Assessment & Plan:  1. Pre-operative clearance -Preoperative clearance will be faxed back to Raliegh Ip once labs have resulted - EKG 12-Lead- NSR, Rate 87 - CBC with Differential/Platelet - Basic Metabolic Panel - Hemoglobin A1c - Protime-INR  2. Uncontrolled type 2 diabetes mellitus with hyperglycemia ([place]) -Likely need to place on glipizide 2.5 mg extended release.  She was advised that if she has symptoms of hypoglycemia that she needs to notify me - OneTouch Delica Lancets 16X MISC; Check blood sugars twice a day  Dispense: 200 each; Refill: 3 - glucose blood test strip; Use as instructed  Dispense: 200 each; Refill: 3 - DULoxetine (CYMBALTA) 30 MG capsule; Take 1 capsule (30 mg total) by mouth daily.  Dispense:  30 capsule; Refill: 0  3. Myofascial pain syndrome -We will trial her on Cymbalta 30 mg daily.  Follow-up in 30 days or sooner if needed - cyclobenzaprine (FLEXERIL) 10 MG tablet; TAKE 1 TABLET BY MOUTH EVERY 8 HOURS AS NEEDED FOR MUSCLE SPASM  Dispense: 30 tablet; Refill: 1 - DULoxetine (CYMBALTA) 30 MG capsule; Take 1 capsule (30 mg total) by mouth daily.  Dispense: 30 capsule; Refill: 0  4. Polyarthralgia  - cyclobenzaprine (FLEXERIL) 10 MG tablet; TAKE 1 TABLET BY MOUTH EVERY 8 HOURS AS NEEDED FOR MUSCLE SPASM  Dispense: 30 tablet; Refill: 1 - DULoxetine (CYMBALTA) 30 MG capsule; Take 1 capsule (30 mg total) by mouth daily.  Dispense: 30 capsule; Refill: 0  5. Medication refill  - albuterol (VENTOLIN HFA) 108 (90 Base) MCG/ACT inhaler; Inhale 2 puffs into the lungs every 4 (four) hours as needed for wheezing. Dispense with aerochamber  Dispense: 6.7 g; Refill: 1 - cyclobenzaprine (FLEXERIL) 10 MG tablet; TAKE 1 TABLET BY MOUTH EVERY 8 HOURS AS NEEDED FOR MUSCLE SPASM  Dispense: 30 tablet; Refill: 1 - OneTouch Delica Lancets 09U MISC; Check blood sugars twice a day  Dispense: 200 each; Refill: 3 - glucose blood test strip; Use as instructed  Dispense: 200 each; Refill: [place]

## 2019-04-30 ENCOUNTER — Telehealth: Payer: Self-pay | Admitting: Adult Health

## 2019-04-30 ENCOUNTER — Telehealth: Payer: Self-pay | Admitting: Family Medicine

## 2019-04-30 MED ORDER — ALBUTEROL SULFATE HFA 108 (90 BASE) MCG/ACT IN AERS: 2.0000 | INHALATION_SPRAY | RESPIRATORY_TRACT | 0 refills | Status: DC | PRN

## 2019-04-30 NOTE — Telephone Encounter (Signed)
New rx sent to the pharmacy.  Pt notified by telephone.

## 2019-04-30 NOTE — Telephone Encounter (Signed)
Ok to change to [facility]

## 2019-04-30 NOTE — Telephone Encounter (Signed)
Determination received.  Pt will have to try ProAir.  Will forward to [facility] for authorization to change prescription.

## 2019-04-30 NOTE — Telephone Encounter (Signed)
PA submitted to CoverMyMeds.  Waiting on determination.   Key: [id number] - PA Case ID: [id number] - Rx #: [id number]

## 2019-04-30 NOTE — Telephone Encounter (Signed)
Sukhi with [employer], is requesting to speak to you regarding a prior authorization that was sent this morning. Thanks  Reference [id number]

## 2019-04-30 NOTE — Telephone Encounter (Signed)
Spoke to [facility] when I returned the call.  Pt will have to try and fail ProAir.  Will send a message to [facility].

## 2019-04-30 NOTE — Addendum Note (Signed)
Addended by: Miles Costain T on: 04/30/2019 10:22 AM   Modules accepted: Orders

## 2019-05-07 ENCOUNTER — Other Ambulatory Visit: Payer: Self-pay | Admitting: Family Medicine

## 2019-05-07 MED ORDER — PROAIR HFA 108 (90 BASE) MCG/ACT IN AERS: 2.0000 | INHALATION_SPRAY | RESPIRATORY_TRACT | 2 refills | Status: DC | PRN

## 2019-05-14 DIAGNOSIS — M7522 Bicipital tendinitis, left shoulder: Secondary | ICD-10-CM | POA: Diagnosis not present

## 2019-05-14 DIAGNOSIS — M19012 Primary osteoarthritis, left shoulder: Secondary | ICD-10-CM | POA: Diagnosis not present

## 2019-05-14 DIAGNOSIS — Y999 Unspecified external cause status: Secondary | ICD-10-CM | POA: Diagnosis not present

## 2019-05-14 DIAGNOSIS — M7542 Impingement syndrome of left shoulder: Secondary | ICD-10-CM | POA: Diagnosis not present

## 2019-05-14 DIAGNOSIS — M75122 Complete rotator cuff tear or rupture of left shoulder, not specified as traumatic: Secondary | ICD-10-CM | POA: Diagnosis not present

## 2019-05-14 DIAGNOSIS — S43432A Superior glenoid labrum lesion of left shoulder, initial encounter: Secondary | ICD-10-CM | POA: Diagnosis not present

## 2019-05-14 DIAGNOSIS — X58XXXA Exposure to other specified factors, initial encounter: Secondary | ICD-10-CM | POA: Diagnosis not present

## 2019-05-14 DIAGNOSIS — S46012A Strain of muscle(s) and tendon(s) of the rotator cuff of left shoulder, initial encounter: Secondary | ICD-10-CM | POA: Diagnosis not present

## 2019-05-14 DIAGNOSIS — M24112 Other articular cartilage disorders, left shoulder: Secondary | ICD-10-CM | POA: Diagnosis not present

## 2019-05-14 DIAGNOSIS — G8918 Other acute postprocedural pain: Secondary | ICD-10-CM | POA: Diagnosis not present

## 2019-05-25 DIAGNOSIS — M19012 Primary osteoarthritis, left shoulder: Secondary | ICD-10-CM | POA: Diagnosis not present

## 2019-05-27 DIAGNOSIS — M25612 Stiffness of left shoulder, not elsewhere classified: Secondary | ICD-10-CM | POA: Diagnosis not present

## 2019-05-27 DIAGNOSIS — M25512 Pain in left shoulder: Secondary | ICD-10-CM | POA: Diagnosis not present

## 2019-05-27 DIAGNOSIS — M6281 Muscle weakness (generalized): Secondary | ICD-10-CM | POA: Diagnosis not present

## 2019-05-29 DIAGNOSIS — M25512 Pain in left shoulder: Secondary | ICD-10-CM | POA: Diagnosis not present

## 2019-05-29 DIAGNOSIS — M25612 Stiffness of left shoulder, not elsewhere classified: Secondary | ICD-10-CM | POA: Diagnosis not present

## 2019-05-29 DIAGNOSIS — M6281 Muscle weakness (generalized): Secondary | ICD-10-CM | POA: Diagnosis not present

## 2019-06-02 DIAGNOSIS — M25612 Stiffness of left shoulder, not elsewhere classified: Secondary | ICD-10-CM | POA: Diagnosis not present

## 2019-06-02 DIAGNOSIS — M6281 Muscle weakness (generalized): Secondary | ICD-10-CM | POA: Diagnosis not present

## 2019-06-02 DIAGNOSIS — M25512 Pain in left shoulder: Secondary | ICD-10-CM | POA: Diagnosis not present

## 2019-06-04 DIAGNOSIS — M25512 Pain in left shoulder: Secondary | ICD-10-CM | POA: Diagnosis not present

## 2019-06-04 DIAGNOSIS — M6281 Muscle weakness (generalized): Secondary | ICD-10-CM | POA: Diagnosis not present

## 2019-06-04 DIAGNOSIS — M25612 Stiffness of left shoulder, not elsewhere classified: Secondary | ICD-10-CM | POA: Diagnosis not present

## 2019-06-10 ENCOUNTER — Telehealth: Payer: Self-pay

## 2019-06-10 NOTE — Telephone Encounter (Signed)
  Chronic Care Management   Note  06/10/2019 Name: Karen Hardin MRN: [id number] DOB: 03-19-1965  Karen Hardin is a 54 y.o. year old female who is a primary care patient of [name], NP. I reached out to Aleda Grana by phone today in response to a referral sent by Ms. Gaye Alken Ransdell's PCP, [name], NP.   [name] was given information about Chronic Care Management services today including:  1. CCM service includes personalized support from designated clinical staff supervised by her physician, including individualized plan of care and coordination with other care providers 2. 24/7 contact phone numbers for assistance for urgent and routine care needs. 3. Service will only be billed when office clinical staff spend 20 minutes or more in a month to coordinate care. 4. Only one practitioner may furnish and bill the service in a calendar month. 5. The patient may stop CCM services at any time (effective at the end of the month) by phone call to the office staff.  Patient agreed to services and verbal consent obtained.    Also received message from [place] [facility] to address gaps in refill history for lisinopril. Called patient to address adherence and reasons for gaps in refill history from 05/30/2018 to 08/06/2018). She mentioned intially being on lisinopril 5mg  and then decreased to 2.5mg  daily. She did state she did not refill 2.5mg  since she wanted to work on her supply of lisinopril 5mg . She was unsure on timeframe when she did this. (unable to confirm if it was between 05/30/2018 to 08/06/2018). However, patient stated since she finished supply of 5mg , she has been refilling lisinopril 2.5mg  ever since and endorses full compliance. Patient consented to CCM and CCM visit has been scheduled for 06/26/2019.  Follow up plan: CCM visit scheduled for 06/26/2019 at 1:00pm.   Anson Crofts, PharmD Clinical Pharmacist [facility] at  [place] [phone]

## 2019-06-11 DIAGNOSIS — M25612 Stiffness of left shoulder, not elsewhere classified: Secondary | ICD-10-CM | POA: Diagnosis not present

## 2019-06-11 DIAGNOSIS — M25512 Pain in left shoulder: Secondary | ICD-10-CM | POA: Diagnosis not present

## 2019-06-11 DIAGNOSIS — M6281 Muscle weakness (generalized): Secondary | ICD-10-CM | POA: Diagnosis not present

## 2019-06-17 DIAGNOSIS — M25612 Stiffness of left shoulder, not elsewhere classified: Secondary | ICD-10-CM | POA: Diagnosis not present

## 2019-06-17 DIAGNOSIS — M25512 Pain in left shoulder: Secondary | ICD-10-CM | POA: Diagnosis not present

## 2019-06-17 DIAGNOSIS — M6281 Muscle weakness (generalized): Secondary | ICD-10-CM | POA: Diagnosis not present

## 2019-06-19 ENCOUNTER — Other Ambulatory Visit: Payer: Self-pay | Admitting: Adult Health

## 2019-06-19 DIAGNOSIS — E1165 Type 2 diabetes mellitus with hyperglycemia: Secondary | ICD-10-CM

## 2019-06-19 DIAGNOSIS — I10 Essential (primary) hypertension: Secondary | ICD-10-CM

## 2019-06-22 DIAGNOSIS — M25512 Pain in left shoulder: Secondary | ICD-10-CM | POA: Diagnosis not present

## 2019-06-24 DIAGNOSIS — M6281 Muscle weakness (generalized): Secondary | ICD-10-CM | POA: Diagnosis not present

## 2019-06-24 DIAGNOSIS — M25512 Pain in left shoulder: Secondary | ICD-10-CM | POA: Diagnosis not present

## 2019-06-24 DIAGNOSIS — M25612 Stiffness of left shoulder, not elsewhere classified: Secondary | ICD-10-CM | POA: Diagnosis not present

## 2019-06-26 ENCOUNTER — Other Ambulatory Visit: Payer: Self-pay

## 2019-06-26 ENCOUNTER — Ambulatory Visit: Payer: PPO

## 2019-06-26 DIAGNOSIS — J45909 Unspecified asthma, uncomplicated: Secondary | ICD-10-CM

## 2019-06-26 DIAGNOSIS — E559 Vitamin D deficiency, unspecified: Secondary | ICD-10-CM

## 2019-06-26 DIAGNOSIS — E1165 Type 2 diabetes mellitus with hyperglycemia: Secondary | ICD-10-CM

## 2019-06-26 NOTE — Chronic Care Management (AMB) (Signed)
Chronic Care Management Pharmacy  Name: Karen Hardin  MRN: [id number] DOB: June 02, 1965  Initial Questions: 1. Have you seen any other providers since your last visit? NA 2. Any changes in your medicines or health? Yes - glipizide  Chief Complaint/ HPI  Karen Hardin,  54 y.o. , female presents for their Initial CCM visit with the clinical pharmacist via telephone due to COVID-19 Pandemic.  PCP : [name], NP  Their chronic conditions include: migraines, asthma, DM, fibromyalgia, vitamin D deficiency, vitamin B12 deficiency,   Office Visits: 04/23/2019- [name], MD- patient presented for office visit for pre-operative clearance. Patient has pending surgery with Raliegh Ip for left shoulder rotator cuff repair. Clearance will be faxed once results are obtained from EKG, CBC, BMET, A1c, Protime-INR. Patient placed back on glipizide 2.65m ER. Patient also prescribed Cymbalta 377mdaily for myofascial pain in addition to Flexeril 1032m  10/01/2018- Rheumatology- ShaBo MerinoD- Patient presented for office visit for generalized pain. CK and aldolase were normal. Patient was advised to follow up with PCP for future treatment. Patient may benefit from Cymbalta. Patient referred to physical therapy for bilateral shoulder joint pain and myofascial pain. Isometric exercises were discussed for hypermobility arthralgia. Patient given back exercises handout at last visit for arthropathy of lumbar facet joint. Patient was also given knee joint exercises for chondromalacia of patella. Weight loss and exercise will benefit. Patient to return if symptoms worsen or fail to improve for MFPS and DDD.   Medications: Outpatient Encounter Medications as of 06/26/2019  Medication Sig  . Ascorbic Acid (VITAMIN C PO) Take 750 mg by mouth daily.   . Blood Glucose Monitoring Suppl (ONEWendell/Device KIT Test once daily. Dx: E11.9  . Cholecalciferol (VITAMIN D3) 125 MCG  (5000 UT) TABS Take 1 tablet by mouth daily.   . cyclobenzaprine (FLEXERIL) 10 MG tablet TAKE 1 TABLET BY MOUTH EVERY 8 HOURS AS NEEDED FOR MUSCLE SPASM  . Ferrous Sulfate (IRON PO) Take 65 mg by mouth daily.   . gMarland KitchenipiZIDE (GLUCOTROL XL) 2.5 MG 24 hr tablet Take 1 tablet (2.5 mg total) by mouth daily with breakfast.  . glucose blood test strip Use as instructed  . lisinopril (ZESTRIL) 2.5 MG tablet Take 1 tablet (2.5 mg total) by mouth daily.  . Multiple Vitamin (MULTIVITAMIN ADULT PO) Take 1 tablet by mouth daily.  . OGlory Rosebushlica Lancets [id number] Check blood sugars twice a day  . OVER THE COUNTER MEDICATION Apply 1 application topically as needed. Theragesic Cream.  Applying to neck & back.  . PMarland KitchenOAIR HFA 108 (90 Base) MCG/ACT inhaler Inhale 2 puffs into the lungs every 4 (four) hours as needed for wheezing or shortness of breath.  . rizatriptan (MAXALT) 5 MG tablet Take 1 tablet (5 mg total) by mouth as needed.  . vitamin B-12 (CYANOCOBALAMIN) 1000 MCG tablet Take 1,000 mcg by mouth daily.  . zMarland Kitchennc gluconate 50 MG tablet Take 50 mg by mouth daily.  . aMarland Kitchenitriptyline (ELAVIL) 50 MG tablet Take 1 tablet (50 mg total) by mouth at bedtime. (Patient not taking: Reported on 06/26/2019)  . DULoxetine (CYMBALTA) 30 MG capsule Take 1 capsule (30 mg total) by mouth daily.   No facility-administered encounter medications on file as of 06/26/2019.   SDOH Interventions     Most Recent Value  SDOH Interventions  Financial Strain Interventions  Intervention Not Indicated  Transportation Interventions  Intervention Not Indicated       Current Diagnosis/Assessment:  Goals Addressed            This Visit's Progress   . Pharmacy Care Plan       CARE PLAN ENTRY  Current Barriers:  . Chronic Disease Management support, education, and care coordination needs related to Diabetes, Asthma, and Migraines, vitamin D deficiency   Diabetes . Pharmacist Clinical Goal(s): o Over the next 180 days, patient  will work with PharmD and providers to maintain A1c goal <7% . Current regimen:  o Glipizide ER 2.18m, 1 tablet daily with breakfast  o Lisinopril 2.560m 1 tablet once daily (for kidney protection) . Interventions: o We discussed: how to recognize and treat signs of hypoglycemia  . Patient self care activities - Over the next 180 days, patient will: o Check blood sugar  as directed, document, and provide at future appointments o Contact provider with any episodes of hypoglycemia  Asthma . Pharmacist Clinical Goal(s) o Over the next 180 days, patient will work with PharmD and providers to prevent worsening of breathing.  . Current regimen:   Proair HFA 10839m act inhaler, inhale 2 puffs every four hours as needed for wheezing or shortness of breat . Patient self care activities - Over the next 180 days, patient will: o Continue current medications and report worsening of breathing  Vitamin D deficiency . Pharmacist Clinical Goal(s) o Over the next 180 days, patient will work with PharmD and providers to maintain vitamin D at or above 30.  . Current regimen:  o Cholecalciferol (vitamin D3), 3,000 units once daily  . Patient self care activities o Patient will continue current medications.  Medication management . Pharmacist Clinical Goal(s): o Over the next 180 days, patient will work with PharmD and providers to maintain optimal medication adherence . [facility] o Comprehensive medication review performed. o Continue current medication management strategy o For assistance for medication cost, can apply to [employer] Call: [phone]: [email or web address] self care activities - Over the next 180 days, patient will: o Take medications as prescribed o Report any questions or concerns to PharmD and/or provider(s)  Initial goal documentation        Asthma    Eosinophil count:   Lab Results    Component Value Date/Time   EOSPCT 1.3 04/23/2019 09:01 AM  %                               Eos (Absolute):  Lab Results  Component Value Date/Time   EOSABS 0.1 04/23/2019 09:01 AM    Tobacco Status:  Social History   Tobacco Use  Smoking Status Former Smoker  . Packs/day: 0.10  . Years: 20.00  . Pack years: 2.00  . Types: Cigarettes  . Start date: 02/28/2015  Smokeless Tobacco Never Used  Tobacco Comment   patient uses nicotine gum. Smoked for over 20 years. Quit in 02/2015.    Patient has failed these meds in past: Flovent, Qvar (not using it anymore bc not having any asthma attacks/ SOB)    Patient is currently controlled on the following medications:   Proair HFA 108m34mact inhaler, inhale 2 puffs every four hours as needed for wheezing or shortness of breath  Using maintenance inhaler regularly? No    Frequency of rescue inhaler use:  prn  - last use was 2 weeks ago.   Plan Continue current medications   Diabetes  Denies dizziness/  Recent Relevant Labs: Lab Results  Component Value Date/Time   HGBA1C 6.9 (H) 04/23/2019 09:01 AM   HGBA1C 6.5 08/06/2018 11:25 AM   HGBA1C 6.0 02/20/2018 07:06 AM     Checking BG: 5 to 6x per week   Recent FBG Readings:  115-125 260-270 (couple of readings high- related to diet).   Patient is currently controlled on the following medications:  Glipizide ER 2.82m, 1 tablet daily with breakfast   Kidney protection:   Lisinopril 2.568m 1 tablet once daily   Last diabetic Eye exam:  Lab Results  Component Value Date/Time   HMDIABEYEEXA No Retinopathy 11/07/2018 12:00 AM   -- gets yearly   Last diabetic Foot exam: No results found for: HMDIABFOOTEX  -- denies neuropathy.  - has problems with feet but associated with fibromyalgia   We discussed: how to recognize and treat signs of hypoglycemia   Plan Continue current medications and control with diet and exercise    Fibromyalgia   Patient reports not  starting Cymbalta at all.   Has been holding off on her own since she wanted to start  after surgery.    Patient is currently uncontrolled on the following medications:   Duloxetine 3044m1 capsule once daily (will be trailing)  Theragesic cream, apply to neck and back   Flexeril 43m3ms needed (causes her drowsiness)   Plan Continue current medications  Migraines   Patient has failed these meds in past: Amitriptyline 50mg39mtablet at bedtime   Patient is currently controlled on the following medications:   Rizatriptan (Maxalt) 5mg, 38mablet as needed    Plan Continue current medications  Vitamin D deficiency (resolved)  Patient finished course of vitamin D 50,000 units.weekly.   Vitamin D: 48.03 (08/06/2018)   Patient is currently controlled on the following medications:  Cholecalciferol (vitamin D3), 3,000 units once daily   Plan Continue current medications  Vitamin B12 deficiency    Vitamin B12: 398 (08/06/2018)   Patient is currently controlled on the following medications:  Vitamin B-12 1000mcg,29mablet once daily   Plan Continue current medications   Iron deficiency anemia   Patient is currently controlled on the following medications:   Ferrous sulfate 65mg, 152mlet once daily  Hemoglobin & Hematocrit     Component Value Date/Time   HGB 13.5 04/23/2019 0901   HCT 40.7 04/23/2019 0901   Iron/TIBC/Ferritin/ %Sat    Component Value Date/Time   IRON 59 08/06/2018 1133   TIBC 339 02/18/2018 1634   FERRITIN 37.7 08/06/2018 1133   IRONPCTSAT 16.2 (L) 08/06/2018 1133   IRONPCTSAT 11 (L) 02/18/2018 1634    Plan Continue current medications.   OTC/supplements  Patient is currently on the following:   Ascorbic acid, 750mg onc82mily   We discussed:   - recommended staying hydrated when taking ascorbic acid; patient wants to continue use.   Medication Management  Patient organizes medications: has own strategy. She places them on  kitchen counter where she sees them [facility] Follow up visit with PharmD in 6 months.  Rickardo Brinegar DAnson CroftsClinical Pharmacist [place] [phone]

## 2019-06-30 DIAGNOSIS — M25512 Pain in left shoulder: Secondary | ICD-10-CM | POA: Diagnosis not present

## 2019-06-30 DIAGNOSIS — M25612 Stiffness of left shoulder, not elsewhere classified: Secondary | ICD-10-CM | POA: Diagnosis not present

## 2019-06-30 DIAGNOSIS — M6281 Muscle weakness (generalized): Secondary | ICD-10-CM | POA: Diagnosis not present

## 2019-07-07 DIAGNOSIS — M6281 Muscle weakness (generalized): Secondary | ICD-10-CM | POA: Diagnosis not present

## 2019-07-07 DIAGNOSIS — M25512 Pain in left shoulder: Secondary | ICD-10-CM | POA: Diagnosis not present

## 2019-07-07 DIAGNOSIS — M25612 Stiffness of left shoulder, not elsewhere classified: Secondary | ICD-10-CM | POA: Diagnosis not present

## 2019-07-10 NOTE — Patient Instructions (Addendum)
Visit Information  Goals Addressed            This Visit's Progress   . Pharmacy Care Plan       CARE PLAN ENTRY  Current Barriers:  . Chronic Disease Management support, education, and care coordination needs related to Diabetes, Asthma, and Migraines, vitamin D deficiency   Diabetes . Pharmacist Clinical Goal(s): o Over the next 180 days, patient will work with PharmD and providers to maintain A1c goal <7% . Current regimen:  o Glipizide ER 2.81m, 1 tablet daily with breakfast  o Lisinopril 2.521m 1 tablet once daily (for kidney protection) . Interventions: o We discussed: how to recognize and treat signs of hypoglycemia  . Patient self care activities - Over the next 180 days, patient will: o Check blood sugar  as directed, document, and provide at future appointments o Contact provider with any episodes of hypoglycemia  Asthma . Pharmacist Clinical Goal(s) o Over the next 180 days, patient will work with PharmD and providers to prevent worsening of breathing.  . Current regimen:   Proair HFA 10836m act inhaler, inhale 2 puffs every four hours as needed for wheezing or shortness of breat . Patient self care activities - Over the next 180 days, patient will: o Continue current medications and report worsening of breathing  Vitamin D deficiency . Pharmacist Clinical Goal(s) o Over the next 180 days, patient will work with PharmD and providers to maintain vitamin D at or above 30.  . Current regimen:  o Cholecalciferol (vitamin D3), 3,000 units once daily  . Patient self care activities o Patient will continue current medications.  Medication management . Pharmacist Clinical Goal(s): o Over the next 180 days, patient will work with PharmD and providers to maintain optimal medication adherence . [facility] o Comprehensive medication review performed. o Continue current medication management strategy o For assistance for  medication cost, can apply to [facility] [place] Call: [phone]: [email or web address] self care activities - Over the next 180 days, patient will: o Take medications as prescribed o Report any questions or concerns to PharmD and/or provider(s)  Initial goal documentation        Ms. FraMcguirks given information about Chronic Care Management services today including:  1. CCM service includes personalized support from designated clinical staff supervised by her physician, including individualized plan of care and coordination with other care providers 2. 24/7 contact phone numbers for assistance for urgent and routine care needs. 3. Standard insurance, coinsurance, copays and deductibles apply for chronic care management only during months in which we provide at least 20 minutes of these services. Most insurances cover these services at 100%, however patients may be responsible for any copay, coinsurance and/or deductible if applicable. This service may help you avoid the need for more expensive face-to-face services. 4. Only one practitioner may furnish and bill the service in a calendar month. 5. The patient may stop CCM services at any time (effective at the end of the month) by phone call to the office staff.  Patient agreed to services and verbal consent obtained.   The patient verbalized understanding of instructions provided today and agreed to receive a mailed copy of patient instruction and/or educational materials.  Telephone follow up appointment with pharmacy team member scheduled for: 12/28/2019  AnnAnson CroftsharmD Clinical Pharmacist [phone] Preventing Hypoglycemia Hypoglycemia occurs when the level of sugar (glucose) in the blood  is too low. Hypoglycemia can happen in people who do or do not have diabetes (diabetes mellitus). It can develop quickly, and it can be a medical emergency.  For most people with diabetes, a blood glucose level below 70 mg/dL (3.9 mmol/L) is considered hypoglycemia. Glucose is a type of sugar that provides the body's main source of energy. Certain hormones (insulin and glucagon) control the level of glucose in the blood. Insulin lowers blood glucose, and glucagon increases blood glucose. Hypoglycemia can result from having too much insulin in the bloodstream, or from not eating enough food that contains glucose. Your risk for hypoglycemia is higher:  If you take insulin or diabetes medicines to help lower your blood glucose or help your body make more insulin.  If you skip or delay a meal or snack.  If you are ill.  During and after exercise. You can prevent hypoglycemia by working with your health care provider to adjust your meal plan as needed and by taking other precautions. How can hypoglycemia affect me? Mild symptoms Mild hypoglycemia may not cause any symptoms. If you do have symptoms, they may include:  Hunger.  Anxiety.  Sweating and feeling clammy.  Dizziness or feeling light-headed.  Sleepiness.  Nausea.  Increased heart rate.  Headache.  Blurry vision.  Irritability.  Tingling or numbness around the mouth, lips, or tongue.  A change in coordination.  Restless sleep. If mild hypoglycemia is not recognized and treated, it can quickly become moderate or severe hypoglycemia. Moderate symptoms Moderate hypoglycemia can cause:  Mental confusion and poor judgment.  Behavior changes.  Weakness.  Irregular heartbeat. Severe symptoms Severe hypoglycemia is a medical emergency. It can cause:  Fainting.  Seizures.  Loss of consciousness (coma).  Death. What nutrition changes can be made?  Work with your health care provider or diet and nutrition specialist (dietitian) to make a healthy meal plan that is right for you. Follow your meal plan carefully.  Eat meals at regular times.  If recommended by your  health care provider, have snacks between meals.  Donot skip or delay meals or snacks. You can be at risk for hypoglycemia if you are not getting enough carbohydrates. What lifestyle changes can be made?   Work closely with your health care provider to manage your blood glucose. Make sure you know: ? Your goal blood glucose levels. ? How and when to check your blood glucose. ? The symptoms of hypoglycemia. It is important to treat it right away to keep it from becoming severe.  Do not drink alcohol on an empty stomach.  When you are ill, check your blood glucose more often than usual. Follow your sick day plan whenever you cannot eat or drink normally. Make this plan in advance with your health care provider.  Always check your blood glucose before, during, and after exercise. How is this treated? This condition can often be treated by immediately eating or drinking something that contains sugar, such as:  Fruit juice, 4-6 oz (120-150 mL).  Regular (not diet) soda, 4-6 oz (120-150 mL).  Low-fat milk, 4 oz (120 mL).  Several pieces of hard candy.  Sugar or honey, 1 Tbsp (15 mL). Treating hypoglycemia if you have diabetes If you are alert and able to swallow safely, follow the 15:15 rule:  Take 15 grams of a rapid-acting carbohydrate. Talk with your health care provider about how much you should take.  Rapid-acting options include: ? Glucose pills (take 15 grams). ? 6-8 pieces of hard  candy. ? 4-6 oz (120-150 mL) of fruit juice. ? 4-6 oz (120-150 mL) of regular (not diet) soda.  Check your blood glucose 15 minutes after you take the carbohydrate.  If the repeat blood glucose level is still at or below 70 mg/dL (3.9 mmol/L), take 15 grams of a carbohydrate again.  If your blood glucose level does not increase above 70 mg/dL (3.9 mmol/L) after 3 tries, seek emergency medical care.  After your blood glucose level returns to normal, eat a meal or a snack within 1  hour. Treating severe hypoglycemia Severe hypoglycemia is when your blood glucose level is at or below 54 mg/dL (3 mmol/L). Severe hypoglycemia is a medical emergency. Get medical help right away. If you have severe hypoglycemia and you cannot eat or drink, you may need an injection of glucagon. A family member or close friend should learn how to check your blood glucose and how to give you a glucagon injection. Ask your health care provider if you need to have an emergency glucagon injection kit available. Severe hypoglycemia may need to be treated in a hospital. The treatment may include getting glucose through an IV. You may also need treatment for the cause of your hypoglycemia. Where to find more information  American Diabetes Association: www.diabetes.[employer] of Diabetes and Digestive and Kidney Diseases: [employer] Contact a health care provider if:  You have problems keeping your blood glucose in your target range.  You have frequent episodes of hypoglycemia. Get help right away if:  You continue to have hypoglycemia symptoms after eating or drinking something containing glucose.  Your blood glucose level is at or below 54 mg/dL (3 mmol/L).  You faint.  You have a seizure. These symptoms may represent a serious problem that is an emergency. Do not wait to see if the symptoms will go away. Get medical help right away. Call your local emergency services (911 in the [place] Summary  Know the symptoms of hypoglycemia, and when you are at risk for it (such as during exercise or when you are sick). Check your blood glucose often when you are at risk for hypoglycemia.  Hypoglycemia can develop quickly, and it can be dangerous if it is not treated right away. If you have a history of severe hypoglycemia, make sure you know how to use your glucagon injection kit.  Make sure you know how to treat hypoglycemia. Keep a carbohydrate snack available when you may be at  risk for hypoglycemia. This information is not intended to replace advice given to you by your health care provider. Make sure you discuss any questions you have with your health care provider. Document Revised: 05/23/2018 Document Reviewed: 09/26/2016 Elsevier Patient Education  [place].

## 2019-07-15 DIAGNOSIS — M6281 Muscle weakness (generalized): Secondary | ICD-10-CM | POA: Diagnosis not present

## 2019-07-15 DIAGNOSIS — M25612 Stiffness of left shoulder, not elsewhere classified: Secondary | ICD-10-CM | POA: Diagnosis not present

## 2019-07-15 DIAGNOSIS — M25512 Pain in left shoulder: Secondary | ICD-10-CM | POA: Diagnosis not present

## 2019-07-16 ENCOUNTER — Other Ambulatory Visit: Payer: Self-pay

## 2019-07-17 ENCOUNTER — Ambulatory Visit (INDEPENDENT_AMBULATORY_CARE_PROVIDER_SITE_OTHER): Payer: PPO | Admitting: Adult Health

## 2019-07-17 ENCOUNTER — Encounter: Payer: Self-pay | Admitting: Adult Health

## 2019-07-17 VITALS — BP 120/80 | Temp 97.1°F | Wt 217.0 lb

## 2019-07-17 DIAGNOSIS — M7918 Myalgia, other site: Secondary | ICD-10-CM

## 2019-07-17 DIAGNOSIS — G43809 Other migraine, not intractable, without status migrainosus: Secondary | ICD-10-CM

## 2019-07-17 DIAGNOSIS — M2550 Pain in unspecified joint: Secondary | ICD-10-CM | POA: Diagnosis not present

## 2019-07-17 DIAGNOSIS — E1169 Type 2 diabetes mellitus with other specified complication: Secondary | ICD-10-CM

## 2019-07-17 LAB — POCT GLYCOSYLATED HEMOGLOBIN (HGB A1C): HbA1c, POC (controlled diabetic range): 6.3 % (ref 0.0–7.0)

## 2019-07-17 MED ORDER — GLIPIZIDE ER 2.5 MG PO TB24: 2.5000 mg | ORAL_TABLET | Freq: Every day | ORAL | 0 refills | Status: DC

## 2019-07-17 MED ORDER — RIZATRIPTAN BENZOATE 5 MG PO TABS: 5.0000 mg | ORAL_TABLET | ORAL | 3 refills | Status: DC | PRN

## 2019-07-17 MED ORDER — DULOXETINE HCL 30 MG PO CPEP: 30.0000 mg | ORAL_CAPSULE | Freq: Two times a day (BID) | ORAL | 1 refills | Status: DC

## 2019-07-17 NOTE — Progress Notes (Signed)
Subjective:    Patient ID: Karen Hardin, female    DOB: December 28, 1965, 54 y.o.   MRN: [id number]  HPI  54 year old female who  has a past medical history of Anemia, Arthritis, Asthma (2002), Depression, Fibrocystic breast (2005), LGSIL (low grade squamous intraepithelial lesion) on Pap smear (2007), Libido, decreased (2010), Migraines, Ovarian cyst, Stargardt's disease (1986), Tachycardia, Trichomonas, Vaginosis (2008), and Vitamin D deficiency.   She presents to the office today for 3 month follow up regarding DM. In March when she was seen she had stopped Glipizide 5 MG ER due to multiple episodes of hypoglycemia. Her A1c was 6.9.  At this time she was prescribed glipizide 2.5 mg XR . Since starting the 2.5 mg dose she has not had any episodes of hypoglycemia.  Blood sugars have been between 88 and 140.  Myofascial pain and Arthritis -she reports that she started Cymbalta 30 mg about 3 weeks ago.  Since starting this medication she has much less pain in her legs and although she does have discomfort in her shoulders as well as cervical, thoracic, and lumbar spine pain this has improved as well.  She needs a refill of Maxalt for migraine headaches    Review of Systems See HPI   Past Medical History:  Diagnosis Date  . Anemia   . 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 Asthma 2002  . Depression    as a child, and stress recently  . Fibrocystic breast 2005  . LGSIL (low grade squamous intraepithelial lesion) on Pap smear 2007  . Libido, decreased 2010  . Migraines    migraines - otc med prn, then maxalt if needed  . Ovarian cyst   . 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 Tachycardia    per patient diagnosed in ER 2014, no problems currently  . Trichomonas   . Vaginosis 2008  . Vitamin D deficiency     Social History   Socioeconomic History  . Marital status: Married    Spouse name: Not on file  . Number of children: Not on file  .  Years of education: Not on file  . Highest education level: Not on file  Occupational History  . Not on file  Tobacco Use  . Smoking status: Former Smoker    Packs/day: 0.10    Years: 20.00    Pack years: 2.00    Types: Cigarettes    Start date: 02/28/2015  . Smokeless tobacco: Never Used  . Tobacco comment: patient uses nicotine gum. Smoked for over 20 years. Quit in 02/2015.  Substance and Sexual Activity  . Alcohol use: No    Alcohol/week: 0.0 standard drinks  . Drug use: No  . Sexual activity: Yes    Birth control/protection: Surgical, Condom  Other Topics Concern  . Not on file  Social History Narrative   Is on disability    Married for 21 years    Three boys ( all three live locally)       She likes to hang out with friends. 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    Social Determinants of Health   Financial Resource Strain: Low Risk   . Difficulty of Paying Living Expenses: Not hard at all  Food Insecurity:   . Worried About Charity fundraiser in the Last Year:   . [place] in the Last Year:  Transportation Needs: No Transportation Needs  . Lack of Transportation (Medical): No  . Lack of Transportation (Non-Medical): No  Physical Activity:   . Days of Exercise per Week:   . Minutes of Exercise per Session:   Stress:   . Feeling of Stress :   Social Connections:   . Frequency of Communication with Friends and Family:   . Frequency of Social Gatherings with Friends and Family:   . Attends Religious Services:   . Active Member of Clubs or Organizations:   . Attends Archivist Meetings:   Marland Kitchen Marital Status:   Intimate Partner Violence:   . Fear of Current or Ex-Partner:   . Emotionally Abused:   Marland Kitchen Physically Abused:   . Sexually Abused:     Past Surgical History:  Procedure Laterality Date  . BREAST EXCISIONAL BIOPSY Left    benign  . BREAST SURGERY  1986   biopsy - benign  . CYSTOSCOPY  12/26/2012    Procedure: CYSTOSCOPY;  Surgeon: Ena Dawley, MD;  Location: [facility];  Service: Gynecology;;  . DILATION AND CURETTAGE OF UTERUS    . ganglion cyst removed from wrist     left  . LAPAROSCOPIC ASSISTED VAGINAL HYSTERECTOMY N/A 12/26/2012   Procedure: LAPAROSCOPIC ASSISTED VAGINAL HYSTERECTOMY Uterine Morcellation, Bilateral Salpingectomy, ;  Surgeon: Ena Dawley, MD;  Location: [facility];  Service: Gynecology;  Laterality: N/A;  . MYOMECTOMY     In [place] - laparotomy  . OOPHORECTOMY  2000   laparotomy in [place]  . SHOULDER ARTHROSCOPY W/ ROTATOR CUFF REPAIR  2006   right  . TONSILLECTOMY AND ADENOIDECTOMY    . TUBAL LIGATION  2000  . WISDOM TOOTH EXTRACTION     x 2 teeth    Family History  Problem Relation Age of Onset  . Arthritis Mother   . Diabetes Mother   . Hypertension Mother   . Pulmonary embolism Mother        Cardiac arrest   . Heart disease Father   . Heart attack Father   . Mesothelioma Father   . Hyperlipidemia Maternal Grandmother   . Breast cancer Maternal Grandmother   . Colitis Paternal Aunt        in 66's  . Heart attack Paternal Aunt   . Heart attack Paternal Uncle   . Stroke Paternal Aunt   . Breast cancer Maternal Aunt     No Known Allergies  Current Outpatient Medications on File Prior to Visit  Medication Sig Dispense Refill  . Ascorbic Acid (VITAMIN C PO) Take 750 mg by mouth daily.     . Blood Glucose Monitoring Suppl ([place]) w/Device KIT Test once daily. Dx: E11.9 1 each 0  . Cholecalciferol (VITAMIN D3) 125 MCG (5000 UT) TABS Take 1 tablet by mouth daily.     . cyclobenzaprine (FLEXERIL) 10 MG tablet TAKE 1 TABLET BY MOUTH EVERY 8 HOURS AS NEEDED FOR MUSCLE SPASM 30 tablet 1  . Ferrous Sulfate (IRON PO) Take 65 mg by mouth daily.     Marland Kitchen glucose blood test strip Use as instructed 200 each 3  . lisinopril (ZESTRIL) 2.5 MG tablet Take 1 tablet (2.5 mg total) by mouth daily. 90 tablet 3  . Multiple Vitamin  (MULTIVITAMIN ADULT PO) Take 1 tablet by mouth daily.    Glory Rosebush Delica Lancets 62G MISC Check blood sugars twice a day 200 each 3  . OVER THE COUNTER MEDICATION Apply 1 application topically as needed.  Theragesic Cream.  Applying to neck & back.    Marland Kitchen PROAIR HFA 108 (90 Base) MCG/ACT inhaler Inhale 2 puffs into the lungs every 4 (four) hours as needed for wheezing or shortness of breath. 18 g 2  . vitamin B-12 (CYANOCOBALAMIN) 1000 MCG tablet Take 1,000 mcg by mouth daily.    Marland Kitchen zinc gluconate 50 MG tablet Take 50 mg by mouth daily.     No current facility-administered medications on file prior to visit.    BP 120/80   Temp (!) 97.1 F (36.2 C)   Wt 217 lb (98.4 kg)   LMP 12/14/2012   BMI 32.99 kg/m       Objective:   Physical Exam Vitals and nursing note reviewed.  Constitutional:      Appearance: Normal appearance.  Musculoskeletal:        General: Normal range of motion.  Skin:    General: Skin is warm and dry.  Neurological:     General: No focal deficit present.     Mental Status: She is alert and oriented to person, place, and time.  Psychiatric:        Mood and Affect: Mood normal.        Behavior: Behavior normal.        Thought Content: Thought content normal.        Judgment: Judgment normal.       Assessment & Plan:  1. Type 2 diabetes mellitus with other specified complication, without long-term current use of insulin (HCC) - POCT A1C- 6.3 - at goal. Continue with Glipizide 2.5 mg XR -Follow-up in 3 months for CPE 2. Other migraine without status migrainosus, not intractable - rizatriptan (MAXALT) 5 MG tablet; Take 1 tablet (5 mg total) by mouth as needed.  Dispense: 10 tablet; Refill: 3  3. Myofascial pain syndrome -We will increase Cymbalta to 60 mg in 2 divided doses to see if we can get her better pain control. - DULoxetine (CYMBALTA) 30 MG capsule; Take 1 capsule (30 mg total) by mouth 2 (two) times daily.  Dispense: 180 capsule; Refill: 1  4.  Polyarthralgia  - DULoxetine (CYMBALTA) 30 MG capsule; Take 1 capsule (30 mg total) by mouth 2 (two) times daily.  Dispense: 180 capsule; Refill: 1   [name], NP

## 2019-07-17 NOTE — Patient Instructions (Signed)
Your A1c is 6.3 - this has come down  I have increased Cymbalta to 30 mg twice a day   Follow up in 3 months for your physical exam

## 2019-07-22 DIAGNOSIS — M25612 Stiffness of left shoulder, not elsewhere classified: Secondary | ICD-10-CM | POA: Diagnosis not present

## 2019-07-22 DIAGNOSIS — M6281 Muscle weakness (generalized): Secondary | ICD-10-CM | POA: Diagnosis not present

## 2019-07-22 DIAGNOSIS — M25512 Pain in left shoulder: Secondary | ICD-10-CM | POA: Diagnosis not present

## 2019-08-05 DIAGNOSIS — M25512 Pain in left shoulder: Secondary | ICD-10-CM | POA: Diagnosis not present

## 2019-08-14 ENCOUNTER — Other Ambulatory Visit: Payer: Self-pay | Admitting: Adult Health

## 2019-08-14 DIAGNOSIS — M2550 Pain in unspecified joint: Secondary | ICD-10-CM

## 2019-08-14 DIAGNOSIS — M7918 Myalgia, other site: Secondary | ICD-10-CM

## 2019-08-18 NOTE — Telephone Encounter (Signed)
SENT TO THE PHARMACY FOR 6 MONTHS ON 07/17/2019.

## 2019-09-02 DIAGNOSIS — M25512 Pain in left shoulder: Secondary | ICD-10-CM | POA: Diagnosis not present

## 2019-10-16 ENCOUNTER — Ambulatory Visit (INDEPENDENT_AMBULATORY_CARE_PROVIDER_SITE_OTHER): Payer: PPO | Admitting: Adult Health

## 2019-10-16 ENCOUNTER — Encounter: Payer: Self-pay | Admitting: Adult Health

## 2019-10-16 ENCOUNTER — Other Ambulatory Visit: Payer: Self-pay

## 2019-10-16 VITALS — BP 124/80 | HR 75 | Temp 98.0°F | Ht 68.0 in | Wt 217.4 lb

## 2019-10-16 DIAGNOSIS — E538 Deficiency of other specified B group vitamins: Secondary | ICD-10-CM

## 2019-10-16 DIAGNOSIS — Z0000 Encounter for general adult medical examination without abnormal findings: Secondary | ICD-10-CM | POA: Diagnosis not present

## 2019-10-16 DIAGNOSIS — E559 Vitamin D deficiency, unspecified: Secondary | ICD-10-CM | POA: Diagnosis not present

## 2019-10-16 DIAGNOSIS — E1169 Type 2 diabetes mellitus with other specified complication: Secondary | ICD-10-CM

## 2019-10-16 DIAGNOSIS — D509 Iron deficiency anemia, unspecified: Secondary | ICD-10-CM | POA: Diagnosis not present

## 2019-10-16 DIAGNOSIS — J45909 Unspecified asthma, uncomplicated: Secondary | ICD-10-CM | POA: Diagnosis not present

## 2019-10-16 DIAGNOSIS — M7918 Myalgia, other site: Secondary | ICD-10-CM | POA: Diagnosis not present

## 2019-10-16 DIAGNOSIS — G43809 Other migraine, not intractable, without status migrainosus: Secondary | ICD-10-CM | POA: Diagnosis not present

## 2019-10-16 LAB — POCT GLYCOSYLATED HEMOGLOBIN (HGB A1C): Hemoglobin A1C: 6.8 % — AB (ref 4.0–5.6)

## 2019-10-16 MED ORDER — PREGABALIN 50 MG PO CAPS: 50.0000 mg | ORAL_CAPSULE | Freq: Two times a day (BID) | ORAL | 0 refills | Status: DC

## 2019-10-16 NOTE — Progress Notes (Signed)
Subjective:    Patient ID: Karen Hardin, female    DOB: 08-23-1965, 54 y.o.   MRN: [id number]  HPI Patient presents for yearly preventative medicine examination. She is a pleasant 54 year old female who  has a past medical history of Anemia, Arthritis, Asthma (2002), Depression, Fibrocystic breast (2005), LGSIL (low grade squamous intraepithelial lesion) on Pap smear (2007), Libido, decreased (2010), Migraines, Ovarian cyst, Stargardt's disease (1986), Tachycardia, Trichomonas, Vaginosis (2008), and Vitamin D deficiency.  DM -she is currently prescribed glipizide 2.5 mg extended release.  She denies any episodes of hypoglycemia and has been monitoring her blood sugars at home and reports readings between 115-125.  She takes lisinopril 2.5 mg daily for kidney protection Lab Results  Component Value Date   HGBA1C 6.3 07/17/2019   Migraine Headaches -takes Maxalt 5 mg as needed  Asthma -mild.  Uses ProAir inhaler as needed.  She uses this infrequently  Fibromyalgia -has been seen by rheumatology in the past but was advised to follow-up with PCP for management. She is currently not taking Cymbalta as she was no longer finding relief in her chronic pain.   Vitamin D Deficiency -Currently maintained on 3000 units daily  Last vitamin D Lab Results  Component Value Date   VD25OH 48.03 08/06/2018   Vitamin B 12 Deficiency - takes OTC B12 supplement   Iron Deficiency Anemia - takes OTC iron supplement  Lab Results  Component Value Date   IRON 59 08/06/2018   TIBC 339 02/18/2018   FERRITIN 37.7 08/06/2018   All immunizations and health maintenance protocols were reviewed with the patient and needed orders were placed.  Appropriate screening laboratory values were ordered for the patient including screening of hyperlipidemia, renal function and hepatic function.  Medication reconciliation,  past medical history, social history, problem list and allergies were reviewed in detail with  the patient  Goals were established with regard to weight loss, exercise, and  diet in compliance with medications.   Wt Readings from Last 3 Encounters:  10/16/19 217 lb 6 oz (98.6 kg)  07/17/19 217 lb (98.4 kg)  04/23/19 224 lb (101.6 kg)    Review of Systems  Constitutional: Negative.   HENT: Negative.   Eyes: Negative.   Respiratory: Negative.   Cardiovascular: Negative.   Gastrointestinal: Negative.   Endocrine: Negative.   Genitourinary: Negative.   Musculoskeletal: Positive for arthralgias, back pain, myalgias and neck pain.  Skin: Negative.   Allergic/Immunologic: Negative.   Neurological: Negative.   Hematological: Negative.   Psychiatric/Behavioral: Negative.    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   Asthma 2002   Depression    as a child, and stress recently   Fibrocystic breast 2005   LGSIL (low grade squamous intraepithelial lesion) on Pap smear 2007   Libido, decreased 2010   Migraines    migraines - otc med prn, then maxalt if needed   Ovarian cyst    Stargardt's disease 1986   Patient is legally blind   Tachycardia    per patient diagnosed in ER 2014, no problems currently   Trichomonas    Vaginosis 2008   Vitamin D deficiency     Social History   Socioeconomic History   Marital status: Married    Spouse name: Not on file   Number of children: Not on file   Years of education: Not on file  Highest education level: Not on file  Occupational History   Not on file  Tobacco Use   Smoking status: Former Smoker    Packs/day: 0.10    Years: 20.00    Pack years: 2.00    Types: Cigarettes    Start date: 02/28/2015   Smokeless tobacco: Never Used   Tobacco comment: patient uses nicotine gum. Smoked for over 20 years. Quit in 02/2015.  Vaping Use   Vaping Use: Never used  Substance and Sexual Activity   Alcohol use: No    Alcohol/week: 0.0 standard  drinks   Drug use: No   Sexual activity: Yes    Birth control/protection: Surgical, Condom  Other Topics Concern   Not on file  Social History Narrative   Is on disability    Married for 21 years    Three boys ( all three live locally)       She likes to hang out with friends. 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    Social Determinants of Health   Financial Resource Strain: Low Risk    Difficulty of Paying Living Expenses: Not hard at all  Food Insecurity:    Worried About Charity fundraiser in the Last Year: Not on file   [employer] of Food in the Last Year: Not on file  Transportation Needs: No Transportation Needs   Lack of Transportation (Medical): No   Lack of Transportation (Non-Medical): No  Physical Activity:    Days of Exercise per Week: Not on file   Minutes of Exercise per Session: Not on file  Stress:    Feeling of Stress : Not on file  Social Connections:    Frequency of Communication with Friends and Family: Not on file   Frequency of Social Gatherings with Friends and Family: Not on file   Attends Religious Services: Not on file   Active Member of Clubs or Organizations: Not on file   Attends Archivist Meetings: Not on file   Marital Status: Not on file  Intimate Partner Violence:    Fear of Current or Ex-Partner: Not on file   Emotionally Abused: Not on file   Physically Abused: Not on file   Sexually Abused: Not on file    Past Surgical History:  Procedure Laterality Date   BREAST EXCISIONAL BIOPSY Left    benign   BREAST SURGERY  1986   biopsy - benign   CYSTOSCOPY  12/26/2012   Procedure: CYSTOSCOPY;  Surgeon: Ena Dawley, MD;  Location: [facility];  Service: Gynecology;;   DILATION AND CURETTAGE OF UTERUS     ganglion cyst removed from wrist     left   LAPAROSCOPIC ASSISTED VAGINAL HYSTERECTOMY N/A 12/26/2012   Procedure: LAPAROSCOPIC ASSISTED VAGINAL HYSTERECTOMY Uterine  Morcellation, Bilateral Salpingectomy, ;  Surgeon: Ena Dawley, MD;  Location: [facility];  Service: Gynecology;  Laterality: N/A;   MYOMECTOMY     In [place] - laparotomy   OOPHORECTOMY  2000   laparotomy in [place]   SHOULDER ARTHROSCOPY W/ ROTATOR CUFF REPAIR  2006   right   TONSILLECTOMY AND ADENOIDECTOMY     TUBAL LIGATION  2000   WISDOM TOOTH EXTRACTION     x 2 teeth    Family History  Problem Relation Age of Onset   Arthritis Mother    Diabetes Mother    Hypertension Mother    Pulmonary embolism Mother        Cardiac  arrest    Heart disease Father    Heart attack Father    Mesothelioma Father    Hyperlipidemia Maternal Grandmother    Breast cancer Maternal Grandmother    Colitis Paternal Aunt        in 42's   Heart attack Paternal Aunt    Heart attack Paternal Uncle    Stroke Paternal Aunt    Breast cancer Maternal Aunt     No Known Allergies  Current Outpatient Medications on File Prior to Visit  Medication Sig Dispense Refill   Ascorbic Acid (VITAMIN C PO) Take 750 mg by mouth daily.      Blood Glucose Monitoring Suppl ([place]) w/Device KIT Test once daily. Dx: E11.9 1 each 0   Cholecalciferol (VITAMIN D3) 125 MCG (5000 UT) TABS Take 1 tablet by mouth daily.      cyclobenzaprine (FLEXERIL) 10 MG tablet TAKE 1 TABLET BY MOUTH EVERY 8 HOURS AS NEEDED FOR MUSCLE SPASM 30 tablet 1   DULoxetine (CYMBALTA) 30 MG capsule Take 1 capsule (30 mg total) by mouth 2 (two) times daily. 180 capsule 1   Ferrous Sulfate (IRON PO) Take 65 mg by mouth daily.      glipiZIDE (GLUCOTROL XL) 2.5 MG 24 hr tablet Take 1 tablet (2.5 mg total) by mouth daily with breakfast. 90 tablet 0   glucose blood test strip Use as instructed 200 each 3   lisinopril (ZESTRIL) 2.5 MG tablet Take 1 tablet (2.5 mg total) by mouth daily. 90 tablet 3   Multiple Vitamin (MULTIVITAMIN ADULT PO) Take 1 tablet by mouth daily.     OneTouch Delica Lancets 68L  MISC Check blood sugars twice a day 200 each 3   OVER THE COUNTER MEDICATION Apply 1 application topically as needed. Theragesic Cream.  Applying to neck & back.     PROAIR HFA 108 (90 Base) MCG/ACT inhaler Inhale 2 puffs into the lungs every 4 (four) hours as needed for wheezing or shortness of breath. 18 g 2   rizatriptan (MAXALT) 5 MG tablet Take 1 tablet (5 mg total) by mouth as needed. 10 tablet 3   vitamin B-12 (CYANOCOBALAMIN) 1000 MCG tablet Take 1,000 mcg by mouth daily.     zinc gluconate 50 MG tablet Take 50 mg by mouth daily.     No current facility-administered medications on file prior to visit.    LMP 12/14/2012       Objective:   Physical Exam Vitals and nursing note reviewed.  Constitutional:      General: She is not in acute distress.    Appearance: Normal appearance. She is well-developed. She is not ill-appearing.  HENT:     Head: Normocephalic and atraumatic.     Right Ear: Tympanic membrane, ear canal and external ear normal. There is no impacted cerumen.     Left Ear: Tympanic membrane, ear canal and external ear normal. There is no impacted cerumen.     Nose: Nose normal. No congestion or rhinorrhea.     Mouth/Throat:     Mouth: Mucous membranes are moist.     Pharynx: Oropharynx is clear. No oropharyngeal exudate or posterior oropharyngeal erythema.  Eyes:     General:        Right eye: No discharge.        Left eye: No discharge.     Extraocular Movements: Extraocular movements intact.     Conjunctiva/sclera: Conjunctivae normal.     Pupils: Pupils are equal, round, and  reactive to light.  Neck:     Thyroid: No thyromegaly.     Vascular: No carotid bruit.     Trachea: No tracheal deviation.  Cardiovascular:     Rate and Rhythm: Normal rate and regular rhythm.     Pulses: Normal pulses.     Heart sounds: Normal heart sounds. No murmur heard.  No friction rub. No gallop.   Pulmonary:     Effort: Pulmonary effort is normal. No respiratory  distress.     Breath sounds: Normal breath sounds. No stridor. No wheezing, rhonchi or rales.  Chest:     Chest wall: No tenderness.  Abdominal:     General: Abdomen is flat. Bowel sounds are normal. There is no distension.     Palpations: Abdomen is soft. There is no mass.     Tenderness: There is no abdominal tenderness. There is no right CVA tenderness, left CVA tenderness, guarding or rebound.     Hernia: No hernia is present.  Musculoskeletal:        General: Tenderness (multiple joint pain throughout body ) present. No swelling, deformity or signs of injury. Normal range of motion.     Cervical back: Normal range of motion and neck supple.     Right lower leg: No edema.     Left lower leg: No edema.  Lymphadenopathy:     Cervical: No cervical adenopathy.  Skin:    General: Skin is warm and dry.     Capillary Refill: Capillary refill takes less than 2 seconds.     Coloration: Skin is not jaundiced or pale.     Findings: No bruising, erythema, lesion or rash.  Neurological:     General: No focal deficit present.     Mental Status: She is alert and oriented to person, place, and time.     Cranial Nerves: No cranial nerve deficit.     Sensory: No sensory deficit.     Motor: No weakness.     Coordination: Coordination normal.     Gait: Gait normal.     Deep Tendon Reflexes: Reflexes normal.  Psychiatric:        Mood and Affect: Mood normal.        Behavior: Behavior normal.        Thought Content: Thought content normal.        Judgment: Judgment normal.       Assessment & Plan:  1. Routine general medical examination at a health care facility - Encouraged diet and exercise - Follow up in one year or sooner if needed - CBC with Differential/Platelet; Future - Hemoglobin A1c; Future - Lipid panel; Future - TSH; Future - CMP with eGFR([facility]); Future  2. Vitamin B 12 deficiency  - Vitamin B12; Future  3. Vitamin D deficiency  - Vitamin D, 25-hydroxy; Future  4.  Type 2 diabetes mellitus with other specified complication, without long-term current use of insulin (HCC) - Consider increase in glipizide  - CBC with Differential/Platelet; Future - Hemoglobin A1c; Future - Lipid panel; Future - TSH; Future - CMP with eGFR([facility]); Future  5. Other migraine without status migrainosus, not intractable - Continue with maxalt   6. Myofascial pain syndrome - D/c Cymblata and trial Lyrica. Follow up in one month  - Encouraged exercise  - pregabalin (LYRICA) 50 MG capsule; Take 1 capsule (50 mg total) by mouth 2 (two) times daily.  Dispense: 60 capsule; Refill: 0  7. Chronic asthma without complication, unspecified asthma severity, unspecified whether  persistent - Continue Albuterol PRN   8. Iron deficiency anemia, unspecified iron deficiency anemia type  - Iron, TIBC and Ferritin Panel; Future  [employer]

## 2019-10-16 NOTE — Addendum Note (Signed)
Addended by: Apolinar Junes on: 10/16/2019 01:27 PM   Modules accepted: Orders

## 2019-10-16 NOTE — Patient Instructions (Signed)
It was great seeing you today   I am going to prescribe you Lyrica for pain relief. Please follow up in one month - we can do this virtually if you would like   We will follow up with regarding your blood work

## 2019-10-16 NOTE — Addendum Note (Signed)
Addended by: Marrion Coy on: 10/16/2019 01:47 PM   Modules accepted: Orders

## 2019-10-16 NOTE — Addendum Note (Signed)
Addended by: Marrion Coy on: 10/16/2019 01:28 PM   Modules accepted: Orders

## 2019-10-19 LAB — COMPLETE METABOLIC PANEL WITH GFR
AG Ratio: 1.6 (calc) (ref 1.0–2.5)
ALT: 18 U/L (ref 6–29)
AST: 14 U/L (ref 10–35)
Albumin: 4.1 g/dL (ref 3.6–5.1)
Alkaline phosphatase (APISO): 94 U/L (ref 37–153)
BUN: 11 mg/dL (ref 7–25)
CO2: 24 mmol/L (ref 20–32)
Calcium: 9.7 mg/dL (ref 8.6–10.4)
Chloride: 107 mmol/L (ref 98–110)
Creat: 0.75 mg/dL (ref 0.50–1.05)
GFR, Est African American: 104 mL/min/{1.73_m2} (ref >=60)
GFR, Est Non African American: 90 mL/min/{1.73_m2} (ref >=60)
Globulin: 2.6 g/dL (calc) (ref 1.9–3.7)
Glucose, Bld: 123 mg/dL — ABNORMAL HIGH (ref 65–99)
Potassium: 3.7 mmol/L (ref 3.5–5.3)
Sodium: 141 mmol/L (ref 135–146)
Total Bilirubin: 0.2 mg/dL (ref 0.2–1.2)
Total Protein: 6.7 g/dL (ref 6.1–8.1)

## 2019-10-19 LAB — TSH: TSH: 0.67 mIU/L

## 2019-10-19 LAB — IRON,TIBC AND FERRITIN PANEL
%SAT: 14 % (calc) — ABNORMAL LOW (ref 16–45)
Ferritin: 48 ng/mL (ref 16–232)
Iron: 45 ug/dL (ref 45–160)
TIBC: 327 mcg/dL (calc) (ref 250–450)

## 2019-10-19 LAB — HEMOGLOBIN A1C

## 2019-10-19 LAB — LIPID PANEL
Cholesterol: 155 mg/dL (ref <200)
HDL: 54 mg/dL (ref >=50)
LDL Cholesterol (Calc): 84 mg/dL (calc)
Non-HDL Cholesterol (Calc): 101 mg/dL (calc) (ref <130)
Total CHOL/HDL Ratio: 2.9 (calc) (ref <5.0)
Triglycerides: 82 mg/dL (ref <150)

## 2019-10-19 LAB — VITAMIN B12: Vitamin B-12: 388 pg/mL (ref 200–1100)

## 2019-10-19 LAB — VITAMIN D 25 HYDROXY (VIT D DEFICIENCY, FRACTURES): Vit D, 25-Hydroxy: 26 ng/mL — ABNORMAL LOW (ref 30–100)

## 2019-10-19 LAB — CBC WITH DIFFERENTIAL/PLATELET

## 2019-10-27 ENCOUNTER — Other Ambulatory Visit: Payer: Self-pay | Admitting: Adult Health

## 2019-10-27 DIAGNOSIS — E1169 Type 2 diabetes mellitus with other specified complication: Secondary | ICD-10-CM

## 2019-10-29 ENCOUNTER — Other Ambulatory Visit: Payer: Self-pay | Admitting: Adult Health

## 2019-10-29 DIAGNOSIS — E1169 Type 2 diabetes mellitus with other specified complication: Secondary | ICD-10-CM

## 2019-10-29 DIAGNOSIS — E559 Vitamin D deficiency, unspecified: Secondary | ICD-10-CM

## 2019-10-29 MED ORDER — GLIPIZIDE ER 2.5 MG PO TB24: ORAL_TABLET | ORAL | 1 refills | Status: DC

## 2019-10-29 MED ORDER — VITAMIN D (ERGOCALCIFEROL) 1.25 MG (50000 UNIT) PO CAPS: 50000.0000 [IU] | ORAL_CAPSULE | ORAL | 0 refills | Status: DC

## 2019-11-04 ENCOUNTER — Other Ambulatory Visit: Payer: Self-pay

## 2019-11-04 ENCOUNTER — Ambulatory Visit (INDEPENDENT_AMBULATORY_CARE_PROVIDER_SITE_OTHER): Payer: PPO | Admitting: Adult Health

## 2019-11-04 ENCOUNTER — Encounter: Payer: Self-pay | Admitting: Adult Health

## 2019-11-04 VITALS — BP 126/78 | HR 82 | Temp 97.5°F | Wt 215.0 lb

## 2019-11-04 DIAGNOSIS — E1169 Type 2 diabetes mellitus with other specified complication: Secondary | ICD-10-CM | POA: Diagnosis not present

## 2019-11-04 LAB — POCT GLUCOSE (DEVICE FOR HOME USE): Glucose Fasting, POC: 80 mg/dL (ref 70–99)

## 2019-11-04 NOTE — Patient Instructions (Signed)
I am going to have you stop glipizide and monitor your blood sugars three times a day   Please make an appointment for a virtual visit in two weeks

## 2019-11-04 NOTE — Progress Notes (Signed)
Subjective:    Patient ID: Karen Hardin, female    DOB: Jun 10, 1965, 54 y.o.   MRN: [id number]  HPI 54 year old female who  has a past medical history of Anemia, Arthritis, Asthma (2002), Depression, Fibrocystic breast (2005), LGSIL (low grade squamous intraepithelial lesion) on Pap smear (2007), Libido, decreased (2010), Migraines, Ovarian cyst, Stargardt's disease (1986), Tachycardia, Trichomonas, Vaginosis (2008), and Vitamin D deficiency.  She presents to the office today for episodes of hypoglycemia. She is currently maintained on glipizide 2.5 mg XR for history of DM. She reports that she has seen blood sugar levels in the 50-60's over the last 4 days. She has had to leave work the last two days. She has felt shaky and sweaty when her blood sugars get this low.   The highest blood sugar she has seen has been was 165 after she ate crackers and grapes  Wt Readings from Last 3 Encounters:  10/16/19 217 lb 6 oz (98.6 kg)  07/17/19 217 lb (98.4 kg)  04/23/19 224 lb (101.6 kg)   Lab Results  Component Value Date   HGBA1C 6.8 (A) 10/16/2019    Review of Systems See HPI   Past Medical History:  Diagnosis Date  . Anemia   . 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 Asthma 2002  . Depression    as a child, and stress recently  . Fibrocystic breast 2005  . LGSIL (low grade squamous intraepithelial lesion) on Pap smear 2007  . Libido, decreased 2010  . Migraines    migraines - otc med prn, then maxalt if needed  . Ovarian cyst   . 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 Tachycardia    per patient diagnosed in ER 2014, no problems currently  . Trichomonas   . Vaginosis 2008  . Vitamin D deficiency     Social History   Socioeconomic History  . Marital status: Married    Spouse name: Not on file  . Number of children: Not on file  . Years of education: Not on file  . Highest education level: Not on file  Occupational History  . Not on  file  Tobacco Use  . Smoking status: Former Smoker    Packs/day: 0.10    Years: 20.00    Pack years: 2.00    Types: Cigarettes    Start date: 02/28/2015  . Smokeless tobacco: Never Used  . Tobacco comment: patient uses nicotine gum. Smoked for over 20 years. Quit in 02/2015.  Vaping Use  . Vaping Use: Never used  Substance and Sexual Activity  . Alcohol use: No    Alcohol/week: 0.0 standard drinks  . Drug use: No  . Sexual activity: Yes    Birth control/protection: Surgical, Condom  Other Topics Concern  . Not on file  Social History Narrative   Is on disability    Married for 21 years    Three boys ( all three live locally)       She likes to hang out with friends. 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    Social Determinants of Health   Financial Resource Strain: Low Risk   . Difficulty of Paying Living Expenses: Not hard at all  Food Insecurity:   . Worried About Charity fundraiser in the Last Year: Not on file  . Ran Out of Food in the Last Year: Not on  file  Transportation Needs: No Transportation Needs  . Lack of Transportation (Medical): No  . Lack of Transportation (Non-Medical): No  Physical Activity:   . Days of Exercise per Week: Not on file  . Minutes of Exercise per Session: Not on file  Stress:   . Feeling of Stress : Not on file  Social Connections:   . Frequency of Communication with Friends and Family: Not on file  . Frequency of Social Gatherings with Friends and Family: Not on file  . Attends Religious Services: Not on file  . Active Member of Clubs or Organizations: Not on file  . Attends Archivist Meetings: Not on file  . Marital Status: Not on file  Intimate Partner Violence:   . Fear of Current or Ex-Partner: Not on file  . Emotionally Abused: Not on file  . Physically Abused: Not on file  . Sexually Abused: Not on file    Past Surgical History:  Procedure Laterality Date  . BREAST EXCISIONAL  BIOPSY Left    benign  . BREAST SURGERY  1986   biopsy - benign  . CYSTOSCOPY  12/26/2012   Procedure: CYSTOSCOPY;  Surgeon: Ena Dawley, MD;  Location: [facility];  Service: Gynecology;;  . DILATION AND CURETTAGE OF UTERUS    . ganglion cyst removed from wrist     left  . LAPAROSCOPIC ASSISTED VAGINAL HYSTERECTOMY N/A 12/26/2012   Procedure: LAPAROSCOPIC ASSISTED VAGINAL HYSTERECTOMY Uterine Morcellation, Bilateral Salpingectomy, ;  Surgeon: Ena Dawley, MD;  Location: [facility];  Service: Gynecology;  Laterality: N/A;  . MYOMECTOMY     In [place] - laparotomy  . OOPHORECTOMY  2000   laparotomy in [place]  . SHOULDER ARTHROSCOPY W/ ROTATOR CUFF REPAIR  2006   right  . TONSILLECTOMY AND ADENOIDECTOMY    . TUBAL LIGATION  2000  . WISDOM TOOTH EXTRACTION     x 2 teeth    Family History  Problem Relation Age of Onset  . Arthritis Mother   . Diabetes Mother   . Hypertension Mother   . Pulmonary embolism Mother        Cardiac arrest   . Heart disease Father   . Heart attack Father   . Mesothelioma Father   . Hyperlipidemia Maternal Grandmother   . Breast cancer Maternal Grandmother   . Colitis Paternal Aunt        in 9's  . Heart attack Paternal Aunt   . Heart attack Paternal Uncle   . Stroke Paternal Aunt   . Breast cancer Maternal Aunt     No Known Allergies  Current Outpatient Medications on File Prior to Visit  Medication Sig Dispense Refill  . Ascorbic Acid (VITAMIN C PO) Take 750 mg by mouth daily.     . Blood Glucose Monitoring Suppl ([place]) w/Device KIT Test once daily. Dx: E11.9 1 each 0  . Cholecalciferol (VITAMIN D3) 125 MCG (5000 UT) TABS Take 1 tablet by mouth daily.     . cyclobenzaprine (FLEXERIL) 10 MG tablet TAKE 1 TABLET BY MOUTH EVERY 8 HOURS AS NEEDED FOR MUSCLE SPASM 30 tablet 1  . Ferrous Sulfate (IRON PO) Take 65 mg by mouth daily.     Marland Kitchen glipiZIDE (GLUCOTROL XL) 2.5 MG 24 hr tablet Take 1 tablet by mouth once daily with  breakfast 90 tablet 1  . glucose blood test strip Use as instructed 200 each 3  . lisinopril (ZESTRIL) 2.5 MG tablet Take 1 tablet (2.5 mg  total) by mouth daily. 90 tablet 3  . Multiple Vitamin (MULTIVITAMIN ADULT PO) Take 1 tablet by mouth daily.    Glory Rosebush Delica Lancets 67M MISC Check blood sugars twice a day 200 each 3  . OVER THE COUNTER MEDICATION Apply 1 application topically as needed. Theragesic Cream.  Applying to neck & back.    . pregabalin (LYRICA) 50 MG capsule Take 1 capsule (50 mg total) by mouth 2 (two) times daily. 60 capsule 0  . PROAIR HFA 108 (90 Base) MCG/ACT inhaler Inhale 2 puffs into the lungs every 4 (four) hours as needed for wheezing or shortness of breath. 18 g 2  . rizatriptan (MAXALT) 5 MG tablet Take 1 tablet (5 mg total) by mouth as needed. 10 tablet 3  . vitamin B-12 (CYANOCOBALAMIN) 1000 MCG tablet Take 1,000 mcg by mouth daily.    . Vitamin D, Ergocalciferol, (DRISDOL) 1.25 MG (50000 UNIT) CAPS capsule Take 1 capsule (50,000 Units total) by mouth every 7 (seven) days. 12 capsule 0  . zinc gluconate 50 MG tablet Take 50 mg by mouth daily.     No current facility-administered medications on file prior to visit.    BP 126/78 (BP Location: Left Arm, Patient Position: Sitting, Cuff Size: Large)   Pulse 82   Temp (!) 97.5 F (36.4 C) (Oral)   Wt 215 lb (97.5 kg)   LMP 12/14/2012   SpO2 96%   BMI 32.69 kg/m       Objective:   Physical Exam Vitals and nursing note reviewed.  Constitutional:      Appearance: Normal appearance.  Cardiovascular:     Rate and Rhythm: Normal rate and regular rhythm.     Pulses: Normal pulses.     Heart sounds: Normal heart sounds.  Pulmonary:     Effort: Pulmonary effort is normal.     Breath sounds: Normal breath sounds.  Skin:    General: Skin is warm and dry.     Capillary Refill: Capillary refill takes less than 2 seconds.  Neurological:     General: No focal deficit present.     Mental Status: She is alert  and oriented to person, place, and time.  Psychiatric:        Mood and Affect: Mood normal.        Behavior: Behavior normal.        Thought Content: Thought content normal.        Judgment: Judgment normal.       Assessment & Plan:  1. Type 2 diabetes mellitus with other specified complication, without long-term current use of insulin (HCC)  - POCT Glucose (Device for Home Use) - 80  - Will have her d/c glipizide  - monitor BS three times a day  - Follow up in 2 weeks via virtual visit   [name], NP

## 2019-11-13 ENCOUNTER — Encounter: Payer: Self-pay | Admitting: Adult Health

## 2019-11-13 ENCOUNTER — Other Ambulatory Visit: Payer: Self-pay | Admitting: Adult Health

## 2019-11-13 DIAGNOSIS — Z1231 Encounter for screening mammogram for malignant neoplasm of breast: Secondary | ICD-10-CM

## 2019-11-13 DIAGNOSIS — M25512 Pain in left shoulder: Secondary | ICD-10-CM | POA: Diagnosis not present

## 2019-11-16 ENCOUNTER — Other Ambulatory Visit: Payer: Self-pay | Admitting: Adult Health

## 2019-11-16 DIAGNOSIS — N644 Mastodynia: Secondary | ICD-10-CM

## 2019-11-27 ENCOUNTER — Encounter: Payer: Self-pay | Admitting: Adult Health

## 2019-11-27 ENCOUNTER — Telehealth (INDEPENDENT_AMBULATORY_CARE_PROVIDER_SITE_OTHER): Payer: PPO | Admitting: Adult Health

## 2019-11-27 DIAGNOSIS — E1169 Type 2 diabetes mellitus with other specified complication: Secondary | ICD-10-CM

## 2019-11-27 NOTE — Progress Notes (Signed)
Virtual Visit via Video Note  I connected with Karen Hardin  on 11/27/19 at  1:30 PM EDT by a video enabled telemedicine application and verified that I am speaking with the correct person using two identifiers.  Location patient: home Location provider:work or home office Persons participating in the virtual visit: patient, provider  I discussed the limitations of evaluation and management by telemedicine and the availability of in person appointments. The patient expressed understanding and agreed to proceed.   HPI: 54 year old female who is being evaluated today for 2-week follow-up regarding diabetes mellitus and hypoglycemic episodes.  When she was last seen on November 04, 2019 she reported episodes of hypoglycemia while taking glipizide 2.5 mg extended release.  She was having episodes of blood sugar levels in the 50s to 60s, when this would happen she would feel shaky and sweaty he would have to leave work.  Had her stop glipizide and monitor her blood sugars over 2-week..  She reports that over the last 2 weeks she is had blood sugars between 86 and 293, with the higher numbers being in the evening.  Her dinner is her biggest meal the day.   ROS: See pertinent positives and negatives per HPI.  Past Medical History:  Diagnosis Date  . Anemia   . 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 Asthma 2002  . Depression    as a child, and stress recently  . Fibrocystic breast 2005  . LGSIL (low grade squamous intraepithelial lesion) on Pap smear 2007  . Libido, decreased 2010  . Migraines    migraines - otc med prn, then maxalt if needed  . Ovarian cyst   . 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 Tachycardia    per patient diagnosed in ER 2014, no problems currently  . Trichomonas   . Vaginosis 2008  . Vitamin D deficiency     Past Surgical History:  Procedure Laterality Date  . BREAST EXCISIONAL BIOPSY Left    benign  . BREAST SURGERY   1986   biopsy - benign  . CYSTOSCOPY  12/26/2012   Procedure: CYSTOSCOPY;  Surgeon: Ena Dawley, MD;  Location: [facility];  Service: Gynecology;;  . DILATION AND CURETTAGE OF UTERUS    . ganglion cyst removed from wrist     left  . LAPAROSCOPIC ASSISTED VAGINAL HYSTERECTOMY N/A 12/26/2012   Procedure: LAPAROSCOPIC ASSISTED VAGINAL HYSTERECTOMY Uterine Morcellation, Bilateral Salpingectomy, ;  Surgeon: Ena Dawley, MD;  Location: [facility];  Service: Gynecology;  Laterality: N/A;  . MYOMECTOMY     In [place] - laparotomy  . OOPHORECTOMY  2000   laparotomy in [place]  . SHOULDER ARTHROSCOPY W/ ROTATOR CUFF REPAIR  2006   right  . TONSILLECTOMY AND ADENOIDECTOMY    . TUBAL LIGATION  2000  . WISDOM TOOTH EXTRACTION     x 2 teeth    Family History  Problem Relation Age of Onset  . Arthritis Mother   . Diabetes Mother   . Hypertension Mother   . Pulmonary embolism Mother        Cardiac arrest   . Heart disease Father   . Heart attack Father   . Mesothelioma Father   . Hyperlipidemia Maternal Grandmother   . Breast cancer Maternal Grandmother   . Colitis Paternal Aunt        in 77's  . Heart attack Paternal Aunt   . Heart attack Paternal Uncle   .  Stroke Paternal Aunt   . Breast cancer Maternal Aunt        Current Outpatient Medications:  .  Ascorbic Acid (VITAMIN C PO), Take 750 mg by mouth daily. , Disp: , Rfl:  .  Blood Glucose Monitoring Suppl ([place]) w/Device KIT, Test once daily. Dx: E11.9, Disp: 1 each, Rfl: 0 .  Cholecalciferol (VITAMIN D3) 125 MCG (5000 UT) TABS, Take 1 tablet by mouth daily. , Disp: , Rfl:  .  cyclobenzaprine (FLEXERIL) 10 MG tablet, TAKE 1 TABLET BY MOUTH EVERY 8 HOURS AS NEEDED FOR MUSCLE SPASM, Disp: 30 tablet, Rfl: 1 .  Ferrous Sulfate (IRON PO), Take 65 mg by mouth daily. , Disp: , Rfl:  .  glipiZIDE (GLUCOTROL XL) 2.5 MG 24 hr tablet, Take 1 tablet by mouth once daily with breakfast, Disp: 90 tablet, Rfl: 1 .   glucose blood test strip, Use as instructed, Disp: 200 each, Rfl: 3 .  lisinopril (ZESTRIL) 2.5 MG tablet, Take 1 tablet (2.5 mg total) by mouth daily., Disp: 90 tablet, Rfl: 3 .  Multiple Vitamin (MULTIVITAMIN ADULT PO), Take 1 tablet by mouth daily., Disp: , Rfl:  .  OneTouch Delica Lancets 19F MISC, Check blood sugars twice a day, Disp: 200 each, Rfl: 3 .  OVER THE COUNTER MEDICATION, Apply 1 application topically as needed. Theragesic Cream.  Applying to neck & back., Disp: , Rfl:  .  PROAIR HFA 108 (90 Base) MCG/ACT inhaler, Inhale 2 puffs into the lungs every 4 (four) hours as needed for wheezing or shortness of breath., Disp: 18 g, Rfl: 2 .  rizatriptan (MAXALT) 5 MG tablet, Take 1 tablet (5 mg total) by mouth as needed., Disp: 10 tablet, Rfl: 3 .  vitamin B-12 (CYANOCOBALAMIN) 1000 MCG tablet, Take 1,000 mcg by mouth daily., Disp: , Rfl:  .  Vitamin D, Ergocalciferol, (DRISDOL) 1.25 MG (50000 UNIT) CAPS capsule, Take 1 capsule (50,000 Units total) by mouth every 7 (seven) days., Disp: 12 capsule, Rfl: 0 .  zinc gluconate 50 MG tablet, Take 50 mg by mouth daily., Disp: , Rfl:  .  pregabalin (LYRICA) 50 MG capsule, Take 1 capsule (50 mg total) by mouth 2 (two) times daily., Disp: 60 capsule, Rfl: 0  EXAM:  VITALS per patient if applicable:  GENERAL: alert, oriented, appears well and in no acute distress  HEENT: atraumatic, conjunttiva clear, no obvious abnormalities on inspection of external nose and ears  NECK: normal movements of the head and neck  LUNGS: on inspection no signs of respiratory distress, breathing rate appears normal, no obvious gross SOB, gasping or wheezing  CV: no obvious cyanosis  MS: moves all visible extremities without noticeable abnormality  PSYCH/NEURO: pleasant and cooperative, no obvious depression or anxiety, speech and thought processing grossly intact  ASSESSMENT AND PLAN:  Discussed the following assessment and plan:  1. Type 2 diabetes mellitus  with other specified complication, without long-term current use of insulin (HCC) -We discussed options for medication management including taking glipizide in the evening instead of in the morning to help prevent lows, or switching her to Trulicity which is covered by her insurance.  She would like to hold off on Trulicity at this time and will start taking glipizide in the evening time.  She will follow-up with me in 3 months for repeat A1c and sooner if she starts having hypoglycemic episodes again     I discussed the assessment and treatment plan with the patient. The patient was provided an  opportunity to ask questions and all were answered. The patient agreed with the plan and demonstrated an understanding of the instructions.   The patient was advised to call back or seek an in-person evaluation if the symptoms worsen or if the condition fails to improve as anticipated.   [name], NP

## 2019-12-23 ENCOUNTER — Other Ambulatory Visit: Payer: Self-pay

## 2019-12-23 ENCOUNTER — Ambulatory Visit
Admission: RE | Admit: 2019-12-23 | Discharge: 2019-12-23 | Disposition: A | Discharge status: Home / Self Care | Payer: PPO | Source: Ambulatory Visit | Attending: Adult Health | Admitting: Adult Health

## 2019-12-23 ENCOUNTER — Ambulatory Visit: Payer: PPO

## 2019-12-23 DIAGNOSIS — E119 Type 2 diabetes mellitus without complications: Secondary | ICD-10-CM | POA: Diagnosis not present

## 2019-12-23 DIAGNOSIS — N644 Mastodynia: Secondary | ICD-10-CM | POA: Diagnosis not present

## 2019-12-23 DIAGNOSIS — H5213 Myopia, bilateral: Secondary | ICD-10-CM | POA: Diagnosis not present

## 2019-12-23 DIAGNOSIS — H31003 Unspecified chorioretinal scars, bilateral: Secondary | ICD-10-CM | POA: Diagnosis not present

## 2019-12-23 LAB — HM DIABETES EYE EXAM

## 2019-12-24 ENCOUNTER — Encounter: Payer: Self-pay | Admitting: Adult Health

## 2019-12-28 ENCOUNTER — Telehealth: Payer: PPO

## 2020-01-16 ENCOUNTER — Other Ambulatory Visit: Payer: Self-pay | Admitting: Adult Health

## 2020-01-16 DIAGNOSIS — M7918 Myalgia, other site: Secondary | ICD-10-CM

## 2020-01-25 ENCOUNTER — Encounter: Payer: Self-pay | Admitting: Adult Health

## 2020-02-24 ENCOUNTER — Telehealth: Payer: Self-pay | Admitting: Adult Health

## 2020-02-24 NOTE — Telephone Encounter (Signed)
Left message for patient to call back and schedule Medicare Annual Wellness Visit (AWV) either virtually or in office.   Last AWV no information please schedule at anytime with [facility] Nurse Health Advisor 1 or 2   This should be a 45 minute visit. 

## 2020-03-28 ENCOUNTER — Encounter: Payer: Self-pay | Admitting: Internal Medicine

## 2020-04-05 ENCOUNTER — Other Ambulatory Visit: Payer: Self-pay

## 2020-04-06 ENCOUNTER — Encounter: Payer: Self-pay | Admitting: Adult Health

## 2020-04-06 ENCOUNTER — Encounter: Payer: Self-pay | Admitting: Family Medicine

## 2020-04-06 ENCOUNTER — Ambulatory Visit (INDEPENDENT_AMBULATORY_CARE_PROVIDER_SITE_OTHER): Payer: PPO | Admitting: Adult Health

## 2020-04-06 VITALS — BP 160/98 | Wt 227.0 lb

## 2020-04-06 DIAGNOSIS — M7918 Myalgia, other site: Secondary | ICD-10-CM | POA: Diagnosis not present

## 2020-04-06 DIAGNOSIS — R42 Dizziness and giddiness: Secondary | ICD-10-CM | POA: Diagnosis not present

## 2020-04-06 DIAGNOSIS — I10 Essential (primary) hypertension: Secondary | ICD-10-CM

## 2020-04-06 DIAGNOSIS — M25512 Pain in left shoulder: Secondary | ICD-10-CM | POA: Diagnosis not present

## 2020-04-06 DIAGNOSIS — R519 Headache, unspecified: Secondary | ICD-10-CM

## 2020-04-06 MED ORDER — LISINOPRIL 2.5 MG PO TABS: 2.5000 mg | ORAL_TABLET | Freq: Every day | ORAL | 1 refills | Status: DC

## 2020-04-06 MED ORDER — PREGABALIN 75 MG PO CAPS: 75.0000 mg | ORAL_CAPSULE | Freq: Two times a day (BID) | ORAL | 2 refills | Status: DC

## 2020-04-06 NOTE — Progress Notes (Signed)
Subjective:    Patient ID: Karen Hardin, female    DOB: 1965-04-16, 55 y.o.   MRN: [id number]  HPI 55 year old female who  has a past medical history of Anemia, Arthritis, Asthma (2002), Depression, Fibrocystic breast (2005), LGSIL (low grade squamous intraepithelial lesion) on Pap smear (2007), Libido, decreased (2010), Migraines, Ovarian cyst, Stargardt's disease (1986), Tachycardia, Trichomonas, Vaginosis (2008), and Vitamin D deficiency.  She presents to the office today for worsening "migraine headaches as well as dizziness and worsening fibromyalgia pain.  In the office today blood pressure is elevated at 168/98.  She was prescribed lisinopril 2.5 mg due to kidney protection which was also likely keeping her blood pressure low.  She reports that she ran out of medication about 2 weeks ago and never called for refill.  He does have a history of migraines which she takes Maxalt for, but was having to take this infrequently.  Additionally she reports that since December when she was diagnosed with COVID-19 her chronic joint pain from fibromyalgia has been worse.  She has been seen by rheumatology in the past but she was advised to follow-up with her PCP for management.  We have tried her on Balto she had no real relief from this.  Currently on Lyrica 50 mg twice daily, reports that she was taking it once daily but since her pain has worsened she is increased it to the prescribed twice a day regimen and has noticed minor relief with the twice daily dosing.   Review of Systems See HPI   Past Medical History:  Diagnosis Date  . Anemia   . 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 Asthma 2002  . Depression    as a child, and stress recently  . Fibrocystic breast 2005  . LGSIL (low grade squamous intraepithelial lesion) on Pap smear 2007  . Libido, decreased 2010  . Migraines    migraines - otc med prn, then maxalt if needed  . Ovarian cyst   . Stargardt's disease  1986   Patient is legally blind  . Tachycardia    per patient diagnosed in ER 2014, no problems currently  . Trichomonas   . Vaginosis 2008  . Vitamin D deficiency     Social History   Socioeconomic History  . Marital status: Married    Spouse name: Not on file  . Number of children: Not on file  . Years of education: Not on file  . Highest education level: Not on file  Occupational History  . Not on file  Tobacco Use  . Smoking status: Former Smoker    Packs/day: 0.10    Years: 20.00    Pack years: 2.00    Types: Cigarettes    Start date: 02/28/2015  . Smokeless tobacco: Never Used  . Tobacco comment: patient uses nicotine gum. Smoked for over 20 years. Quit in 02/2015.  Vaping Use  . Vaping Use: Never used  Substance and Sexual Activity  . Alcohol use: No    Alcohol/week: 0.0 standard drinks  . Drug use: No  . Sexual activity: Yes    Birth control/protection: Surgical, Condom  Other Topics Concern  . Not on file  Social History Narrative   Is on disability    Married for 21 years    Three boys ( all three live locally)       She likes to hang out with friends. 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    Social Determinants of Health   Financial Resource Strain: Low Risk   . Difficulty of Paying Living Expenses: Not hard at all  Food Insecurity: Not on file  Transportation Needs: No Transportation Needs  . Lack of Transportation (Medical): No  . Lack of Transportation (Non-Medical): No  Physical Activity: Not on file  Stress: Not on file  Social Connections: Not on file  Intimate Partner Violence: Not on file    Past Surgical History:  Procedure Laterality Date  . BREAST EXCISIONAL BIOPSY Left    benign  . BREAST SURGERY  1986   biopsy - benign  . CYSTOSCOPY  12/26/2012   Procedure: CYSTOSCOPY;  Surgeon: Ena Dawley, MD;  Location: [facility];  Service: Gynecology;;  . DILATION AND CURETTAGE OF UTERUS    . ganglion  cyst removed from wrist     left  . LAPAROSCOPIC ASSISTED VAGINAL HYSTERECTOMY N/A 12/26/2012   Procedure: LAPAROSCOPIC ASSISTED VAGINAL HYSTERECTOMY Uterine Morcellation, Bilateral Salpingectomy, ;  Surgeon: Ena Dawley, MD;  Location: [facility];  Service: Gynecology;  Laterality: N/A;  . MYOMECTOMY     In [place] - laparotomy  . OOPHORECTOMY  2000   laparotomy in [place]  . SHOULDER ARTHROSCOPY W/ ROTATOR CUFF REPAIR  2006   right  . TONSILLECTOMY AND ADENOIDECTOMY    . TUBAL LIGATION  2000  . WISDOM TOOTH EXTRACTION     x 2 teeth    Family History  Problem Relation Age of Onset  . Arthritis Mother   . Diabetes Mother   . Hypertension Mother   . Pulmonary embolism Mother        Cardiac arrest   . Heart disease Father   . Heart attack Father   . Mesothelioma Father   . Hyperlipidemia Maternal Grandmother   . Breast cancer Maternal Grandmother   . Colitis Paternal Aunt        in 66's  . Heart attack Paternal Aunt   . Heart attack Paternal Uncle   . Stroke Paternal Aunt   . Breast cancer Maternal Aunt     No Known Allergies  Current Outpatient Medications on File Prior to Visit  Medication Sig Dispense Refill  . Ascorbic Acid (VITAMIN C PO) Take 750 mg by mouth daily.     . Blood Glucose Monitoring Suppl ([place]) w/Device KIT Test once daily. Dx: E11.9 1 each 0  . Cholecalciferol (VITAMIN D3) 125 MCG (5000 UT) TABS Take 1 tablet by mouth daily.     . cyclobenzaprine (FLEXERIL) 10 MG tablet TAKE 1 TABLET BY MOUTH EVERY 8 HOURS AS NEEDED FOR MUSCLE SPASM 30 tablet 1  . Ferrous Sulfate (IRON PO) Take 65 mg by mouth daily.     Marland Kitchen glipiZIDE (GLUCOTROL XL) 2.5 MG 24 hr tablet Take 1 tablet by mouth once daily with breakfast 90 tablet 1  . glucose blood test strip Use as instructed 200 each 3  . Multiple Vitamin (MULTIVITAMIN ADULT PO) Take 1 tablet by mouth daily.    Glory Rosebush Delica Lancets 27X MISC Check blood sugars twice a day 200 each 3  . OVER  THE COUNTER MEDICATION Apply 1 application topically as needed. Theragesic Cream.  Applying to neck & back.    Marland Kitchen PROAIR HFA 108 (90 Base) MCG/ACT inhaler Inhale 2 puffs into the lungs every 4 (four) hours as needed for wheezing or shortness of breath. 18 g 2  . rizatriptan (MAXALT) 5  MG tablet Take 1 tablet (5 mg total) by mouth as needed. 10 tablet 3  . vitamin B-12 (CYANOCOBALAMIN) 1000 MCG tablet Take 1,000 mcg by mouth daily.    . Vitamin D, Ergocalciferol, (DRISDOL) 1.25 MG (50000 UNIT) CAPS capsule Take 1 capsule (50,000 Units total) by mouth every 7 (seven) days. 12 capsule 0  . zinc gluconate 50 MG tablet Take 50 mg by mouth daily.     No current facility-administered medications on file prior to visit.    BP (!) 160/98   Wt 227 lb (103 kg)   LMP 12/14/2012   BMI 34.52 kg/m       Objective:   Physical Exam Vitals reviewed.  Constitutional:      Appearance: She is well-developed.  Cardiovascular:     Rate and Rhythm: Normal rate and regular rhythm.     Pulses: Normal pulses.     Heart sounds: Normal heart sounds.  Pulmonary:     Effort: Pulmonary effort is normal.     Breath sounds: Normal breath sounds.  Musculoskeletal:        General: Tenderness (throughout multiple joints) present.  Skin:    General: Skin is warm and dry.  Neurological:     General: No focal deficit present.     Mental Status: She is alert and oriented to person, place, and time.  Psychiatric:        Mood and Affect: Mood normal.        Behavior: Behavior normal.        Thought Content: Thought content normal.        Judgment: Judgment normal.           Assessment & Plan:  Dizziness and more frequent/worsening headaches likely due to elevation of blood pressure.  We will send in new prescription for lisinopril 2.5 mg.  We will have her follow-up in 2 weeks for repeat evaluation.  We will also increase her Lyrica from 50 mg twice daily to 75 mg twice daily to see if we can give her some  more relief in her chronic fibromyalgia pain.  She was encouraged to start exercising more often as this will really benefit her in regards to decrease pain.  We will reevaluate at her 2-week follow-up  [name], NP

## 2020-04-19 ENCOUNTER — Encounter: Payer: Self-pay | Admitting: Adult Health

## 2020-04-19 ENCOUNTER — Other Ambulatory Visit: Payer: Self-pay

## 2020-04-19 ENCOUNTER — Ambulatory Visit (INDEPENDENT_AMBULATORY_CARE_PROVIDER_SITE_OTHER): Payer: PPO | Admitting: Adult Health

## 2020-04-19 VITALS — BP 132/78 | HR 96 | Temp 98.8°F | Wt 225.4 lb

## 2020-04-19 DIAGNOSIS — E1169 Type 2 diabetes mellitus with other specified complication: Secondary | ICD-10-CM

## 2020-04-19 DIAGNOSIS — M797 Fibromyalgia: Secondary | ICD-10-CM

## 2020-04-19 DIAGNOSIS — G43809 Other migraine, not intractable, without status migrainosus: Secondary | ICD-10-CM | POA: Diagnosis not present

## 2020-04-19 DIAGNOSIS — I10 Essential (primary) hypertension: Secondary | ICD-10-CM

## 2020-04-19 LAB — POCT GLYCOSYLATED HEMOGLOBIN (HGB A1C): Hemoglobin A1C: 6.6 % — AB (ref 4.0–5.6)

## 2020-04-19 MED ORDER — KETOROLAC TROMETHAMINE 60 MG/2ML IM SOLN
60.0000 mg | Freq: Once | INTRAMUSCULAR | Status: AC
  Administered 2020-04-19: 60 mg via INTRAMUSCULAR

## 2020-04-19 MED ORDER — PROPRANOLOL HCL 20 MG PO TABS
20.0000 mg | ORAL_TABLET | Freq: Two times a day (BID) | ORAL | 0 refills | Status: DC
Start: 2020-04-19 — End: 2021-02-22

## 2020-04-19 MED ORDER — GLIPIZIDE ER 2.5 MG PO TB24
ORAL_TABLET | ORAL | 1 refills | Status: DC
Start: 2020-04-19 — End: 2020-11-04

## 2020-04-19 NOTE — Progress Notes (Signed)
Subjective:    Patient ID: Karen Hardin, female    DOB: 03/18/1965, 55 y.o.   MRN: [id number]  HPI 55 year old female who  has a past medical history of Anemia, Arthritis, Asthma (2002), Depression, Fibrocystic breast (2005), LGSIL (low grade squamous intraepithelial lesion) on Pap smear (2007), Libido, decreased (2010), Migraines, Ovarian cyst, Stargardt's disease (1986), Tachycardia, Trichomonas, Vaginosis (2008), and Vitamin D deficiency.  She presents to the office today for follow up regarding multiple issues  1. Elevated Blood pressure readings -when she was seen 2 weeks ago blood pressure was elevated at 160/98.  She had not been taking her blood pressure medication she ran out of it 2 weeks prior to the last visit and never called to refill it.  She does report that she has been taking her blood pressure medication now and has been monitoring her blood pressures at home with readings "much lower" than they were prior.  She has seen readings consistently in the 120s to 140s.  2.  During her visit 2 weeks ago she was also complaining of worsening migraine headaches.  At this time it was thought to possibly be due to her elevated blood pressure.  Despite her blood pressure coming back down to normal limits, she continues to have a daily migraine headache.  Continues to take Imitrex which helps to some degree but her headaches seem to always be coming back.  She does have a history of migraine headaches and in the past been on Elavil but this did not work well for her so she came off of it.  She is reporting aura, photophobia, phonophobia, nausea  3.  During her last visit she was also complaining of more severe chronic joint pain from fibromyalgia, this has been present since December when she was diagnosed with COVID-19.  She had been seen by rheumatology in the past, who prescribed her Lyrica 50 mg twice daily and was advised to follow-up with primary care for management.  We decided to  increase her Lyrica to 75 mg twice daily.  Today she reports that she may have noticed a little bit of improvement but not significant.  Per patient "looking for something that will take the pain away completely."  4.  She is also due for 52-monthfollow-up regarding diabetes mellitus.  She is currently prescribed glipizide 2.5 mg extended release.  Her last A1c in September 2021 was 6.8.  She does monitor her blood sugars periodically and reports readings between 90 and 170.  She does have hypoglycemic symptoms when her blood sugar reaches below 90. . Review of Systems See HPI   Past Medical History:  Diagnosis Date  . Anemia   . 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 Asthma 2002  . Depression    as a child, and stress recently  . Fibrocystic breast 2005  . LGSIL (low grade squamous intraepithelial lesion) on Pap smear 2007  . Libido, decreased 2010  . Migraines    migraines - otc med prn, then maxalt if needed  . Ovarian cyst   . 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 Tachycardia    per patient diagnosed in ER 2014, no problems currently  . Trichomonas   . Vaginosis 2008  . Vitamin D deficiency     Social History   Socioeconomic History  . Marital status: Married    Spouse name: Not on file  . Number of  children: Not on file  . Years of education: Not on file  . Highest education level: Not on file  Occupational History  . Not on file  Tobacco Use  . Smoking status: Former Smoker    Packs/day: 0.10    Years: 20.00    Pack years: 2.00    Types: Cigarettes    Start date: 02/28/2015  . Smokeless tobacco: Never Used  . Tobacco comment: patient uses nicotine gum. Smoked for over 20 years. Quit in 02/2015.  Vaping Use  . Vaping Use: Never used  Substance and Sexual Activity  . Alcohol use: No    Alcohol/week: 0.0 standard drinks  . Drug use: No  . Sexual activity: Yes    Birth control/protection: Surgical, Condom  Other Topics Concern   . Not on file  Social History Narrative   Is on disability    Married for 21 years    Three boys ( all three live locally)       She likes to hang out with friends. 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    Social Determinants of Health   Financial Resource Strain: Low Risk   . Difficulty of Paying Living Expenses: Not hard at all  Food Insecurity: Not on file  Transportation Needs: No Transportation Needs  . Lack of Transportation (Medical): No  . Lack of Transportation (Non-Medical): No  Physical Activity: Not on file  Stress: Not on file  Social Connections: Not on file  Intimate Partner Violence: Not on file    Past Surgical History:  Procedure Laterality Date  . BREAST EXCISIONAL BIOPSY Left    benign  . BREAST SURGERY  1986   biopsy - benign  . CYSTOSCOPY  12/26/2012   Procedure: CYSTOSCOPY;  Surgeon: Ena Dawley, MD;  Location: [facility];  Service: Gynecology;;  . DILATION AND CURETTAGE OF UTERUS    . ganglion cyst removed from wrist     left  . LAPAROSCOPIC ASSISTED VAGINAL HYSTERECTOMY N/A 12/26/2012   Procedure: LAPAROSCOPIC ASSISTED VAGINAL HYSTERECTOMY Uterine Morcellation, Bilateral Salpingectomy, ;  Surgeon: Ena Dawley, MD;  Location: [facility];  Service: Gynecology;  Laterality: N/A;  . MYOMECTOMY     In [place] - laparotomy  . OOPHORECTOMY  2000   laparotomy in [place]  . SHOULDER ARTHROSCOPY W/ ROTATOR CUFF REPAIR  2006   right  . TONSILLECTOMY AND ADENOIDECTOMY    . TUBAL LIGATION  2000  . WISDOM TOOTH EXTRACTION     x 2 teeth    Family History  Problem Relation Age of Onset  . Arthritis Mother   . Diabetes Mother   . Hypertension Mother   . Pulmonary embolism Mother        Cardiac arrest   . Heart disease Father   . Heart attack Father   . Mesothelioma Father   . Hyperlipidemia Maternal Grandmother   . Breast cancer Maternal Grandmother   . Colitis Paternal Aunt        in 32's  . Heart attack  Paternal Aunt   . Heart attack Paternal Uncle   . Stroke Paternal Aunt   . Breast cancer Maternal Aunt     No Known Allergies  Current Outpatient Medications on File Prior to Visit  Medication Sig Dispense Refill  . Ascorbic Acid (VITAMIN C PO) Take 750 mg by mouth daily.     . Blood Glucose Monitoring Suppl ([place]) w/Device KIT Test once daily. Dx:  E11.9 1 each 0  . Cholecalciferol (VITAMIN D3) 125 MCG (5000 UT) TABS Take 1 tablet by mouth daily.     . cyclobenzaprine (FLEXERIL) 10 MG tablet TAKE 1 TABLET BY MOUTH EVERY 8 HOURS AS NEEDED FOR MUSCLE SPASM 30 tablet 1  . Ferrous Sulfate (IRON PO) Take 65 mg by mouth daily.     Marland Kitchen glipiZIDE (GLUCOTROL XL) 2.5 MG 24 hr tablet Take 1 tablet by mouth once daily with breakfast 90 tablet 1  . glucose blood test strip Use as instructed 200 each 3  . lisinopril (ZESTRIL) 2.5 MG tablet Take 1 tablet (2.5 mg total) by mouth daily. 90 tablet 1  . Multiple Vitamin (MULTIVITAMIN ADULT PO) Take 1 tablet by mouth daily.    Glory Rosebush Delica Lancets 16P MISC Check blood sugars twice a day 200 each 3  . OVER THE COUNTER MEDICATION Apply 1 application topically as needed. Theragesic Cream.  Applying to neck & back.    . pregabalin (LYRICA) 75 MG capsule Take 1 capsule (75 mg total) by mouth 2 (two) times daily. 60 capsule 2  . PROAIR HFA 108 (90 Base) MCG/ACT inhaler Inhale 2 puffs into the lungs every 4 (four) hours as needed for wheezing or shortness of breath. 18 g 2  . rizatriptan (MAXALT) 5 MG tablet Take 1 tablet (5 mg total) by mouth as needed. 10 tablet 3  . vitamin B-12 (CYANOCOBALAMIN) 1000 MCG tablet Take 1,000 mcg by mouth daily.    . Vitamin D, Ergocalciferol, (DRISDOL) 1.25 MG (50000 UNIT) CAPS capsule Take 1 capsule (50,000 Units total) by mouth every 7 (seven) days. 12 capsule 0  . zinc gluconate 50 MG tablet Take 50 mg by mouth daily.     No current facility-administered medications on file prior to visit.    BP 132/78  (BP Location: Left Arm, Patient Position: Sitting, Cuff Size: Large)   Pulse 96   Temp 98.8 F (37.1 C) (Oral)   Wt 225 lb 6 oz (102.2 kg)   LMP 12/14/2012   SpO2 100%   BMI 34.27 kg/m       Objective:   Physical Exam Vitals and nursing note reviewed.  Constitutional:      Appearance: Normal appearance.  Cardiovascular:     Rate and Rhythm: Normal rate and regular rhythm.     Pulses: Normal pulses.     Heart sounds: Normal heart sounds.  Pulmonary:     Effort: Pulmonary effort is normal.     Breath sounds: Normal breath sounds.  Skin:    General: Skin is warm and dry.  Neurological:     General: No focal deficit present.     Mental Status: She is alert and oriented to person, place, and time.  Psychiatric:        Mood and Affect: Mood normal.        Behavior: Behavior normal.        Thought Content: Thought content normal.        Judgment: Judgment normal.        Assessment & Plan:  1. Type 2 diabetes mellitus with other specified complication, without long-term current use of insulin (HCC)  - POCT HgB A1C- 6.6 - at goal  - glipiZIDE (GLUCOTROL XL) 2.5 MG 24 hr tablet; Take 1 tablet by mouth once daily with breakfast  Dispense: 90 tablet; Refill: 1  2. Other migraine without status migrainosus, not intractable - Will trial her on Inderal 20 mg BID for migraine prophylaxis.  Can titrate up if no improvement over the next couple weeks - ketorolac (TORADOL) injection 60 mg - propranolol (INDERAL) 20 MG tablet; Take 1 tablet (20 mg total) by mouth 2 (two) times daily.  Dispense: 60 tablet; Refill: 0  3. Essential hypertension -Blood Pressure is better controlled.  We will keep her on lisinopril 2.5 mg  4. Fibromyalgia -Unfortunately there is no medication that can completely take away fibromyalgia pain.  She does have leftover duloxetine at home from a previous prescription.  Advised that there have been some reports that using duloxetine and Lyrica together can  greatly reduce her discomfort.  She will take what she has at home and follow-up with me to let me know how this works for her  [name], NP

## 2020-04-26 ENCOUNTER — Telehealth: Payer: Self-pay

## 2020-04-26 NOTE — Telephone Encounter (Signed)
FMLA papers completed and faxed to [employer] @[phone]. 04/26/20

## 2020-04-29 ENCOUNTER — Encounter: Payer: Self-pay | Admitting: Adult Health

## 2020-05-05 ENCOUNTER — Encounter: Payer: Self-pay | Admitting: Internal Medicine

## 2020-06-08 ENCOUNTER — Telehealth: Payer: Self-pay | Admitting: Adult Health

## 2020-06-08 NOTE — Telephone Encounter (Signed)
Tried calling patient to  schedule Medicare Annual Wellness Visit (AWV) either virtually or in office.  Bad connection patient could not hear me   AWV-I per [place] 02/12/09  please schedule at anytime with [facility] Nurse Health Advisor 1 or 2   This should be a 45 minute visit.

## 2020-06-17 ENCOUNTER — Other Ambulatory Visit: Payer: Self-pay

## 2020-06-17 ENCOUNTER — Other Ambulatory Visit: Payer: Self-pay | Admitting: Family Medicine

## 2020-06-17 ENCOUNTER — Ambulatory Visit: Payer: Self-pay

## 2020-06-17 DIAGNOSIS — M79644 Pain in right finger(s): Secondary | ICD-10-CM

## 2020-07-06 ENCOUNTER — Ambulatory Visit (AMBULATORY_SURGERY_CENTER): Payer: Self-pay

## 2020-07-06 ENCOUNTER — Encounter: Payer: Self-pay | Admitting: Internal Medicine

## 2020-07-06 ENCOUNTER — Other Ambulatory Visit: Payer: Self-pay

## 2020-07-06 VITALS — Ht 67.0 in | Wt 226.0 lb

## 2020-07-06 DIAGNOSIS — Z86010 Personal history of colonic polyps: Secondary | ICD-10-CM

## 2020-07-06 NOTE — Progress Notes (Signed)
Patient is legally blind however she did sign her paperwork while in office without her husband present-a copy of the consent form was sent home with patient per her request so her husband could review prior to procedure  No egg or soy allergy known to patient  No issues with past sedation with any surgeries or procedures Patient denies ever being told they had issues or difficulty with intubation  No FH of Malignant Hyperthermia No diet pills per patient No home 02 use per patient  No blood thinners per patient  Pt denies issues with constipation  No A fib or A flutter  EMMI video via MyChart  COVID 19 guidelines implemented in PV today with Pt and RN  NO PA's for preps discussed with pt in PV today  Discussed with pt there will be an out-of-pocket cost for prep and that varies from $0 to 70 dollars  Due to the COVID-19 pandemic we are asking patients to follow certain guidelines.  Pt aware of COVID protocols and [facility] guidelines

## 2020-07-07 ENCOUNTER — Telehealth: Payer: Self-pay | Admitting: Pharmacist

## 2020-07-14 NOTE — Chronic Care Management (AMB) (Signed)
Chronic Care Management Pharmacy Assistant   Name: Karen Hardin  MRN: [id number] DOB: 1965-09-09  Reason for Encounter: General Adherence Call       Recent office visits:  . 03.08.2022 [name], NP (PCP) patient seen for follow up visit for diabetes and migraines in blood pressure. Medications prescribed propranolol (INDERAL) 20 MG tablet . 02.23.2022 [name], NP (PCP) Patient present for acute visit for worsening migraines. Medication changes Lyrica increase from 50 mg twice daily to 75 mg twice daily.  Recent consult visits:  . None  Hospital visits:  None in previous 6 months  Medications: Outpatient Encounter Medications as of 07/07/2020  Medication Sig  . Ascorbic Acid (VITAMIN C PO) Take 750 mg by mouth daily.  (Patient not taking: Reported on 07/06/2020)  . Blood Glucose Monitoring Suppl ([place]) w/Device KIT Test once daily. Dx: E11.9  . Cholecalciferol (VITAMIN D3) 125 MCG (5000 UT) TABS Take 1 tablet by mouth daily.  (Patient not taking: Reported on 07/06/2020)  . cyclobenzaprine (FLEXERIL) 10 MG tablet TAKE 1 TABLET BY MOUTH EVERY 8 HOURS AS NEEDED FOR MUSCLE SPASM  . doxycycline (VIBRAMYCIN) 100 MG capsule Take 100 mg by mouth every 12 (twelve) hours.  . Ferrous Sulfate (IRON PO) Take 65 mg by mouth daily.  (Patient not taking: Reported on 07/06/2020)  . glipiZIDE (GLUCOTROL XL) 2.5 MG 24 hr tablet Take 1 tablet by mouth once daily with breakfast  . glucose blood test strip Use as instructed  . lisinopril (ZESTRIL) 2.5 MG tablet Take 1 tablet (2.5 mg total) by mouth daily.  . Multiple Vitamin (MULTIVITAMIN ADULT PO) Take 1 tablet by mouth daily. (Patient not taking: Reported on 07/06/2020)  . OneTouch Delica Lancets 63J MISC Check blood sugars twice a day  . OVER THE COUNTER MEDICATION Apply 1 application topically as needed. Theragesic Cream.  Applying to neck & back.  Marland Kitchen PROAIR HFA 108 (90 Base) MCG/ACT inhaler Inhale 2 puffs into the  lungs every 4 (four) hours as needed for wheezing or shortness of breath.  . rizatriptan (MAXALT) 5 MG tablet Take 1 tablet (5 mg total) by mouth as needed.  . vitamin B-12 (CYANOCOBALAMIN) 1000 MCG tablet Take 1,000 mcg by mouth daily. (Patient not taking: Reported on 07/06/2020)  . Vitamin D, Ergocalciferol, (DRISDOL) 1.25 MG (50000 UNIT) CAPS capsule Take 1 capsule (50,000 Units total) by mouth every 7 (seven) days. (Patient not taking: Reported on 07/06/2020)  . zinc gluconate 50 MG tablet Take 50 mg by mouth daily. (Patient not taking: Reported on 07/06/2020)   No facility-administered encounter medications on file as of 07/07/2020.   I spoke with the patient about medication adherence. She stated that she has just started back to work. She has been out for some time due to a finger injury. She states that she has been recording her blood sugar when she feels elevated. It was suggested that she get a notebook and try to record her blood sugars to help to see how consistent they are. She understood. She also wanted to see if there is a way that she could get help with a new meter. There have been no current changes to her medications. She states she is taking her medication as prescribed. There are no recent emergency department or urgent care visits. She states that she is not having any issues with her current pharmacy. A new CCM appointment was scheduled for July.  Star Rating Drugs: Medication Dispensed Quantity Pharmacy  Lisinopril 2.5 mg 05.20.2022 [address] [facility]    Amilia Revonda Standard, [place] Pharmacist Assistant [phone]

## 2020-07-20 ENCOUNTER — Ambulatory Visit (AMBULATORY_SURGERY_CENTER): Payer: PPO | Admitting: Internal Medicine

## 2020-07-20 ENCOUNTER — Encounter: Payer: Self-pay | Admitting: Internal Medicine

## 2020-07-20 ENCOUNTER — Other Ambulatory Visit: Payer: Self-pay

## 2020-07-20 VITALS — BP 140/81 | HR 74 | Temp 97.8°F | Resp 19 | Ht 67.0 in | Wt 226.0 lb

## 2020-07-20 DIAGNOSIS — K635 Polyp of colon: Secondary | ICD-10-CM | POA: Diagnosis not present

## 2020-07-20 DIAGNOSIS — E119 Type 2 diabetes mellitus without complications: Secondary | ICD-10-CM | POA: Diagnosis not present

## 2020-07-20 DIAGNOSIS — Z86010 Personal history of colonic polyps: Secondary | ICD-10-CM | POA: Diagnosis not present

## 2020-07-20 DIAGNOSIS — D120 Benign neoplasm of cecum: Secondary | ICD-10-CM

## 2020-07-20 DIAGNOSIS — M797 Fibromyalgia: Secondary | ICD-10-CM | POA: Diagnosis not present

## 2020-07-20 DIAGNOSIS — J45909 Unspecified asthma, uncomplicated: Secondary | ICD-10-CM | POA: Diagnosis not present

## 2020-07-20 DIAGNOSIS — I10 Essential (primary) hypertension: Secondary | ICD-10-CM | POA: Diagnosis not present

## 2020-07-20 MED ORDER — SODIUM CHLORIDE 0.9 % IV SOLN: 500.0000 mL | Freq: Once | INTRAVENOUS | Status: DC

## 2020-07-20 NOTE — Op Note (Signed)
[facility] Patient Name: Karen Hardin Procedure Date: 07/20/2020 7:33 AM MRN: [id number] Endoscopist: Jerene Bears , MD Age: 55 Referring MD:  Date of Birth: 12-Nov-1965 Gender: Female Account #: [id number] Procedure:                Colonoscopy Indications:              High risk colon cancer surveillance: Personal                            history of sessile serrated colon polyp (less than                            10 mm in size) with no dysplasia, Last colonoscopy:                            2016 Medicines:                Monitored Anesthesia Care Procedure:                Pre-Anesthesia Assessment:                           - Prior to the procedure, a History and Physical                            was performed, and patient medications and                            allergies were reviewed. The patient's tolerance of                            previous anesthesia was also reviewed. The risks                            and benefits of the procedure and the sedation                            options and risks were discussed with the patient.                            All questions were answered, and informed consent                            was obtained. Prior Anticoagulants: The patient has                            taken no previous anticoagulant or antiplatelet                            agents. ASA Grade Assessment: III - A patient with                            severe systemic disease. After reviewing the risks  and benefits, the patient was deemed in                            satisfactory condition to undergo the procedure.                           After obtaining informed consent, the colonoscope                            was passed under direct vision. Throughout the                            procedure, the patient's blood pressure, pulse, and                            oxygen saturations were monitored continuously. The                             Olympus PCF-H190DL (#[id number]) Colonoscope was                            introduced through the anus and advanced to the                            cecum, identified by appendiceal orifice and                            ileocecal valve. The colonoscopy was performed                            without difficulty. The patient tolerated the                            procedure well. The quality of the bowel                            preparation was good. The ileocecal valve,                            appendiceal orifice, and rectum were photographed. Scope In: 8:34:18 AM Scope Out: 8:52:43 AM Scope Withdrawal Time: 0 hours 13 minutes 47 seconds  Total Procedure Duration: 0 hours 18 minutes 25 seconds  Findings:                 The digital rectal exam was normal.                           A 3 mm polyp was found in the cecum. The polyp was                            sessile. The polyp was removed with a cold snare.                            Resection and retrieval were complete.  The exam was otherwise without abnormality on                            direct and retroflexion views. Complications:            No immediate complications. Estimated Blood Loss:     Estimated blood loss was minimal. Impression:               - One 3 mm polyp in the cecum, removed with a cold                            snare. Resected and retrieved.                           - The examination was otherwise normal on direct                            and retroflexion views. Recommendation:           - Patient has a contact number available for                            emergencies. The signs and symptoms of potential                            delayed complications were discussed with the                            patient. Return to normal activities tomorrow.                            Written discharge instructions were provided to the                             patient.                           - Resume previous diet.                           - Continue present medications.                           - Await pathology results.                           - Repeat colonoscopy is recommended for                            surveillance. The colonoscopy date will be                            determined after pathology results from today's                            exam become available for review. Jerene Bears, MD 07/20/2020 8:55:07 AM This report has been  signed electronically.

## 2020-07-20 NOTE — Progress Notes (Signed)
Report to PACU, RN, vss, BBS= Clear.  

## 2020-07-20 NOTE — Patient Instructions (Signed)
HANDOUTS PROVIDED ON: POLYPS  The polyp removed today have been sent for pathology.  The results can take 1-3 weeks to receive.  When your next colonoscopy should occur will be based on the pathology results.    You may resume your previous diet and medication schedule.  Thank you for allowing [place] to care for you today!!!   YOU HAD AN ENDOSCOPIC PROCEDURE TODAY AT [facility]:   Refer to the procedure report that was given to you for any specific questions about what was found during the examination.  If the procedure report does not answer your questions, please call your gastroenterologist to clarify.  If you requested that your care partner not be given the details of your procedure findings, then the procedure report has been included in a sealed envelope for you to review at your convenience later.  YOU SHOULD EXPECT: Some feelings of bloating in the abdomen. Passage of more gas than usual.  Walking can help get rid of the air that was put into your GI tract during the procedure and reduce the bloating. If you had a lower endoscopy (such as a colonoscopy or flexible sigmoidoscopy) you may notice spotting of blood in your stool or on the toilet paper. If you underwent a bowel prep for your procedure, you may not have a normal bowel movement for a few days.  Please Note:  You might notice some irritation and congestion in your nose or some drainage.  This is from the oxygen used during your procedure.  There is no need for concern and it should clear up in a day or so.  SYMPTOMS TO REPORT IMMEDIATELY:   Following lower endoscopy (colonoscopy or flexible sigmoidoscopy):  Excessive amounts of blood in the stool  Significant tenderness or worsening of abdominal pains  Swelling of the abdomen that is new, acute  Fever of 100F or higher  For urgent or emergent issues, a gastroenterologist can be reached at any hour by calling [phone]. Do not use MyChart messaging for  urgent concerns.    DIET:  We do recommend a small meal at first, but then you may proceed to your regular diet.  Drink plenty of fluids but you should avoid alcoholic beverages for 24 hours.  ACTIVITY:  You should plan to take it easy for the rest of today and you should NOT DRIVE or use heavy machinery until tomorrow (because of the sedation medicines used during the test).    FOLLOW UP: Our staff will call the number listed on your records Friday morning between 7:15 am and 8:15 am following your procedure to check on you and address any questions or concerns that you may have regarding the information given to you following your procedure. If we do not reach you, we will leave a message.  We will attempt to reach you two times.  During this call, we will ask if you have developed any symptoms of COVID 19. If you develop any symptoms (ie: fever, flu-like symptoms, shortness of breath, cough etc.) before then, please call [phone].  If you test positive for Covid 19 in the 2 weeks post procedure, please call and report this information to [place].    If any biopsies were taken you will be contacted by phone or by letter within the next 1-3 weeks.  Please call us at [phone] if you have not heard about the biopsies in 3 weeks.    SIGNATURES/CONFIDENTIALITY: You and/or your care partner have signed  paperwork which will be entered into your electronic medical record.  These signatures attest to the fact that that the information above on your After Visit Summary has been reviewed and is understood.  Full responsibility of the confidentiality of this discharge information lies with you and/or your care-partner.

## 2020-07-20 NOTE — Progress Notes (Signed)
Called to room to assist during endoscopic procedure.  Patient ID and intended procedure confirmed with present staff. Received instructions for my participation in the procedure from the performing physician.  

## 2020-07-20 NOTE — Progress Notes (Addendum)
Pt's states no medical or surgical changes since previsit or office visit.   VS taken by CW 

## 2020-07-22 ENCOUNTER — Encounter: Payer: Self-pay | Admitting: Internal Medicine

## 2020-07-22 ENCOUNTER — Telehealth: Payer: Self-pay | Admitting: *Deleted

## 2020-07-22 NOTE — Telephone Encounter (Signed)
  Follow up Call-  Call back number 07/20/2020  Post procedure Call Back phone  # [phone]  Permission to leave phone message Yes  Some recent data might be hidden     Patient questions:  Do you have a fever, pain , or abdominal swelling? No. Pain Score  0 *  Have you tolerated food without any problems? Yes.    Have you been able to return to your normal activities? Yes  Do you have any questions about your discharge instructions: Diet   No. Medications  No. Follow up visit  No.  Do you have questions or concerns about your Care? No.  Actions: * If pain score is 4 or above: No action needed, pain <4.  Have you developed a fever since your procedure? no  2.   Have you had an respiratory symptoms (SOB or cough) since your procedure? no  3.   Have you tested positive for COVID 19 since your procedure no  4.   Have you had any family members/close contacts diagnosed with the COVID 19 since your procedure?  no   If yes to any of these questions please route to Joylene John, RN and Joella Prince, RN

## 2020-07-29 ENCOUNTER — Telehealth: Payer: Self-pay | Admitting: Adult Health

## 2020-07-29 NOTE — Telephone Encounter (Signed)
Left message for patient to call back and schedule Medicare Annual Wellness Visit (AWV) either virtually or in office.   AWV-I per [place] 02/12/09 please schedule at anytime with [facility] Nurse Health Advisor 1 or 2   This should be a 45 minute visit. 

## 2020-08-23 ENCOUNTER — Telehealth: Payer: Self-pay | Admitting: Adult Health

## 2020-08-23 NOTE — Telephone Encounter (Signed)
Left message for patient to call back and schedule Medicare Annual Wellness Visit (AWV) either virtually or in office.   AWV-I per [place] 02/12/09 please schedule at anytime with [facility] Nurse Health Advisor 1 or 2   This should be a 45 minute visit. 

## 2020-09-05 ENCOUNTER — Telehealth: Payer: Self-pay | Admitting: Pharmacist

## 2020-09-05 NOTE — Chronic Care Management (AMB) (Signed)
Date- Patient called to remind of appointment with Watt Climes on 07.26.2022 at 2:00 pm  No answer, left message of appointment date, time and type of appointment ( telephone ). Left message to have all medications, supplements, blood pressure and/or blood sugar logs available during appointment and to return call if need to reschedule. has any PAP medications ask if they are having any problems getting their PAP medication or refill?  Care Gaps: COVID-19 Hep C screening Pneumococcal Vaccine Zoster Vaccines Foot Exam  Star Rating Drug: Medication Dispensed Quantity Pharmacy  Lisinopril 2.5 mg 05.20.2022 [facility]    Any gaps in medications fill history? No  Maia Breslow, [place] Pharmacist Assistant [phone]

## 2020-09-06 ENCOUNTER — Telehealth: Payer: PPO

## 2020-09-06 ENCOUNTER — Telehealth: Payer: Self-pay | Admitting: Pharmacist

## 2020-09-06 NOTE — Progress Notes (Deleted)
Chronic Care Management Pharmacy Note  09/06/2020 Name:  Karen Hardin MRN:  [id number] DOB:  Nov 01, 1965  Summary: ***  Recommendations/Changes made from today's visit: ***  Plan: ***   Subjective: Karen Hardin is an 55 y.o. year old female who is a primary patient of [name], NP.  The CCM team was consulted for assistance with disease management and care coordination needs.    Engaged with patient by telephone for follow up visit in response to provider referral for pharmacy case management and/or care coordination services.   Consent to Services:  The patient was given information about Chronic Care Management services, agreed to services, and gave verbal consent prior to initiation of services.  Please see initial visit note for detailed documentation.   Patient Care Team: [name], NP as PCP - General (Family Medicine) Earnie Larsson, [facility] as Pharmacist (Pharmacist)  Recent office visits: 04/19/20 [name], NP: Patient presented for migraines and HTN follow up. Trial of Inderal 20 mg BID for migraine prophylaxis. Provided toradol injection in office.  04/06/20 [name], NP: Patient presented for migraines. Patient stopped lisinopril on her own. Represcribed lisinopril 2.5 mg daily and increased Lyrica to 75 mg BID.  Recent consult visits: 07/20/20 Patient presented for colonoscopy.  Hospital visits: None in previous 6 months   Objective:  Lab Results  Component Value Date   CREATININE 0.75 10/16/2019   BUN 11 10/16/2019   GFR 97.24 04/23/2019   GFRNONAA 90 10/16/2019   GFRAA 104 10/16/2019   NA 141 10/16/2019   K 3.7 10/16/2019   CALCIUM 9.7 10/16/2019   CO2 24 10/16/2019   GLUCOSE 123 (H) 10/16/2019    Lab Results  Component Value Date/Time   HGBA1C 6.6 (A) 04/19/2020 03:40 PM   HGBA1C 6.8 (A) 10/16/2019 01:55 PM   HGBA1C CANCELED 10/16/2019 01:28 PM   HGBA1C 6.3 07/17/2019 10:06 AM   HGBA1C 6.9 (H) 04/23/2019  09:01 AM   HGBA1C 6.0 02/20/2018 07:06 AM   GFR 97.24 04/23/2019 09:01 AM   GFR 109.18 08/06/2018 11:25 AM    Last diabetic Eye exam:  Lab Results  Component Value Date/Time   HMDIABEYEEXA No Retinopathy 12/23/2019 12:00 AM    Last diabetic Foot exam: No results found for: HMDIABFOOTEX   Lab Results  Component Value Date   CHOL 155 10/16/2019   HDL 54 10/16/2019   LDLCALC 84 10/16/2019   TRIG 82 10/16/2019   CHOLHDL 2.9 10/16/2019    Hepatic Function Latest Ref Rng & Units 10/16/2019 08/06/2018 03/29/2017  Total Protein 6.1 - 8.1 g/dL 6.7 6.8 6.8  Albumin 3.5 - 5.2 g/dL - 4.2 4.1  AST 10 - 35 U/L '14 13 12  ' ALT 6 - 29 U/L '18 12 11  ' Alk Phosphatase 39 - 117 U/L - 80 84  Total Bilirubin 0.2 - 1.2 mg/dL 0.2 0.5 0.5  Bilirubin, Direct 0.0 - 0.3 mg/dL - - 0.1    Lab Results  Component Value Date/Time   TSH 0.67 10/16/2019 01:28 PM   TSH 0.69 08/06/2018 11:25 AM    CBC Latest Ref Rng & Units 10/16/2019 04/23/2019 08/06/2018  WBC - CANCELED 6.5 8.3  Hemoglobin 12.0 - 15.0 g/dL - 13.5 12.5  Hematocrit 36.0 - 46.0 % - 40.7 38.3  Platelets 150.0 - 400.0 K/uL - 279.0 296.0    Lab Results  Component Value Date/Time   VD25OH 26 (L) 10/16/2019 01:28 PM   VD25OH 48.03 08/06/2018 11:25 AM   VD25OH 22.04 (L) 02/18/2018  04:34 PM    Clinical ASCVD: {YES/NO:21197} The 10-year ASCVD risk score Mikey Bussing DC Jr., et al., 2013) is: 23.3%   Values used to calculate the score:     Age: 35 years     Sex: Female     Is Non-Hispanic African American: Yes     Diabetic: Yes     Tobacco smoker: Yes     Systolic Blood Pressure: 440 mmHg     Is BP treated: Yes     HDL Cholesterol: 54 mg/dL     Total Cholesterol: 155 mg/dL    Depression screen PHQ 2/9 11/27/2019  Decreased Interest 0  Down, Depressed, Hopeless 0  PHQ - 2 Score 0     ***Other: (CHADS2VASc if Afib, MMRC or CAT for COPD, ACT, DEXA)  Social History   Tobacco Use  Smoking Status Some Days   Packs/day: 0.10   Years: 20.00    Pack years: 2.00   Types: Cigarettes   Start date: 02/28/2015  Smokeless Tobacco Never   BP Readings from Last 3 Encounters:  07/20/20 140/81  04/19/20 132/78  04/06/20 (!) 160/98   Pulse Readings from Last 3 Encounters:  07/20/20 74  04/19/20 96  11/04/19 82   Wt Readings from Last 3 Encounters:  07/20/20 226 lb (102.5 kg)  07/06/20 226 lb (102.5 kg)  04/19/20 225 lb 6 oz (102.2 kg)   BMI Readings from Last 3 Encounters:  07/20/20 35.40 kg/m  07/06/20 35.40 kg/m  04/19/20 34.27 kg/m    Assessment/Interventions: Review of patient past medical history, allergies, medications, health status, including review of consultants reports, laboratory and other test data, was performed as part of comprehensive evaluation and provision of chronic care management services.   SDOH:  (Social Determinants of Health) assessments and interventions performed: {yes/no:20286}  SDOH Screenings   Alcohol Screen: Not on file  Depression (PHQ2-9): Low Risk    PHQ-2 Score: 0  Financial Resource Strain: Not on file  Food Insecurity: Not on file  Housing: Not on file  Physical Activity: Not on file  Social Connections: Not on file  Stress: Not on file  Tobacco Use: High Risk   Smoking Tobacco Use: Some Days   Smokeless Tobacco Use: Never  Transportation Needs: Not on file    [place]  No Known Allergies  Medications Reviewed Today     Reviewed by Jerene Bears, MD (Physician) on 07/20/20 at [phone]  Med List Status: <None>   Medication Order Taking? Sig Documenting Provider Last Dose Status Informant  0.9 %  sodium chloride infusion [id number]   Pyrtle, Lajuan Lines, MD  Active   Ascorbic Acid (VITAMIN C PO) [id number] No Take 750 mg by mouth daily.   Patient not taking: No sig reported   [provider] Unknown Active   Blood Glucose Monitoring Suppl (Com[place]­o) w/Device KIT [id number] Yes Test once daily. Dx: E11.9 [name], NP 07/19/2020 Active    Cholecalciferol (VITAMIN D3) 125 MCG (5000 UT) TABS [id number] No Take 1 tablet by mouth daily.   Patient not taking: No sig reported   [provider] Unknown Active   cyclobenzaprine (FLEXERIL) 10 MG tablet [id number] No TAKE 1 TABLET BY MOUTH EVERY 8 HOURS AS NEEDED FOR MUSCLE SPASM Nafziger, Tommi Rumps, NP Unknown Active   doxycycline (VIBRAMYCIN) 100 MG capsule [id number] No Take 100 mg by mouth every 12 (twelve) hours. [provider] Unknown Active   Ferrous Sulfate (IRON PO) [id number] No Take 65 mg by  mouth daily.   Patient not taking: No sig reported   [provider] Unknown Active   glipiZIDE (GLUCOTROL XL) 2.5 MG 24 hr tablet [id number] No Take 1 tablet by mouth once daily with breakfast [name], NP 07/18/2020 Active   glucose blood test strip [id number] Yes Use as instructed [name], NP 07/19/2020 Active   lisinopril (ZESTRIL) 2.5 MG tablet [id number] No Take 1 tablet (2.5 mg total) by mouth daily. [name], NP 07/18/2020 Active   Multiple Vitamin (MULTIVITAMIN ADULT PO) [id number] No Take 1 tablet by mouth daily.  Patient not taking: No sig reported   [provider] Unknown Active   OneTouch Delica Lancets 77A MISC [id number] Yes Check blood sugars twice a day [name], NP 07/19/2020 Active   OVER THE COUNTER MEDICATION [id number] No Apply 1 application topically as needed. Theragesic Cream.  Applying to neck & back. [provider] Unknown Active Self  pregabalin (LYRICA) 75 MG capsule [id number] No Take 1 capsule (75 mg total) by mouth 2 (two) times daily.  Patient taking differently: Take 75 mg by mouth 2 (two) times daily. Pt reports daily   [name], NP 07/18/2020 Expired 05/06/20 2359 Self  PROAIR HFA 108 (90 Base) MCG/ACT inhaler [id number] No Inhale 2 puffs into the lungs every 4 (four) hours as needed for wheezing or shortness of breath. Nafziger, Tommi Rumps, NP Unknown Active   propranolol (INDERAL) 20 MG tablet [id number] No  Take 1 tablet (20 mg total) by mouth 2 (two) times daily.  Patient not taking: No sig reported   [name], NP Unknown Expired 05/19/20 2359   rizatriptan (MAXALT) 5 MG tablet [id number] No Take 1 tablet (5 mg total) by mouth as needed. Nafziger, Tommi Rumps, NP Unknown Active   vitamin B-12 (CYANOCOBALAMIN) 1000 MCG tablet [id number] No Take 1,000 mcg by mouth daily.  Patient not taking: No sig reported   [provider] Unknown Active   Vitamin D, Ergocalciferol, (DRISDOL) 1.25 MG (50000 UNIT) CAPS capsule [id number] No Take 1 capsule (50,000 Units total) by mouth every 7 (seven) days.  Patient not taking: No sig reported   [name], NP Unknown Active   zinc gluconate 50 MG tablet [id number] No Take 50 mg by mouth daily.  Patient not taking: No sig reported   [provider] Unknown Active             Patient Active Problem List   Diagnosis Date Noted   Vitamin D deficiency 08/06/2018   Vitamin B 12 deficiency 08/06/2018   Iron deficiency anemia 08/06/2018   Diabetes type 2, uncontrolled ([place]) 09/22/2015   Asthma, chronic 05/11/2014   Legal blindness 05/11/2014   Chronic back pain 05/11/2014   Migraines 05/11/2014   Stargardt's disease    Menorrhagia 09/11/2011    Immunization History  Administered Date(s) Administered   Influenza,inj,Quad PF,6+ Mos 12/27/2012, 03/08/2015, 01/24/2016, 03/29/2017, 11/20/2018   Pneumococcal Polysaccharide-23 12/27/2012   Tdap 01/24/2016    Conditions to be addressed/monitored:  Diabetes, Asthma, and Migrains, Fibromyalgia, Vitamin D deficiency, Vitamin B12 deficiency  There are no care plans that you recently modified to display for this patient.   Current Barriers:  {pharmacybarriers:24917}  Pharmacist Clinical Goal(s):  Patient will {PHARMACYGOALCHOICES:24921} through collaboration with PharmD and provider.   Interventions: 1:1 collaboration with [name], NP regarding development and update of  comprehensive plan of care as evidenced by provider attestation and co-signature Inter-disciplinary care team collaboration (see longitudinal plan of care) Comprehensive medication review performed; medication list updated  in electronic medical record  Lab Results  Component Value Date   CHOL 155 10/16/2019   HDL 54 10/16/2019   LDLCALC 84 10/16/2019   TRIG 82 10/16/2019   CHOLHDL 2.9 10/16/2019   Kidney protection (BP goal <130/80) -{US controlled/uncontrolled:25276} -Current treatment: Lisinopril 2.5 mg 1 tablet daily -Medications previously tried: ***  -Current home readings: *** -Current dietary habits: *** -Current exercise habits: *** -{ACTIONS;DENIES/REPORTS:21021675} hypotensive/hypertensive symptoms -Educated on {CCM BP Counseling:25124} -Counseled to monitor BP at home ***, document, and provide log at future appointments -{CCMPHARMDINTERVENTION:25122}   Lipid Panel     Component Value Date/Time   CHOL 155 10/16/2019 1328   TRIG 82 10/16/2019 1328   HDL 54 10/16/2019 1328   CHOLHDL 2.9 10/16/2019 1328   VLDL 15.6 08/06/2018 1125   LDLCALC 84 10/16/2019 1328    The 10-year ASCVD risk score Mikey Bussing DC Jr., et al., 2013) is: 23.3%   Values used to calculate the score:     Age: 70 years     Sex: Female     Is Non-Hispanic African American: Yes     Diabetic: Yes     Tobacco smoker: Yes     Systolic Blood Pressure: 841 mmHg     Is BP treated: Yes     HDL Cholesterol: 54 mg/dL     Total Cholesterol: 155 mg/dL   Lab Results  Component Value Date   HGBA1C 6.6 (A) 04/19/2020    Diabetes (A1c goal <7%) -Controlled -Current medications: Glipizide ER 2.54m, 1 tablet daily with breakfast  -Medications previously tried: ***  -Current home glucose readings fasting glucose: *** post prandial glucose: *** -{ACTIONS;DENIES/REPORTS:21021675} hypoglycemic/hyperglycemic symptoms -Current meal patterns:  breakfast: ***  lunch: ***  dinner: *** snacks: *** drinks:  *** -Current exercise: *** -Educated on {CCM DM COUNSELING:25123} -Counseled to check feet daily and get yearly eye exams -{CCMPHARMDINTERVENTION:25122}  Asthma (Goal: control symptoms and prevent exacerbations) -{US controlled/uncontrolled:25276} -Current treatment  Proair HFA 109m/ act inhaler, inhale 2 puffs every four hours as needed for wheezing or shortness of breath -Medications previously tried: ***  -Gold Grade: {CHL HP Upstream Pharm COPD Gold GrYSAYT:[id number]}Current COPD Classification:  {CHL HP Upstream Pharm COPD Classification:[phone]} -MMRC/CAT score: *** -Pulmonary function testing: *** -Exacerbations requiring treatment in last 6 months: *** -Patient {Actions; denies-reports:120008} consistent use of maintenance inhaler -Frequency of rescue inhaler use: *** -Counseled on {CCMINHALERCOUNSELING:25121} -{CCMPHARMDINTERVENTION:25122}  Migraines (Goal: ***) -{US controlled/uncontrolled:25276} -Current treatment  Rizatriptan (Maxalt) 77m21m1 tablet as needed  -Medications previously tried: amitriptyline 98m81mCCMPHARMDINTERVENTION:25122}   Fibromyalgia (Goal: ***) -{US controlled/uncontrolled:25276} -Current treatment  Duloxetine 30mg61mcapsule once daily - did she restart? Theragesic cream, apply to neck and back  Flexeril 10mg,61mneeded (causes her drowsiness)  Lyrica 75 mg 1 capsule twice daily -Medications previously tried: ***  -{CCMPHARMDINTERVENTION:25122}  Anemia (Goal: ***) -{US controlled/uncontrolled:25276} -Current treatment  Ferrous sulfate 677mg, 66mblet once daily -Medications previously tried: ***  -{CCMPHARMDINTERVENTION:25122}  Vaccines   Reviewed and discussed patient's vaccination history.    Immunization History  Administered Date(s) Administered   Influenza,inj,Quad PF,6+ Mos 12/27/2012, 03/08/2015, 01/24/2016, 03/29/2017, 11/20/2018   Pneumococcal Polysaccharide-23 12/27/2012   Tdap 01/24/2016    Plan  Recommended  patient receive *** vaccine in *** office.    Health Maintenance -Vaccine gaps: shingrix, COVID? -Current therapy:  Cholecalciferol (vitamin D3), 3,000 units once daily Vitamin B-12 1000mcg, 67mblet once daily Ascorbic acid, 798mg onc87mily -Educated on {ccm supplement counseling:25128} -{CCM Patient satisfied:25129} -{CCMPHARMDINTERVENTION:25122}  Vitamin D - not  taking?  Last vitamin D Lab Results  Component Value Date   VD25OH 26 (L) 10/16/2019     Patient Goals/Self-Care Activities Patient will:  - {pharmacypatientgoals:24919}  Follow Up Plan: {CM FOLLOW UP YJZM:48389}   Medication Assistance: {MEDASSISTANCEINFO:25044}  Compliance/Adherence/Medication fill history: Care Gaps: ***  Star-Rating Drugs: Lisinopril 2.5 mg - last filled 07/01/20 for 90 ds [facility]  Patient's preferred pharmacy is:  [place] ([place] - [address] [place] ([place]) [place] [address] Phone: [phone] Fax: [phone]  Uses pill box? {Yes or If no, why not?:20788} Pt endorses ***% compliance  We discussed: {Pharmacy options:24294} Patient decided to: {US Pharmacy FZFT:08520}  Care Plan and Follow Up Patient Decision:  {FOLLOWUP:24991}  Plan: {CM FOLLOW UP NWNE:91859}  Jeni Salles, PharmD, [place] Pharmacist [place] at [place]

## 2020-09-06 NOTE — Telephone Encounter (Signed)
  Chronic Care Management   Outreach Note  09/06/2020 Name: AUBREI DUN MRN: [id number] DOB: 10/02/1965  Referred by: [name], NP  Patient had a phone appointment scheduled with clinical pharmacist today.  An unsuccessful telephone outreach was attempted today. The patient was referred to the pharmacist for assistance with care management and care coordination.   If possible, a message was left to return call to: [phone] or to [facility]: [place], PharmD, [place] at [place]

## 2020-09-13 ENCOUNTER — Other Ambulatory Visit: Payer: Self-pay | Admitting: Adult Health

## 2020-09-13 DIAGNOSIS — Z760 Encounter for issue of repeat prescription: Secondary | ICD-10-CM

## 2020-09-13 DIAGNOSIS — M2550 Pain in unspecified joint: Secondary | ICD-10-CM

## 2020-09-13 DIAGNOSIS — M7918 Myalgia, other site: Secondary | ICD-10-CM

## 2020-09-15 ENCOUNTER — Telehealth: Payer: Self-pay | Admitting: Adult Health

## 2020-09-15 NOTE — Telephone Encounter (Signed)
Correction   Voicemail full could not leave message

## 2020-09-15 NOTE — Telephone Encounter (Signed)
Left message for patient to call back and schedule Medicare Annual Wellness Visit (AWV) either virtually or in office.   AWV-I per [place] 02/12/09 please schedule at anytime with [facility] Nurse Health Advisor 1 or 2   This should be a 45 minute visit. 

## 2020-11-03 ENCOUNTER — Ambulatory Visit: Payer: PPO | Admitting: Adult Health

## 2020-11-03 ENCOUNTER — Other Ambulatory Visit: Payer: Self-pay

## 2020-11-04 ENCOUNTER — Encounter: Payer: Self-pay | Admitting: Adult Health

## 2020-11-04 ENCOUNTER — Ambulatory Visit (INDEPENDENT_AMBULATORY_CARE_PROVIDER_SITE_OTHER): Payer: PPO | Admitting: Adult Health

## 2020-11-04 VITALS — BP 110/70 | HR 89 | Temp 97.9°F | Ht 67.0 in | Wt 220.0 lb

## 2020-11-04 DIAGNOSIS — R079 Chest pain, unspecified: Secondary | ICD-10-CM

## 2020-11-04 DIAGNOSIS — G8929 Other chronic pain: Secondary | ICD-10-CM

## 2020-11-04 DIAGNOSIS — R0789 Other chest pain: Secondary | ICD-10-CM

## 2020-11-04 DIAGNOSIS — E1169 Type 2 diabetes mellitus with other specified complication: Secondary | ICD-10-CM | POA: Diagnosis not present

## 2020-11-04 DIAGNOSIS — I10 Essential (primary) hypertension: Secondary | ICD-10-CM

## 2020-11-04 DIAGNOSIS — M25512 Pain in left shoulder: Secondary | ICD-10-CM | POA: Diagnosis not present

## 2020-11-04 DIAGNOSIS — M797 Fibromyalgia: Secondary | ICD-10-CM

## 2020-11-04 LAB — POCT GLYCOSYLATED HEMOGLOBIN (HGB A1C): Hemoglobin A1C: 6.4 % — AB (ref 4.0–5.6)

## 2020-11-04 MED ORDER — LISINOPRIL 2.5 MG PO TABS: 2.5000 mg | ORAL_TABLET | Freq: Every day | ORAL | 1 refills | Status: DC

## 2020-11-04 MED ORDER — GLIPIZIDE ER 2.5 MG PO TB24: ORAL_TABLET | ORAL | 1 refills | Status: DC

## 2020-11-04 MED ORDER — DULOXETINE HCL 30 MG PO CPEP: 30.0000 mg | ORAL_CAPSULE | Freq: Every day | ORAL | 0 refills | Status: DC

## 2020-11-04 NOTE — Progress Notes (Signed)
Subjective:    Patient ID: Karen Hardin, female    DOB: 1965/06/28, 55 y.o.   MRN: [id number]  HPI 55 year old female who  has a past medical history of Allergy, Anemia, Arthritis, Asthma (2002), Depression, Diabetes mellitus without complication ([place]), Fibrocystic breast (2005), Fibromyalgia, GERD (gastroesophageal reflux disease), Hypertension, LGSIL (low grade squamous intraepithelial lesion) on Pap smear (2007), Libido, decreased (2010), Migraines, Neuromuscular disorder ([place]), Ovarian cyst, Stargardt's disease (1986), Tachycardia, Trichomonas, Vaginosis (2008), and Vitamin D deficiency.  She presents to the office today for multiple issues  DM II -66-monthfollow-up.  Currently prescribed glipizide 2.5 mg extended release.  She does monitor her blood sugars periodically and reports readings between 90 and 170.  Has not had any hypoglycemic events.  Her last A1c was 6.6. Left Shoulder Pain -[facility].  Since that time she has continued to have loss of range of motion and pain in her left shoulder.  She has been back to see Dr. MPercell Millerand a few months ago had a steroid injection.  She does report some short-lived decrease in discomfort but this only lasts for a short period of time.  She is interested in a second opinion and [facility] Fibromyalgia -has been seen by rheumatology in the past who prescribed her Lyrica 50 mg twice daily and then she was advised to follow-up with her PCP.  When she was last seen she was taking Lyrica 75 mg twice daily and had noticed some improvement but was not significant.  She had leftover Cymbalta from previous treatments she was advised to start taking that as well.  Patient reports that she has not been taking the Lyrica on a routine basis as when she was taking it twice a day it was making her sleepy and has not been taking Cymbalta on a  regular basis.  She continues to have fibromyalgia pain and muscular pain across the left chest that has been becoming more severe over the last couple weeks.  She does not exercise on a routine basis Migraine headaches-she was seen 6 months ago she was complaining of worsening migraine headaches.  We trialed her on Inderal 20 mg, she reports that her migraines have been better controlled since starting this medication, when she gets upset she will get a headache but has had very few migraine headaches.  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   Asthma 2002   uses inhaler as PRN   Depression    as a child, and stress recently   [place]    [place] meds   Fibrocystic breast 2005   Fibromyalgia    on meds   GERD (gastroesophageal reflux disease)    hx of   Hypertension    on meds   LGSIL (low grade squamous intraepithelial lesion) on Pap smear 2007   Libido, decreased 2010   Migraines    migraines - otc med prn, then maxalt if needed   Neuromuscular disorder ([place]    Ovarian cyst    Stargardt's disease 1986   Patient is legally blind  Tachycardia    per patient diagnosed in ER 2014, no problems currently   Trichomonas    Vaginosis 2008   Vitamin D deficiency    on meds    Social History   Socioeconomic History   Marital status: Married    Spouse name: Not on file   Number of children: Not on file   Years of education: Not on file   Highest education level: Not on file  Occupational History   Not on file  Tobacco Use   Smoking status: Some Days    Packs/day: 0.10    Years: 20.00    Pack years: 2.00    Types: Cigarettes    Start date: 02/28/2015   Smokeless tobacco: Never  Vaping Use   Vaping Use: Never used  Substance and Sexual  Activity   Alcohol use: No    Alcohol/week: 0.0 standard drinks   Drug use: No   Sexual activity: Yes    Birth control/protection: Surgical, Condom  Other Topics Concern   Not on file  Social History Narrative   Is on disability    Married for 21 years    Three boys ( all three live locally)       She likes to hang out with friends. 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    Social Determinants of Health   Financial Resource Strain: Not on file  Food Insecurity: Not on file  Transportation Needs: Not on file  Physical Activity: Not on file  Stress: Not on file  Social Connections: Not on file  Intimate Partner Violence: Not on file    Past Surgical History:  Procedure Laterality Date   BREAST EXCISIONAL BIOPSY Left    benign   BREAST SURGERY  1986   biopsy - benign   COLONOSCOPY  2016   JMP-MAC-suprep(good)-SSP   CYSTOSCOPY  12/26/2012   Procedure: CYSTOSCOPY;  Surgeon: Ena Dawley, MD;  Location: [facility];  Service: Gynecology;;   DILATION AND CURETTAGE OF UTERUS     ganglion cyst removed from wrist     left   LAPAROSCOPIC ASSISTED VAGINAL HYSTERECTOMY N/A 12/26/2012   Procedure: LAPAROSCOPIC ASSISTED VAGINAL HYSTERECTOMY Uterine Morcellation, Bilateral Salpingectomy, ;  Surgeon: Ena Dawley, MD;  Location: [facility];  Service: Gynecology;  Laterality: N/A;   MYOMECTOMY     In [place] - laparotomy   OOPHORECTOMY  2000   laparotomy in [place]   SHOULDER ARTHROSCOPY Left 2021   SHOULDER ARTHROSCOPY W/ ROTATOR CUFF REPAIR Right 2006   TONSILLECTOMY AND ADENOIDECTOMY     TUBAL LIGATION  2000   WISDOM TOOTH EXTRACTION     x 2 teeth   WISDOM TOOTH EXTRACTION      Family History  Problem Relation Age of Onset   Arthritis Mother    Diabetes Mother    Hypertension Mother    Pulmonary embolism Mother        Cardiac arrest    Diverticulitis Mother    Heart disease Father    Heart attack Father    Mesothelioma Father     Hyperlipidemia Maternal Grandmother    Breast cancer Maternal Grandmother    Colitis Paternal Aunt        in 56's   Stomach cancer Paternal Aunt    Heart attack Paternal Aunt    Heart attack Paternal Uncle    Stroke Paternal Aunt    Breast cancer Maternal Aunt    Colon polyps Sister 78  Diverticulitis Sister    Colon cancer Neg Hx    Esophageal cancer Neg Hx    Rectal cancer Neg Hx     No Known Allergies  Current Outpatient Medications on File Prior to Visit  Medication Sig Dispense Refill   Ascorbic Acid (VITAMIN C PO) Take 750 mg by mouth daily.     Blood Glucose Monitoring Suppl ([place]) w/Device KIT Test once daily. Dx: E11.9 1 each 0   Cholecalciferol (VITAMIN D3) 125 MCG (5000 UT) TABS Take 1 tablet by mouth daily.     cyclobenzaprine (FLEXERIL) 10 MG tablet TAKE 1 TABLET BY MOUTH EVERY 8 HOURS AS NEEDED FOR MUSCLE SPASM 30 tablet 0   Ferrous Sulfate (IRON PO) Take 65 mg by mouth daily.     glipiZIDE (GLUCOTROL XL) 2.5 MG 24 hr tablet Take 1 tablet by mouth once daily with breakfast 90 tablet 1   glucose blood test strip Use as instructed 200 each 3   lisinopril (ZESTRIL) 2.5 MG tablet Take 1 tablet (2.5 mg total) by mouth daily. 90 tablet 1   Multiple Vitamin (MULTIVITAMIN ADULT PO) Take 1 tablet by mouth daily.     OneTouch Delica Lancets 17C MISC Check blood sugars twice a day 200 each 3   OVER THE COUNTER MEDICATION Apply 1 application topically as needed. Theragesic Cream.  Applying to neck & back.     PROAIR HFA 108 (90 Base) MCG/ACT inhaler Inhale 2 puffs into the lungs every 4 (four) hours as needed for wheezing or shortness of breath. 18 g 2   rizatriptan (MAXALT) 5 MG tablet Take 1 tablet (5 mg total) by mouth as needed. 10 tablet 3   vitamin B-12 (CYANOCOBALAMIN) 1000 MCG tablet Take 1,000 mcg by mouth daily.     Vitamin D, Ergocalciferol, (DRISDOL) 1.25 MG (50000 UNIT) CAPS capsule Take 1 capsule (50,000 Units total) by mouth every 7 (seven) days.  12 capsule 0   zinc gluconate 50 MG tablet Take 50 mg by mouth daily.     pregabalin (LYRICA) 75 MG capsule Take 1 capsule (75 mg total) by mouth 2 (two) times daily. (Patient taking differently: Take 75 mg by mouth 2 (two) times daily. Pt reports daily) 60 capsule 2   propranolol (INDERAL) 20 MG tablet Take 1 tablet (20 mg total) by mouth 2 (two) times daily. (Patient not taking: No sig reported) 60 tablet 0   No current facility-administered medications on file prior to visit.    BP 110/70   Pulse 89   Temp 97.9 F (36.6 C) (Oral)   Ht 5' 7" (1.702 m)   Wt 220 lb (99.8 kg)   LMP 12/14/2012   SpO2 98%   BMI 34.46 kg/m       Objective:   Physical Exam Vitals and nursing note reviewed.  Constitutional:      Appearance: Normal appearance. She is obese.  Cardiovascular:     Rate and Rhythm: Normal rate and regular rhythm.     Pulses: Normal pulses.     Heart sounds: Normal heart sounds.  Pulmonary:     Effort: Pulmonary effort is normal.     Breath sounds: Normal breath sounds.  Musculoskeletal:     Right shoulder: Tenderness present. Normal strength.     Left shoulder: Tenderness present. Decreased range of motion. Decreased strength. Normal pulse.     Right upper arm: Tenderness present.     Left upper arm: Tenderness present.     Cervical back:  Tenderness present.     Thoracic back: Tenderness present.     Right lower leg: Tenderness present.     Left lower leg: Tenderness present.  Skin:    General: Skin is warm and dry.  Neurological:     General: No focal deficit present.     Mental Status: She is alert and oriented to person, place, and time.  Psychiatric:        Mood and Affect: Mood normal.        Behavior: Behavior normal.        Thought Content: Thought content normal.        Judgment: Judgment normal.       Assessment & Plan:  1. Muscular chest pain -Noncardiac chest pain.  More musculoskeletal.  Encouraged use of Cymbalta, stretching exercises and  physical activity.  Likely resulting from fibromyalgia - DULoxetine (CYMBALTA) 30 MG capsule; Take 1 capsule (30 mg total) by mouth daily.  Dispense: 90 capsule; Refill: 0 - EKG 12-Lead- Sinus Rhythm, Rate 83    2. Type 2 diabetes mellitus with other specified complication, without long-term current use of insulin (HCC)  - POC HgB A1c- 6.4 - has improved and at goal. No changes in medications   3. Chronic left shoulder pain  - AMB referral to orthopedics  4. Fibromyalgia -Have her take Cymbalta 30 mg and Lyrica 75 mg nightly.  Encouraged to start stretching and doing some physical exercise as this will be most helpful.  Follow-up in a month if no improvement - DULoxetine (CYMBALTA) 30 MG capsule; Take 1 capsule (30 mg total) by mouth daily.  Dispense: 90 capsule; Refill: 0

## 2020-11-04 NOTE — Patient Instructions (Signed)
Your A1c has improved to 6.4   I am going to refer you to see Dr. Veverly Fells for your left shoulder pain   Try taking the Cymbalta and Lyrica together at night   Ekg was normal

## 2020-11-23 ENCOUNTER — Telehealth: Payer: Self-pay | Admitting: Pharmacist

## 2020-11-23 NOTE — Progress Notes (Signed)
Chronic Care Management Pharmacy Assistant   Name: Karen Hardin  MRN: [id number] DOB: Feb 18, 1965   Reason for Encounter: General Assessment Call    Conditions to be addressed/monitored: DMII  Recent office visits:  11/04/2020 [name], NP - Patient presented for Muscular chest pain and other concerns. Prescribed Duloxetine HCL and Stopped Doxycycline Hyclate.   Recent consult visits:  07/20/2020 [facility] visits:  None in previous 6 months  Medications: Outpatient Encounter Medications as of 11/23/2020  Medication Sig   Ascorbic Acid (VITAMIN C PO) Take 750 mg by mouth daily.   Blood Glucose Monitoring Suppl ([place]) w/Device KIT Test once daily. Dx: E11.9   Cholecalciferol (VITAMIN D3) 125 MCG (5000 UT) TABS Take 1 tablet by mouth daily.   cyclobenzaprine (FLEXERIL) 10 MG tablet TAKE 1 TABLET BY MOUTH EVERY 8 HOURS AS NEEDED FOR MUSCLE SPASM   DULoxetine (CYMBALTA) 30 MG capsule Take 1 capsule (30 mg total) by mouth daily.   Ferrous Sulfate (IRON PO) Take 65 mg by mouth daily.   glipiZIDE (GLUCOTROL XL) 2.5 MG 24 hr tablet Take 1 tablet by mouth once daily with breakfast   glucose blood test strip Use as instructed   lisinopril (ZESTRIL) 2.5 MG tablet Take 1 tablet (2.5 mg total) by mouth daily.   Multiple Vitamin (MULTIVITAMIN ADULT PO) Take 1 tablet by mouth daily.   OneTouch Delica Lancets 79K MISC Check blood sugars twice a day   OVER THE COUNTER MEDICATION Apply 1 application topically as needed. Theragesic Cream.  Applying to neck & back.   pregabalin (LYRICA) 75 MG capsule Take 1 capsule (75 mg total) by mouth 2 (two) times daily. (Patient taking differently: Take 75 mg by mouth 2 (two) times daily. Pt reports daily)   PROAIR HFA 108 (90 Base) MCG/ACT inhaler Inhale 2 puffs into the lungs every 4 (four) hours as needed for wheezing or shortness of breath.   propranolol (INDERAL) 20 MG tablet Take 1 tablet (20 mg total) by  mouth 2 (two) times daily. (Patient not taking: No sig reported)   rizatriptan (MAXALT) 5 MG tablet Take 1 tablet (5 mg total) by mouth as needed.   vitamin B-12 (CYANOCOBALAMIN) 1000 MCG tablet Take 1,000 mcg by mouth daily.   Vitamin D, Ergocalciferol, (DRISDOL) 1.25 MG (50000 UNIT) CAPS capsule Take 1 capsule (50,000 Units total) by mouth every 7 (seven) days.   zinc gluconate 50 MG tablet Take 50 mg by mouth daily.   No facility-administered encounter medications on file as of 11/23/2020.   Recent Relevant Labs: Lab Results  Component Value Date/Time   HGBA1C 6.4 (A) 11/04/2020 11:02 AM   HGBA1C 6.6 (A) 04/19/2020 03:40 PM   HGBA1C CANCELED 10/16/2019 01:28 PM   HGBA1C 6.3 07/17/2019 10:06 AM   HGBA1C 6.9 (H) 04/23/2019 09:01 AM   HGBA1C 6.0 02/20/2018 07:06 AM    Kidney Function Lab Results  Component Value Date/Time   CREATININE 0.75 10/16/2019 01:28 PM   CREATININE 0.75 04/23/2019 09:01 AM   CREATININE 0.68 08/06/2018 11:25 AM   CREATININE 0.67 03/17/2015 12:02 PM   GFR 97.24 04/23/2019 09:01 AM   GFRNONAA 90 10/16/2019 01:28 PM   GFRAA 104 10/16/2019 01:28 PM    Current antihyperglycemic regimen:  Glipizide ER 2.61m, 1 tablet daily with breakfast  Lisinopril 2.574m 1 tablet once daily (for kidney protection)   Care Gaps: COVID Vaccines - Overdue Hepatitis C Screening - Overdue Zoster Vaccine - Overdue Foot Exam -  Overdue Flu Vaccine - Overdue HgB A1C - 6.4 AWV - Office aware to schedule CCM -   Star Rating Drugs: Lisinopril (Zestril) 2.5 mg  - Last filled 07/01/2020 90 DS at [facility] Glipizide (Glucotrol) 2.5 mg - Last filled 07/19/2020 90 DS at [facility] Call to [facility] and verified the above dates as correct  11/23/20 attempted to reach patient left vm 11/28/20 attempted to reach patient 12/06/20 attempted to reach patient she reports that she will be available for a call after 5 pm will re attempt next month  [place] Pharmacist  Assistant [phone]

## 2020-12-19 ENCOUNTER — Other Ambulatory Visit: Payer: Self-pay | Admitting: Adult Health

## 2020-12-19 DIAGNOSIS — Z1231 Encounter for screening mammogram for malignant neoplasm of breast: Secondary | ICD-10-CM

## 2020-12-21 ENCOUNTER — Telehealth: Payer: Self-pay | Admitting: Pharmacist

## 2020-12-21 NOTE — Chronic Care Management (AMB) (Signed)
Chronic Care Management Pharmacy Assistant   Name: Karen Hardin  MRN: [id number] DOB: [birth year]  Reason for Encounter: General Assessment Call    Conditions to be addressed/monitored: DMII   Recent office visits:  11/04/2020 Karen Peng, NP - Patient presented for Muscular chest pain and other concerns. Prescribed Duloxetine HCL and Stopped Doxycycline Hyclate.    Recent consult visits:  07/20/2020 [facility]oscopy [facility] visits:  None in previous 6 months   Medications: Outpatient Encounter Medications as of 12/21/2020  Medication Sig   Ascorbic Acid (VITAMIN C PO) Take 750 mg by mouth daily.   Blood Glucose Monitoring Suppl ([place]) w/Device KIT Test once daily. Dx: E11.9   Cholecalciferol (VITAMIN D3) 125 MCG (5000 UT) TABS Take 1 tablet by mouth daily.   cyclobenzaprine (FLEXERIL) 10 MG tablet TAKE 1 TABLET BY MOUTH EVERY 8 HOURS AS NEEDED FOR MUSCLE SPASM   DULoxetine (CYMBALTA) 30 MG capsule Take 1 capsule (30 mg total) by mouth daily.   Ferrous Sulfate (IRON PO) Take 65 mg by mouth daily.   glipiZIDE (GLUCOTROL XL) 2.5 MG 24 hr tablet Take 1 tablet by mouth once daily with breakfast   glucose blood test strip Use as instructed   lisinopril (ZESTRIL) 2.5 MG tablet Take 1 tablet (2.5 mg total) by mouth daily.   Multiple Vitamin (MULTIVITAMIN ADULT PO) Take 1 tablet by mouth daily.   OneTouch Delica Lancets 63W MISC Check blood sugars twice a day   OVER THE COUNTER MEDICATION Apply 1 application topically as needed. Theragesic Cream.  Applying to neck & back.   pregabalin (LYRICA) 75 MG capsule Take 1 capsule (75 mg total) by mouth 2 (two) times daily. (Patient taking differently: Take 75 mg by mouth 2 (two) times daily. Pt reports daily)   PROAIR HFA 108 (90 Base) MCG/ACT inhaler Inhale 2 puffs into the lungs every 4 (four) hours as needed for wheezing or shortness of breath.   propranolol (INDERAL) 20 MG tablet Take 1 tablet (20 mg total) by  mouth 2 (two) times daily. (Patient not taking: No sig reported)   rizatriptan (MAXALT) 5 MG tablet Take 1 tablet (5 mg total) by mouth as needed.   vitamin B-12 (CYANOCOBALAMIN) 1000 MCG tablet Take 1,000 mcg by mouth daily.   Vitamin D, Ergocalciferol, (DRISDOL) 1.25 MG (50000 UNIT) CAPS capsule Take 1 capsule (50,000 Units total) by mouth every 7 (seven) days.   zinc gluconate 50 MG tablet Take 50 mg by mouth daily.   No facility-administered encounter medications on file as of 12/21/2020.  Recent Relevant Labs: Lab Results  Component Value Date/Time   HGBA1C 6.4 (A) 11/04/2020 11:02 AM   HGBA1C 6.6 (A) 04/19/2020 03:40 PM   HGBA1C CANCELED 10/16/2019 01:28 PM   HGBA1C 6.3 07/17/2019 10:06 AM   HGBA1C 6.9 (H) 04/23/2019 09:01 AM   HGBA1C 6.0 02/20/2018 07:06 AM    Kidney Function Lab Results  Component Value Date/Time   CREATININE 0.75 10/16/2019 01:28 PM   CREATININE 0.75 04/23/2019 09:01 AM   CREATININE 0.68 08/06/2018 11:25 AM   CREATININE 0.67 03/17/2015 12:02 PM   GFR 97.24 04/23/2019 09:01 AM   GFRNONAA 90 10/16/2019 01:28 PM   GFRAA 104 10/16/2019 01:28 PM    Current antihyperglycemic regimen:  Glipizide ER 2.68m, 1 tablet daily with breakfast  Lisinopril 2.572m 1 tablet once daily (for kidney protection)    Care Gaps: COVID Vaccines - Overdue Hepatitis C Screening - Overdue Zoster Vaccine -  Overdue Foot Exam - Overdue Flu Vaccine - Overdue HgB A1C - 6.4 AWV - Office aware to schedule CCM - Need   Star Rating Drugs: Lisinopril (Zestril) 2.5 mg  - Last filled 07/01/2020 90 DS at [facility] Glipizide (Glucotrol) 2.5 mg - Last filled 07/19/2020 90 DS at [facility] Call to [facility] and verified the above dates as correct  12/27/20 Attempted to reach left vm  01/02/21 Attempted to reach 11/29 Attempted to reach   [place] Pharmacist Assistant [phone]

## 2020-12-29 DIAGNOSIS — E119 Type 2 diabetes mellitus without complications: Secondary | ICD-10-CM | POA: Diagnosis not present

## 2020-12-29 DIAGNOSIS — M542 Cervicalgia: Secondary | ICD-10-CM | POA: Diagnosis not present

## 2020-12-29 DIAGNOSIS — M25512 Pain in left shoulder: Secondary | ICD-10-CM | POA: Diagnosis not present

## 2020-12-29 DIAGNOSIS — H31003 Unspecified chorioretinal scars, bilateral: Secondary | ICD-10-CM | POA: Diagnosis not present

## 2020-12-29 DIAGNOSIS — H5213 Myopia, bilateral: Secondary | ICD-10-CM | POA: Diagnosis not present

## 2020-12-29 LAB — HM DIABETES EYE EXAM

## 2021-01-04 ENCOUNTER — Encounter: Payer: Self-pay | Admitting: Adult Health

## 2021-01-09 ENCOUNTER — Telehealth: Payer: Self-pay | Admitting: Adult Health

## 2021-01-09 NOTE — Telephone Encounter (Signed)
Left message for patient to call back and schedule Medicare Annual Wellness Visit (AWV) either virtually or in office. Left  my jabber number [phone] ° ° °AWV-I per [place] 02/12/09 °please schedule at anytime with [facility] Nurse Health Advisor 1 or 2 ° ° °This should be a 45 minute visit.  °

## 2021-01-12 DIAGNOSIS — R202 Paresthesia of skin: Secondary | ICD-10-CM | POA: Diagnosis not present

## 2021-01-16 DIAGNOSIS — M25512 Pain in left shoulder: Secondary | ICD-10-CM | POA: Diagnosis not present

## 2021-01-20 ENCOUNTER — Ambulatory Visit
Admission: RE | Admit: 2021-01-20 | Discharge: 2021-01-20 | Disposition: A | Discharge status: Home / Self Care | Payer: PPO | Source: Ambulatory Visit | Attending: Adult Health | Admitting: Adult Health

## 2021-01-20 ENCOUNTER — Other Ambulatory Visit: Payer: Self-pay

## 2021-01-20 DIAGNOSIS — Z1231 Encounter for screening mammogram for malignant neoplasm of breast: Secondary | ICD-10-CM

## 2021-01-24 DIAGNOSIS — M25512 Pain in left shoulder: Secondary | ICD-10-CM | POA: Diagnosis not present

## 2021-01-30 ENCOUNTER — Other Ambulatory Visit: Payer: Self-pay | Admitting: Adult Health

## 2021-01-30 DIAGNOSIS — E1169 Type 2 diabetes mellitus with other specified complication: Secondary | ICD-10-CM

## 2021-01-30 DIAGNOSIS — I10 Essential (primary) hypertension: Secondary | ICD-10-CM

## 2021-02-22 ENCOUNTER — Telehealth (INDEPENDENT_AMBULATORY_CARE_PROVIDER_SITE_OTHER): Payer: PPO | Admitting: Adult Health

## 2021-02-22 ENCOUNTER — Encounter: Payer: Self-pay | Admitting: Adult Health

## 2021-02-22 VITALS — Ht 67.0 in | Wt 220.0 lb

## 2021-02-22 DIAGNOSIS — G43809 Other migraine, not intractable, without status migrainosus: Secondary | ICD-10-CM

## 2021-02-22 MED ORDER — PROPRANOLOL HCL 40 MG PO TABS: 40.0000 mg | ORAL_TABLET | Freq: Every day | ORAL | 1 refills | Status: DC

## 2021-02-22 MED ORDER — RIZATRIPTAN BENZOATE 5 MG PO TABS: 5.0000 mg | ORAL_TABLET | ORAL | 3 refills | Status: DC | PRN

## 2021-02-22 NOTE — Progress Notes (Signed)
Virtual Visit via Video Note  I connected with Karen Hardin on 02/22/21 at  4:00 PM EST by a video enabled telemedicine application and verified that I am speaking with the correct person using two identifiers.  Location patient: home Location provider:work or home office Persons participating in the virtual visit: patient, provider  I discussed the limitations of evaluation and management by telemedicine and the availability of in person appointments. The patient expressed understanding and agreed to proceed.   HPI:  D60-year-old female who is being evaluated today for chronic migraines.  In the past we had pretty decent results with propanolol 20 mg, great rate rhythm and reduce the amount of headaches that she was experiencing.  Unfortunately, she has not been taking this medication daily and is almost out of the medication.  It was last found 10 in March 2022 for 30 days.  Reports that over the last week she has had a constant migraine headache and is also out of the Maxalt.  She believes that the migraine is due to stress.  Migraine feels as though it is her typical migraine.  Over-the-counter medications are not helping her  ROS: See pertinent positives and negatives per HPI.  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   Asthma 2002   uses inhaler as PRN   Depression    as a child, and stress recently   Diabetes mellitus without complication ([place])    on meds   Fibrocystic breast 2005   Fibromyalgia    on meds   GERD (gastroesophageal reflux disease)    hx of   Hypertension    on meds   LGSIL (low grade squamous intraepithelial lesion) on Pap smear 2007   Libido, decreased 2010   Migraines    migraines - otc med prn, then maxalt if needed   Neuromuscular disorder ([place])    Ovarian cyst    Stargardt's disease 1986   Patient is legally blind    Tachycardia    per patient diagnosed in ER 2014, no problems currently   Trichomonas    Vaginosis 2008   Vitamin D deficiency    on meds    Past Surgical History:  Procedure Laterality Date   BREAST EXCISIONAL BIOPSY Left    benign   BREAST SURGERY  1986   biopsy - benign   COLONOSCOPY  2016   JMP-MAC-suprep(good)-SSP   CYSTOSCOPY  12/26/2012   Procedure: CYSTOSCOPY;  Surgeon: Ena Dawley, MD;  Location: [facility];  Service: Gynecology;;   DILATION AND CURETTAGE OF UTERUS     ganglion cyst removed from wrist     left   LAPAROSCOPIC ASSISTED VAGINAL HYSTERECTOMY N/A 12/26/2012   Procedure: LAPAROSCOPIC ASSISTED VAGINAL HYSTERECTOMY Uterine Morcellation, Bilateral Salpingectomy, ;  Surgeon: Ena Dawley, MD;  Location: [facility];  Service: Gynecology;  Laterality: N/A;   MYOMECTOMY     In [place] - laparotomy   OOPHORECTOMY  2000   laparotomy in [place]   SHOULDER ARTHROSCOPY Left 2021   SHOULDER ARTHROSCOPY W/ ROTATOR CUFF REPAIR Right 2006   TONSILLECTOMY AND ADENOIDECTOMY     TUBAL LIGATION  2000   WISDOM TOOTH EXTRACTION     x 2 teeth   WISDOM TOOTH EXTRACTION      Family History  Problem Relation Age of Onset   Arthritis Mother  Diabetes Mother    Hypertension Mother    Pulmonary embolism Mother        Cardiac arrest    Diverticulitis Mother    Heart disease Father    Heart attack Father    Mesothelioma Father    Hyperlipidemia Maternal Grandmother    Breast cancer Maternal Grandmother    Colitis Paternal Aunt        in 79's   Stomach cancer Paternal Aunt    Heart attack Paternal Aunt    Heart attack Paternal Uncle    Stroke Paternal Aunt    Breast cancer Maternal Aunt    Colon polyps Sister 68   Diverticulitis Sister    Colon cancer Neg Hx    Esophageal cancer Neg Hx    Rectal cancer Neg Hx        Current Outpatient Medications:    Ascorbic Acid (VITAMIN C PO), Take 750 mg by mouth daily., Disp: , Rfl:    Blood Glucose Monitoring Suppl  ([place]) w/Device KIT, Test once daily. Dx: E11.9, Disp: 1 each, Rfl: 0   Cholecalciferol (VITAMIN D3) 125 MCG (5000 UT) TABS, Take 1 tablet by mouth daily., Disp: , Rfl:    cyclobenzaprine (FLEXERIL) 10 MG tablet, TAKE 1 TABLET BY MOUTH EVERY 8 HOURS AS NEEDED FOR MUSCLE SPASM, Disp: 30 tablet, Rfl: 0   DULoxetine (CYMBALTA) 30 MG capsule, Take 1 capsule (30 mg total) by mouth daily., Disp: 90 capsule, Rfl: 0   Ferrous Sulfate (IRON PO), Take 65 mg by mouth daily., Disp: , Rfl:    glipiZIDE (GLUCOTROL XL) 2.5 MG 24 hr tablet, Take 1 tablet by mouth once daily with breakfast, Disp: 90 tablet, Rfl: 0   glucose blood test strip, Use as instructed, Disp: 200 each, Rfl: 3   lisinopril (ZESTRIL) 2.5 MG tablet, Take 1 tablet by mouth once daily, Disp: 90 tablet, Rfl: 3   Multiple Vitamin (MULTIVITAMIN ADULT PO), Take 1 tablet by mouth daily., Disp: , Rfl:    OneTouch Delica Lancets 33L MISC, Check blood sugars twice a day, Disp: 200 each, Rfl: 3   OVER THE COUNTER MEDICATION, Apply 1 application topically as needed. Theragesic Cream.  Applying to neck & back., Disp: , Rfl:    PROAIR HFA 108 (90 Base) MCG/ACT inhaler, Inhale 2 puffs into the lungs every 4 (four) hours as needed for wheezing or shortness of breath., Disp: 18 g, Rfl: 2   rizatriptan (MAXALT) 5 MG tablet, Take 1 tablet (5 mg total) by mouth as needed., Disp: 10 tablet, Rfl: 3   vitamin B-12 (CYANOCOBALAMIN) 1000 MCG tablet, Take 1,000 mcg by mouth daily., Disp: , Rfl:    Vitamin D, Ergocalciferol, (DRISDOL) 1.25 MG (50000 UNIT) CAPS capsule, Take 1 capsule (50,000 Units total) by mouth every 7 (seven) days., Disp: 12 capsule, Rfl: 0   zinc gluconate 50 MG tablet, Take 50 mg by mouth daily., Disp: , Rfl:    pregabalin (LYRICA) 75 MG capsule, Take 1 capsule (75 mg total) by mouth 2 (two) times daily. (Patient taking differently: Take 75 mg by mouth 2 (two) times daily. Pt reports daily), Disp: 60 capsule, Rfl: 2   propranolol  (INDERAL) 20 MG tablet, Take 1 tablet (20 mg total) by mouth 2 (two) times daily. (Patient not taking: No sig reported), Disp: 60 tablet, Rfl: 0  EXAM:  VITALS per patient if applicable:  GENERAL: alert, oriented, appears well and in no acute distress  HEENT: atraumatic, conjunttiva clear, no obvious  abnormalities on inspection of external nose and ears  NECK: normal movements of the head and neck  LUNGS: on inspection no signs of respiratory distress, breathing rate appears normal, no obvious gross SOB, gasping or wheezing  CV: no obvious cyanosis  MS: moves all visible extremities without noticeable abnormality  PSYCH/NEURO: pleasant and cooperative, no obvious depression or anxiety, speech and thought processing grossly intact  ASSESSMENT AND PLAN:  Discussed the following assessment and plan:  1. Other migraine without status migrainosus, not intractable -We will send in propanolol, increase to 40 mg daily to see if we can get some better results for her.  Advised that we can increase this further to twice daily dosing if needed.  We will also send in [place].  Follow-up as needed - propranolol (INDERAL) 40 MG tablet; Take 1 tablet (40 mg total) by mouth daily.  Dispense: 90 tablet; Refill: 1 - rizatriptan (MAXALT) 5 MG tablet; Take 1 tablet (5 mg total) by mouth as needed.  Dispense: 10 tablet; Refill: 3   I discussed the assessment and treatment plan with the patient. The patient was provided an opportunity to ask questions and all were answered. The patient agreed with the plan and demonstrated an understanding of the instructions.   The patient was advised to call back or seek an in-person evaluation if the symptoms worsen or if the condition fails to improve as anticipated.   [name], NP

## 2021-02-26 ENCOUNTER — Other Ambulatory Visit: Payer: Self-pay

## 2021-02-26 ENCOUNTER — Encounter (HOSPITAL_COMMUNITY): Payer: Self-pay | Admitting: Emergency Medicine

## 2021-02-26 ENCOUNTER — Ambulatory Visit (HOSPITAL_COMMUNITY)
Admission: EM | Admit: 2021-02-26 | Discharge: 2021-02-26 | Disposition: A | Discharge status: Home / Self Care | Payer: PPO | Attending: Family Medicine | Admitting: Family Medicine

## 2021-02-26 DIAGNOSIS — G43011 Migraine without aura, intractable, with status migrainosus: Secondary | ICD-10-CM | POA: Diagnosis not present

## 2021-02-26 MED ORDER — KETOROLAC TROMETHAMINE 30 MG/ML IJ SOLN
INTRAMUSCULAR | Status: AC
  Filled 2021-02-26: qty 1

## 2021-02-26 MED ORDER — KETOROLAC TROMETHAMINE 30 MG/ML IJ SOLN
30.0000 mg | Freq: Once | INTRAMUSCULAR | Status: AC
  Administered 2021-02-26: 30 mg via INTRAMUSCULAR

## 2021-02-26 NOTE — ED Provider Notes (Signed)
[facility]    CSN: [id number] Arrival date & time: 02/26/21  1032      History   Chief Complaint Chief Complaint  Patient presents with   Headache    HPI Karen Karen Hardin is a 56 y.o. female.    Headache Here for headache that she has had approximately 2 weeks.  It is on her right side and throbbing.  This is mostly like her usual migraines.  No fever or chills recently.  She did have a video visit with her usual clinic last week.  They did send in new prescriptions for propanolol and for Maxalt, but she has not been able to pick those up yet.  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 Also DM.  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   Asthma 2002   uses inhaler as PRN   Depression    as a child, and stress recently   Diabetes mellitus without complication ([place])    on meds   Fibrocystic breast 2005   Fibromyalgia    on meds   GERD (gastroesophageal reflux disease)    hx of   Hypertension    on meds   LGSIL (low grade squamous intraepithelial lesion) on Pap smear 2007   Libido, decreased 2010   Migraines    migraines - otc med prn, then maxalt if needed   Neuromuscular disorder ([place])    Ovarian cyst    Stargardt's disease 1986   Patient is legally blind   Tachycardia    per patient diagnosed in ER 2014, no problems currently   Trichomonas    Vaginosis 2008   Vitamin D deficiency    on meds    Patient Active Problem List   Diagnosis Date Noted   Vitamin D deficiency 08/06/2018   Vitamin B 12 deficiency 08/06/2018   Iron deficiency anemia 08/06/2018   Diabetes type 2, uncontrolled 09/22/2015   Asthma, chronic 05/11/2014   Legal blindness 05/11/2014   Chronic back pain 05/11/2014   Migraines 05/11/2014   Stargardt's disease    Menorrhagia 09/11/2011    Past Surgical  History:  Procedure Laterality Date   BREAST EXCISIONAL BIOPSY Left    benign   BREAST SURGERY  1986   biopsy - benign   COLONOSCOPY  2016   JMP-MAC-suprep(good)-SSP   CYSTOSCOPY  12/26/2012   Procedure: CYSTOSCOPY;  Surgeon: Ena Dawley, MD;  Location: [facility];  Service: Gynecology;;   DILATION AND CURETTAGE OF UTERUS     ganglion cyst removed from wrist     left   LAPAROSCOPIC ASSISTED VAGINAL HYSTERECTOMY N/A 12/26/2012   Procedure: LAPAROSCOPIC ASSISTED VAGINAL HYSTERECTOMY Uterine Morcellation, Bilateral Salpingectomy, ;  Surgeon: Ena Dawley, MD;  Location: [facility];  Service: Gynecology;  Laterality: N/A;   MYOMECTOMY     In [place] - laparotomy   OOPHORECTOMY  2000   laparotomy in [place]   SHOULDER ARTHROSCOPY Left 2021   SHOULDER ARTHROSCOPY W/ ROTATOR CUFF REPAIR Right 2006   TONSILLECTOMY AND ADENOIDECTOMY     TUBAL LIGATION  2000   WISDOM TOOTH EXTRACTION     x 2 teeth   WISDOM TOOTH EXTRACTION      OB History     Gravida  3   Para  3  Term  3   Preterm      AB      Living  3      SAB      IAB      Ectopic      Multiple      Live Births  3            Home Medications    Prior to Admission medications   Medication Sig Start Date End Date Taking? Authorizing Provider  Cholecalciferol (VITAMIN D3) 125 MCG (5000 UT) TABS Take 1 tablet by mouth daily.   Yes [provider]  DULoxetine (CYMBALTA) 30 MG capsule Take 1 capsule (30 mg total) by mouth daily. 11/04/20  Yes Nafziger, Tommi Rumps, NP  glipiZIDE (GLUCOTROL XL) 2.5 MG 24 hr tablet Take 1 tablet by mouth once daily with breakfast 02/01/21  Yes Nafziger, Tommi Rumps, NP  lisinopril (ZESTRIL) 2.5 MG tablet Take 1 tablet by mouth once daily 02/01/21  Yes Nafziger, Tommi Rumps, NP  propranolol (INDERAL) 40 MG tablet Take 1 tablet (40 mg total) by mouth daily. 02/22/21 05/23/21 Yes Nafziger, Tommi Rumps, NP  Ascorbic Acid (VITAMIN C PO) Take 750 mg by mouth daily.    [provider]  Blood  Glucose Monitoring Suppl ([place]) w/Device KIT Test once daily. Dx: E11.9 01/27/16   Nafziger, Tommi Rumps, NP  cyclobenzaprine (FLEXERIL) 10 MG tablet TAKE 1 TABLET BY MOUTH EVERY 8 HOURS AS NEEDED FOR MUSCLE SPASM 09/14/20   Nafziger, Tommi Rumps, NP  Ferrous Sulfate (IRON PO) Take 65 mg by mouth daily.    [provider]  glucose blood test strip Use as instructed 04/23/19   Nafziger, Tommi Rumps, NP  Multiple Vitamin (MULTIVITAMIN ADULT PO) Take 1 tablet by mouth daily.    [provider]  OneTouch Delica Lancets 14E MISC Check blood sugars twice a day 04/23/19   [name], NP  OVER THE COUNTER MEDICATION Apply 1 application topically as needed. Theragesic Cream.  Applying to neck & back.    [provider]  pregabalin (LYRICA) 75 MG capsule Take 1 capsule (75 mg total) by mouth 2 (two) times daily. Patient taking differently: Take 75 mg by mouth 2 (two) times daily. Pt reports daily 04/06/20 05/06/20  [name], NP  PROAIR HFA 108 [phone] Base) MCG/ACT inhaler Inhale 2 puffs into the lungs every 4 (four) hours as needed for wheezing or shortness of breath. 05/07/19   Nafziger, Tommi Rumps, NP  rizatriptan (MAXALT) 5 MG tablet Take 1 tablet (5 mg total) by mouth as needed. 02/22/21   Nafziger, Tommi Rumps, NP  vitamin B-12 (CYANOCOBALAMIN) 1000 MCG tablet Take 1,000 mcg by mouth daily.    [provider]  Vitamin D, Ergocalciferol, (DRISDOL) 1.25 MG (50000 UNIT) CAPS capsule Take 1 capsule (50,000 Units total) by mouth every 7 (seven) days. 10/29/19   Nafziger, Tommi Rumps, NP  zinc gluconate 50 MG tablet Take 50 mg by mouth daily.    [provider]    Family History Family History  Problem Relation Age of Onset   Arthritis Mother    Diabetes Mother    Hypertension Mother    Pulmonary embolism Mother        Cardiac arrest    Diverticulitis Mother    Heart disease Father    Heart attack Father    Mesothelioma Father    Hyperlipidemia Maternal Grandmother    Breast  cancer Maternal Grandmother    Colitis Paternal Aunt        in 56's  Stomach cancer Paternal Aunt    Heart attack Paternal Aunt    Heart attack Paternal Uncle    Stroke Paternal Aunt    Breast cancer Maternal Aunt    Colon polyps Sister 75   Diverticulitis Sister    Colon cancer Neg Hx    Esophageal cancer Neg Hx    Rectal cancer Neg Hx     Social History Social History   Tobacco Use   Smoking status: Some Days    Packs/day: 0.10    Years: 20.00    Pack years: 2.00    Types: Cigarettes    Start date: 02/28/2015   Smokeless tobacco: Never  Vaping Use   Vaping Use: Never used  Substance Use Topics   Alcohol use: No    Alcohol/week: 0.0 standard drinks   Drug use: No     Allergies   Patient has no known allergies.   Review of Systems Review of Systems  Neurological:  Positive for headaches.    Physical Exam Triage Vital Signs ED Triage Vitals  Enc Vitals Group     BP 02/26/21 1105 138/79     Pulse Rate 02/26/21 1105 71     Resp 02/26/21 1105 16     Temp 02/26/21 1105 98.2 F (36.8 C)     Temp Source 02/26/21 1105 Oral     SpO2 02/26/21 1105 97 %     Weight --      Height --      Head Circumference --      Peak Flow --      Pain Score 02/26/21 1103 7     Pain Loc --      Pain Edu? --      Excl. in [place]? --    No data found.  Updated Vital Signs BP 138/79    Pulse 71    Temp 98.2 F (36.8 C) (Oral)    Resp 16    LMP 12/14/2012    SpO2 97%   Visual Acuity Right Eye Distance:   Left Eye Distance:   Bilateral Distance:    Right Eye Near:   Left Eye Near:    Bilateral Near:     Physical Exam Vitals reviewed.  Constitutional:      General: She is not in acute distress.    Appearance: She is not toxic-appearing.  HENT:     Right Ear: Tympanic membrane and ear canal normal.     Left Ear: Tympanic membrane and ear canal normal.     Nose: Nose normal.     Mouth/Throat:     Mouth: Mucous membranes are moist.     Pharynx: No oropharyngeal  exudate or posterior oropharyngeal erythema.  Eyes:     Extraocular Movements: Extraocular movements intact.     Conjunctiva/sclera: Conjunctivae normal.     Pupils: Pupils are equal, round, and reactive to light.     Funduscopic exam:    Right eye: No papilledema.        Left eye: No papilledema.  Cardiovascular:     Rate and Rhythm: Normal rate and regular rhythm.     Heart sounds: No murmur heard. Pulmonary:     Effort: Pulmonary effort is normal. No respiratory distress.     Breath sounds: No wheezing, rhonchi or rales.  Chest:     Chest wall: No tenderness.  Musculoskeletal:     Cervical back: Neck supple.  Lymphadenopathy:     Cervical: No cervical adenopathy.  Skin:  Capillary Refill: Capillary refill takes less than 2 seconds.     Coloration: Skin is not jaundiced or pale.  Neurological:     General: No focal deficit present.     Mental Status: She is alert and oriented to person, place, and time.     Cranial Nerves: No cranial nerve deficit (except low vision).     Sensory: No sensory deficit.     Motor: No weakness.     Coordination: Coordination normal.     Gait: Gait normal.     Deep Tendon Reflexes: Reflexes normal.  Psychiatric:        Behavior: Behavior normal.     UC Treatments / Results  Labs (all labs ordered are listed, but only abnormal results are displayed) Labs Reviewed - No data to display  EKG   Radiology No results found.  Procedures Procedures (including critical care time)  Medications Ordered in UC Medications  ketorolac (TORADOL) 30 MG/ML injection 30 mg (has no administration in time range)    Initial Impression / Assessment and Plan / UC Course  I have reviewed the triage vital signs and the nursing notes.  Pertinent labs & imaging results that were available during my care of the patient were reviewed by me and considered in my medical decision making (see chart for details).    With normal neuro exam, will treat for  prolonged migraine. Discussed going to the ER if she does not improve, for poss advanced imaging We discussed using an imitrex dose here vs toradol here and she picks up her rx's. She opted to do the toradol here, and then she will go pick up her rx's for maxalt and propranolol and restart them. Final Clinical Impressions(s) / UC Diagnoses   Final diagnoses:  Intractable migraine without aura and with status migrainosus     Discharge Instructions      You have been given a Toradol injection here of 30 mg.  Take your Maxalt when you get home if the migraine is continuing.  You may take 5 mg of your Maxalt every 2 hours as needed for continued migraine up to 4 tabs or 20 mg in a 24-hour. Also restart your propranolol daily  If your migraine/headache is not improving in the next 24 hours, you should proceed to the emergency room for possible advanced imaging like a CAT scan     ED Prescriptions   None    PDMP not reviewed this encounter.   Barrett Henle, MD 02/26/21 1148

## 2021-02-26 NOTE — Discharge Instructions (Addendum)
You have been given a Toradol injection here of 30 mg.  Take your Maxalt when you get home if the migraine is continuing.  You may take 5 mg of your Maxalt every 2 hours as needed for continued migraine up to 4 tabs or 20 mg in a 24-hour. Also restart your propranolol daily  If your migraine/headache is not improving in the next 24 hours, you should proceed to the emergency room for possible advanced imaging like a CAT scan

## 2021-02-26 NOTE — ED Triage Notes (Signed)
Reports migraine for 2 weeks. Had virtual visit with PCP Friday and started propanolol for headache.

## 2021-03-02 ENCOUNTER — Encounter: Payer: Self-pay | Admitting: Adult Health

## 2021-03-02 NOTE — Telephone Encounter (Signed)
Please advise 

## 2021-03-08 ENCOUNTER — Other Ambulatory Visit: Payer: Self-pay | Admitting: Adult Health

## 2021-03-08 ENCOUNTER — Encounter: Payer: Self-pay | Admitting: Adult Health

## 2021-03-08 ENCOUNTER — Encounter: Payer: Self-pay | Admitting: Neurology

## 2021-03-08 DIAGNOSIS — G43809 Other migraine, not intractable, without status migrainosus: Secondary | ICD-10-CM

## 2021-03-24 ENCOUNTER — Other Ambulatory Visit: Payer: Self-pay | Admitting: Adult Health

## 2021-03-24 DIAGNOSIS — M2550 Pain in unspecified joint: Secondary | ICD-10-CM

## 2021-03-24 DIAGNOSIS — Z760 Encounter for issue of repeat prescription: Secondary | ICD-10-CM

## 2021-03-24 DIAGNOSIS — M7918 Myalgia, other site: Secondary | ICD-10-CM

## 2021-03-24 NOTE — Telephone Encounter (Signed)
Patient need to schedule an ov for more refills. 

## 2021-03-30 ENCOUNTER — Telehealth: Payer: Self-pay | Admitting: Adult Health

## 2021-03-30 NOTE — Telephone Encounter (Signed)
Spoke with patient to schedule Medicare Annual Wellness Visit (AWV) either virtually or in office.   Pt will call back when she has time off work  awvi due after 05/13/2021  please schedule at anytime with [facility] or 2   This should be a 45 minute visit.

## 2021-04-11 NOTE — Progress Notes (Signed)
NEUROLOGY CONSULTATION NOTE  Karen Hardin MRN: [id number] DOB: 1965/12/10  Referring provider: Shirline Frees, NP Primary care provider: Shirline Frees, NP  Reason for consult:  migraines  Assessment/Plan:   Migraine without aura, without status migrainosus, not intractable.  They tend to occur in clusters but she does not exhibit any autonomic symptoms to diagnose cluster headache.  Migraine prevention:  start topiramate titrating to 50mg  at bedtime Migraine rescue:  Stop rizatriptan.  Start sumatriptan 20mg  NS Limit use of pain relievers to no more than 2 days out of week to prevent risk of rebound or medication-overuse headache. Keep headache diary Follow up 4 months.    Subjective:  Karen Hardin is a 56 year old female with asthma, DM II, arthritis, fibromyalgia, Stargardt disease (legally blind), depression who presents for migraines.  History supplemented by ED and PCP notes.  Onset:  56 years old. They would occur in clusters. Location:  right fronto-temporal/retroorbital, sometimes vertex Quality:  stabbing, pressure Intensity:  10/10.   Aura:  absent Prodrome:  absent Associated symptoms:  nausea, photophobia, phonophobia, osmophobia.  She denies associated autonomic symptoms, unilateral numbness or weakness. Duration:  2 hours.  Wakes up with them. Frequency:  daily during a cluster. Otherwise, 1-2 times a month Occurs in clusters usually lasting a month.  Last episode occurred a year ago.   Frequency of abortive medication: none Triggers:  perfumes Relieving factors:  none Activity:  aggravates  Seen in ED on 02/26/2021 for intractable migraine for which she received headache cocktail.  Current NSAIDS/analgesics:  Excedrin Migraine Current triptans:  rizatriptan 10mg  Current ergotamine:  none Current anti-emetic:  none Current muscle relaxants:  cyclobenzaprine 10mg  TID PRN Current Antihypertensive medications:  lisinopril Current Antidepressant  medications:  duloxetine 30mg  daily Current Anticonvulsant medications:  none Current anti-CGRP:  none Current Vitamins/Herbal/Supplements:  ferrous sulfate, B12, D, zinc Current Antihistamines/Decongestants:  none Other therapy:  none Hormone/birth control:  none   Past NSAIDS/analgesics:  Excedrin Migraine, naproxen, tramadol Past abortive triptans:  sumatriptan tab Past abortive ergotamine:  none Past muscle relaxants:  none Past anti-emetic:  Zofran, promethazine Past antihypertensive medications:  propranolol Past antidepressant medications:  amitriptyline Past anticonvulsant medications:  Lyrica Past anti-CGRP:  none Past vitamins/Herbal/Supplements:  none Past antihistamines/decongestants:  Flonase Other past therapies:  none  Family history of headache:  brother (migraines), children (migraines)      PAST MEDICAL HISTORY: 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   Asthma 2002   uses inhaler as PRN   Depression    as a child, and stress recently   Diabetes mellitus without complication (HCC)    on meds   Fibrocystic breast 2005   Fibromyalgia    on meds   GERD (gastroesophageal reflux disease)    hx of   Hypertension    on meds   LGSIL (low grade squamous intraepithelial lesion) on Pap smear 2007   Libido, decreased 2010   Migraines    migraines - otc med prn, then maxalt if needed   Neuromuscular disorder (HCC)    Ovarian cyst    Stargardt's disease 1986   Patient is legally blind   Tachycardia    per patient diagnosed in ER 2014, no problems currently   Trichomonas    Vaginosis 2008   Vitamin D deficiency    on  meds    PAST SURGICAL HISTORY: Past Surgical History:  Procedure Laterality Date   BREAST EXCISIONAL BIOPSY Left    benign   BREAST SURGERY  1986   biopsy - benign   COLONOSCOPY  2016   JMP-MAC-suprep(good)-[facility]    CYSTOSCOPY  12/26/2012   Procedure: CYSTOSCOPY;  Surgeon: Kirkland Hun, MD;  Location: [facility];  Service: Gynecology;;   DILATION AND CURETTAGE OF UTERUS     ganglion cyst removed from wrist     left   LAPAROSCOPIC ASSISTED VAGINAL HYSTERECTOMY N/A 12/26/2012   Procedure: LAPAROSCOPIC ASSISTED VAGINAL HYSTERECTOMY Uterine Morcellation, Bilateral Salpingectomy, ;  Surgeon: Kirkland Hun, MD;  Location: [facility];  Service: Gynecology;  Laterality: N/A;   MYOMECTOMY     In [place] - laparotomy   OOPHORECTOMY  2000   laparotomy in [place]   SHOULDER ARTHROSCOPY Left 2021   SHOULDER ARTHROSCOPY W/ ROTATOR CUFF REPAIR Right 2006   TONSILLECTOMY AND ADENOIDECTOMY     TUBAL LIGATION  2000   WISDOM TOOTH EXTRACTION     x 2 teeth   WISDOM TOOTH EXTRACTION      MEDICATIONS: Current Outpatient Medications on File Prior to Visit  Medication Sig Dispense Refill   Ascorbic Acid (VITAMIN C PO) Take 750 mg by mouth daily.     Blood Glucose Monitoring Suppl (ONE TOUCH BASIC SYSTEM) w/Device KIT Test once daily. Dx: E11.9 1 each 0   Cholecalciferol (VITAMIN D3) 125 MCG (5000 UT) TABS Take 1 tablet by mouth daily.     cyclobenzaprine (FLEXERIL) 10 MG tablet TAKE 1 TABLET BY MOUTH EVERY 8 HOURS AS NEEDED FOR MUSCLE SPASM 30 tablet 0   DULoxetine (CYMBALTA) 30 MG capsule Take 1 capsule (30 mg total) by mouth daily. 90 capsule 0   Ferrous Sulfate (IRON PO) Take 65 mg by mouth daily.     glipiZIDE (GLUCOTROL XL) 2.5 MG 24 hr tablet Take 1 tablet by mouth once daily with breakfast 90 tablet 0   glucose blood test strip Use as instructed 200 each 3   lisinopril (ZESTRIL) 2.5 MG tablet Take 1 tablet by mouth once daily 90 tablet 3   Multiple Vitamin (MULTIVITAMIN ADULT PO) Take 1 tablet by mouth daily.     OneTouch Delica Lancets 33G MISC Check blood sugars twice a day 200 each 3   OVER THE COUNTER MEDICATION Apply 1 application topically as needed. Theragesic Cream.  Applying to neck & back.      pregabalin (LYRICA) 75 MG capsule Take 1 capsule (75 mg total) by mouth 2 (two) times daily. (Patient taking differently: Take 75 mg by mouth 2 (two) times daily. Pt reports daily) 60 capsule 2   PROAIR HFA 108 (90 Base) MCG/ACT inhaler Inhale 2 puffs into the lungs every 4 (four) hours as needed for wheezing or shortness of breath. 18 g 2   propranolol (INDERAL) 40 MG tablet Take 1 tablet (40 mg total) by mouth daily. 90 tablet 1   rizatriptan (MAXALT) 5 MG tablet Take 1 tablet (5 mg total) by mouth as needed. 10 tablet 3   vitamin B-12 (CYANOCOBALAMIN) 1000 MCG tablet Take 1,000 mcg by mouth daily.     Vitamin D, Ergocalciferol, (DRISDOL) 1.25 MG (50000 UNIT) CAPS capsule Take 1 capsule (50,000 Units total) by mouth every 7 (seven) days. 12 capsule 0   zinc gluconate 50 MG tablet Take 50 mg by mouth daily.     No current facility-administered medications on file prior to visit.  ALLERGIES: No Known Allergies  FAMILY HISTORY: Family History  Problem Relation Age of Onset   Arthritis Mother    Diabetes Mother    Hypertension Mother    Pulmonary embolism Mother        Cardiac arrest    Diverticulitis Mother    Heart disease Father    Heart attack Father    Mesothelioma Father    Hyperlipidemia Maternal Grandmother    Breast cancer Maternal Grandmother    Colitis Paternal Aunt        in 65's   Stomach cancer Paternal Aunt    Heart attack Paternal Aunt    Heart attack Paternal Uncle    Stroke Paternal Aunt    Breast cancer Maternal Aunt    Colon polyps Sister 65   Diverticulitis Sister    Colon cancer Neg Hx    Esophageal cancer Neg Hx    Rectal cancer Neg Hx     Objective:  Blood pressure 135/83, pulse 73, resp. rate 18, height 5\' 8"  (1.727 m), weight 217 lb (98.4 kg), last menstrual period 12/14/2012, SpO2 99 %. General: No acute distress.  Patient appears well-groomed.   Head:  Normocephalic/atraumatic Eyes:  fundi examined but not visualized Neck: supple, no  paraspinal tenderness, full range of motion Back: No paraspinal tenderness Heart: regular rate and rhythm Lungs: Clear to auscultation bilaterally. Vascular: No carotid bruits. Neurological Exam: Mental status: alert and oriented to person, place, and time, recent and remote memory intact, fund of knowledge intact, attention and concentration intact, speech fluent and not dysarthric, language intact. Cranial nerves: CN I: not tested CN II: pupils equal, round and reactive to light, reduced central vision bilaterally CN III, IV, VI:  full range of motion, no nystagmus, no ptosis CN V: facial sensation intact. CN VII: upper and lower face symmetric CN VIII: hearing intact CN IX, X: gag intact, uvula midline CN XI: sternocleidomastoid and trapezius muscles intact CN XII: tongue midline Bulk & Tone: normal, no fasciculations. Motor:  muscle strength 5/5 throughout Sensation:  Pinprick, temperature and vibratory sensation intact. Deep Tendon Reflexes:  2+ throughout,  toes downgoing.   Finger to nose testing:  Without dysmetria.   Heel to shin:  Without dysmetria.   Gait:  Normal station and stride.  Romberg negative.    Thank you for allowing me to take part in the care of this patient.  Shon Millet, DO  CC: Shirline Frees, NP

## 2021-04-12 ENCOUNTER — Other Ambulatory Visit: Payer: Self-pay

## 2021-04-12 ENCOUNTER — Encounter: Payer: Self-pay | Admitting: Neurology

## 2021-04-12 ENCOUNTER — Ambulatory Visit: Payer: PPO | Admitting: Neurology

## 2021-04-12 VITALS — BP 135/83 | HR 73 | Resp 18 | Ht 68.0 in | Wt 217.0 lb

## 2021-04-12 DIAGNOSIS — G43009 Migraine without aura, not intractable, without status migrainosus: Secondary | ICD-10-CM

## 2021-04-12 MED ORDER — TOPIRAMATE 25 MG PO TABS: ORAL_TABLET | ORAL | 0 refills | Status: DC

## 2021-04-12 MED ORDER — SUMATRIPTAN 20 MG/ACT NA SOLN: 20.0000 mg | NASAL | 5 refills | Status: DC | PRN

## 2021-04-12 NOTE — Patient Instructions (Signed)
?  Start topiramate as directed.  Contact us in 5 weeks with update and we can increase dose if needed. ?STOP RIZATRIPTAN.  Take sumatriptan nasal spray at earliest onset of headache.  May repeat dose once in 2 hours if needed.  Maximum 2 sprays in 24 hours. ?Limit use of pain relievers to no more than 2 days out of the week.  These medications include acetaminophen, NSAIDs (ibuprofen/Advil/Motrin, naproxen/Aleve, triptans (Imitrex/sumatriptan), Excedrin, and narcotics.  This will help reduce risk of rebound headaches. ?Be aware of common food triggers: ? - Caffeine:  coffee, black tea, cola, Mt. Dew ? - Chocolate ? - Dairy:  aged cheeses (brie, blue, cheddar, gouda, [place], provolone, [place], Swiss, etc), chocolate milk, buttermilk, sour cream, limit eggs and yogurt ? - Nuts, peanut butter ? - Alcohol ? - Cereals/grains:  FRESH breads (fresh bagels, sourdough, doughnuts), yeast productions ? - Processed/canned/aged/cured meats (pre-packaged deli meats, hotdogs) ? - MSG/glutamate:  soy sauce, flavor enhancer, pickled/preserved/marinated foods ? - Sweeteners:  aspartame (Equal, Nutrasweet).  Sugar and Splenda are okay ? - Vegetables:  legumes (lima beans, lentils, snow peas, fava beans, pinto peans, peas, garbanzo beans), sauerkraut, onions, olives, pickles ? - Fruit:  avocados, bananas, citrus fruit (orange, lemon, grapefruit), mango ? - Other:  Frozen meals, macaroni and cheese ?Routine exercise ?Stay adequately hydrated (aim for 64 oz water daily) ?Keep headache diary ?Maintain proper stress management ?Maintain proper sleep hygiene ?Do not skip meals ?Consider supplements:  magnesium citrate 400mg  daily, riboflavin 400mg  daily, coenzyme Q10 100mg  three times daily. ? ?

## 2021-04-14 ENCOUNTER — Other Ambulatory Visit: Payer: Self-pay | Admitting: Adult Health

## 2021-04-14 DIAGNOSIS — M7918 Myalgia, other site: Secondary | ICD-10-CM

## 2021-04-14 DIAGNOSIS — M2550 Pain in unspecified joint: Secondary | ICD-10-CM

## 2021-04-14 DIAGNOSIS — Z760 Encounter for issue of repeat prescription: Secondary | ICD-10-CM

## 2021-04-27 ENCOUNTER — Encounter: Payer: Self-pay | Admitting: Adult Health

## 2021-04-27 ENCOUNTER — Ambulatory Visit (INDEPENDENT_AMBULATORY_CARE_PROVIDER_SITE_OTHER): Payer: PPO | Admitting: Adult Health

## 2021-04-27 VITALS — BP 130/86 | HR 82 | Temp 98.6°F | Ht 68.0 in | Wt 217.0 lb

## 2021-04-27 DIAGNOSIS — M5442 Lumbago with sciatica, left side: Secondary | ICD-10-CM

## 2021-04-27 MED ORDER — METHYLPREDNISOLONE 4 MG PO TBPK: ORAL_TABLET | ORAL | 0 refills | Status: DC

## 2021-04-27 NOTE — Progress Notes (Signed)
Subjective:    Patient ID: Karen Hardin, female    DOB: 1965/05/31, 56 y.o.   MRN: [id number]  HPI  56 year old female who  has a past medical history of Allergy, Anemia, Arthritis, Asthma (2002), Depression, Diabetes mellitus without complication (HCC), Fibrocystic breast (2005), Fibromyalgia, GERD (gastroesophageal reflux disease), Hypertension, LGSIL (low grade squamous intraepithelial lesion) on Pap smear (2007), Libido, decreased (2010), Migraines, Neuromuscular disorder (HCC), Ovarian cyst, Stargardt's disease (1986), Tachycardia, Trichomonas, Vaginosis (2008), and Vitamin D deficiency.  She presents to the office today for left lower back pain. She reports that her pain has been present for the last " several weeks". Pain is intermittent but happens every day and varies in intensity. Pain is better with laying in bed and in sitting in the a reclining chair. Pain is worse with sitting and ambulation. Pain radiates down into her left buttocks, into the groin.   If she takes Flexeril and or lyrica it helps with her pain but also makes her sleepy.   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   Asthma 2002   uses inhaler as PRN   Depression    as a child, and stress recently   Diabetes mellitus without complication (HCC)    on meds   Fibrocystic breast 2005   Fibromyalgia    on meds   GERD (gastroesophageal reflux disease)    hx of   Hypertension    on meds   LGSIL (low grade squamous intraepithelial lesion) on Pap smear 2007   Libido, decreased 2010   Migraines    migraines - otc med prn, then maxalt if needed   Neuromuscular disorder (HCC)    Ovarian cyst    Stargardt's disease 1986   Patient is legally blind   Tachycardia    per patient diagnosed in ER 2014, no problems currently    Trichomonas    Vaginosis 2008   Vitamin D deficiency    on meds    Social History   Socioeconomic History   Marital status: Married    Spouse name: Not on file   Number of children: 3   Years of education: Not on file   Highest education level: Some college, no degree  Occupational History   Not on file  Tobacco Use   Smoking status: Some Days    Packs/day: 0.10    Years: 20.00    Pack years: 2.00    Types: Cigarettes    Start date: 02/28/2015   Smokeless tobacco: Never  Vaping Use   Vaping Use: Never used  Substance and Sexual Activity   Alcohol use: No    Alcohol/week: 0.0 standard drinks   Drug use: No   Sexual activity: Yes    Birth control/protection: Surgical, Condom  Other Topics Concern   Not on file  Social History Narrative   Is on disability    Married for 21 years    Three boys ( all three live locally)       She likes to hang out with friends. 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 Right handed   Drinks caffeine   One story home   Social Determinants of Health   Financial Resource Strain:  Not on file  Food Insecurity: Not on file  Transportation Needs: Not on file  Physical Activity: Not on file  Stress: Not on file  Social Connections: Not on file  Intimate Partner Violence: Not on file    Past Surgical History:  Procedure Laterality Date   BREAST EXCISIONAL BIOPSY Left    benign   BREAST SURGERY  1986   biopsy - benign   COLONOSCOPY  2016   JMP-MAC-suprep(good)-[facility]   CYSTOSCOPY  12/26/2012   Procedure: CYSTOSCOPY;  Surgeon: Kirkland Hun, MD;  Location: [facility];  Service: Gynecology;;   DILATION AND CURETTAGE OF UTERUS     ganglion cyst removed from wrist     left   LAPAROSCOPIC ASSISTED VAGINAL HYSTERECTOMY N/A 12/26/2012   Procedure: LAPAROSCOPIC ASSISTED VAGINAL HYSTERECTOMY Uterine Morcellation, Bilateral Salpingectomy, ;  Surgeon: Kirkland Hun, MD;  Location: [facility];  Service: Gynecology;   Laterality: N/A;   MYOMECTOMY     In [place] - laparotomy   OOPHORECTOMY  2000   laparotomy in [place]   SHOULDER ARTHROSCOPY Left 2021   SHOULDER ARTHROSCOPY W/ ROTATOR CUFF REPAIR Right 2006   TONSILLECTOMY AND ADENOIDECTOMY     TUBAL LIGATION  2000   WISDOM TOOTH EXTRACTION     x 2 teeth   WISDOM TOOTH EXTRACTION      Family History  Problem Relation Age of Onset   Arthritis Mother    Diabetes Mother    Hypertension Mother    Pulmonary embolism Mother        Cardiac arrest    Diverticulitis Mother    Heart disease Father    Heart attack Father    Mesothelioma Father    Hyperlipidemia Maternal Grandmother    Breast cancer Maternal Grandmother    Colitis Paternal Aunt        in 79's   Stomach cancer Paternal Aunt    Heart attack Paternal Aunt    Heart attack Paternal Uncle    Stroke Paternal Aunt    Breast cancer Maternal Aunt    Colon polyps Sister 45   Diverticulitis Sister    Colon cancer Neg Hx    Esophageal cancer Neg Hx    Rectal cancer Neg Hx     Allergies  Allergen Reactions   Cholestatin     Current Outpatient Medications on File Prior to Visit  Medication Sig Dispense Refill   Ascorbic Acid (VITAMIN C PO) Take 750 mg by mouth daily.     Blood Glucose Monitoring Suppl (ONE TOUCH BASIC SYSTEM) w/Device KIT Test once daily. Dx: E11.9 1 each 0   Cholecalciferol (VITAMIN D3) 125 MCG (5000 UT) TABS Take 1 tablet by mouth daily.     cyclobenzaprine (FLEXERIL) 10 MG tablet TAKE 1 TABLET BY MOUTH EVERY 8 HOURS AS NEEDED FOR MUSCLE SPASM 30 tablet 0   DULoxetine (CYMBALTA) 30 MG capsule Take 1 capsule (30 mg total) by mouth daily. 90 capsule 0   Ferrous Sulfate (IRON PO) Take 65 mg by mouth daily.     glipiZIDE (GLUCOTROL XL) 2.5 MG 24 hr tablet Take 1 tablet by mouth once daily with breakfast 90 tablet 0   glucose blood test strip Use as instructed 200 each 3   lisinopril (ZESTRIL) 2.5 MG tablet Take 1 tablet by mouth once daily 90 tablet 3   Multiple  Vitamin (MULTIVITAMIN ADULT PO) Take 1 tablet by mouth daily.     OneTouch Delica Lancets 33G MISC Check blood sugars twice a day 200  each 3   OVER THE COUNTER MEDICATION Apply 1 application topically as needed. Theragesic Cream.  Applying to neck & back.     PROAIR HFA 108 (90 Base) MCG/ACT inhaler Inhale 2 puffs into the lungs every 4 (four) hours as needed for wheezing or shortness of breath. 18 g 2   propranolol (INDERAL) 40 MG tablet Take 1 tablet (40 mg total) by mouth daily. 90 tablet 1   SUMAtriptan (IMITREX) 20 MG/ACT nasal spray Place 1 spray (20 mg total) into the nose every 2 (two) hours as needed for migraine or headache. May repeat in 2 hours if headache persists or recurs.  Maximum 2 sprays in 24 hours 6 each 5   topiramate (TOPAMAX) 25 MG tablet Take 1 tablet at bedtime for one week, then increase to 2 tablets at bedtime 60 tablet 0   vitamin B-12 (CYANOCOBALAMIN) 1000 MCG tablet Take 1,000 mcg by mouth daily.     Vitamin D, Ergocalciferol, (DRISDOL) 1.25 MG (50000 UNIT) CAPS capsule Take 1 capsule (50,000 Units total) by mouth every 7 (seven) days. 12 capsule 0   zinc gluconate 50 MG tablet Take 50 mg by mouth daily.     pregabalin (LYRICA) 75 MG capsule Take 1 capsule (75 mg total) by mouth 2 (two) times daily. (Patient not taking: Reported on 04/12/2021) 60 capsule 2   No current facility-administered medications on file prior to visit.    BP 130/86   Pulse 82   Temp 98.6 F (37 C) (Oral)   Ht 5\' 8"  (1.727 m)   Wt 217 lb (98.4 kg)   LMP 12/14/2012   SpO2 99%   BMI 32.99 kg/m       Objective:   Physical Exam Vitals and nursing note reviewed.  Constitutional:      Appearance: Normal appearance.  Musculoskeletal:        General: Tenderness present. Normal range of motion.       Back:       Legs:  Skin:    General: Skin is warm and dry.     Capillary Refill: Capillary refill takes less than 2 seconds.  Neurological:     General: No focal deficit present.      Mental Status: She is alert and oriented to person, place, and time.  Psychiatric:        Mood and Affect: Mood normal.        Behavior: Behavior normal.        Thought Content: Thought content normal.        Judgment: Judgment normal.      Assessment & Plan:  1. Acute left-sided low back pain with left-sided sciatica -  Warm compresses - Continue with Flexeril at night  - Gentle stretching exercises - methylPREDNISolone (MEDROL DOSEPAK) 4 MG TBPK tablet; Take as directed  Dispense: 21 tablet; Refill: 0 - Follow up if not resolving in the next 4-5 days   Shirline Frees, NP

## 2021-05-25 ENCOUNTER — Telehealth: Payer: Self-pay | Admitting: Adult Health

## 2021-05-25 NOTE — Telephone Encounter (Signed)
Left message for patient to call back and schedule Medicare Annual Wellness Visit (AWV) either virtually or in office. Left  my jabber number [phone] ° ° °AWV-I per [place] 02/12/09 °please schedule at anytime with [facility] Nurse Health Advisor 1 or 2 ° ° °This should be a 45 minute visit.  °

## 2021-07-26 ENCOUNTER — Telehealth: Payer: Self-pay | Admitting: Adult Health

## 2021-07-26 NOTE — Telephone Encounter (Signed)
Left message for patient to call back and schedule Medicare Annual Wellness Visit (AWV) either virtually or in office. Left  my jabber number [phone] ° ° °AWV-I per [place] 02/12/09 °please schedule at anytime with [facility] Nurse Health Advisor 1 or 2 ° ° °This should be a 45 minute visit.  °

## 2021-08-17 NOTE — Progress Notes (Deleted)
NEUROLOGY FOLLOW UP OFFICE NOTE  Karen Hardin [id number]  Assessment/Plan:   Migraine without aura, without status migrainosus, not intractable.  They tend to occur in clusters but she does not exhibit any autonomic symptoms to diagnose cluster headache.   Migraine prevention:  start topiramate titrating to 58m at bedtime Migraine rescue:  Stop rizatriptan.  Start sumatriptan 28mNS Limit use of pain relievers to no more than 2 days out of week to prevent risk of rebound or medication-overuse headache. Keep headache diary Follow up 4 months.       Subjective:  Karen Hardin with asthma, DM II, arthritis, fibromyalgia, Stargardt disease (legally blind), depression who follows up for migraines.  UPDATE: Started topiramate in March.  Sumatriptan NS Intensity:  *** Duration:  *** Frequency:  *** Frequency of abortive medication: *** Current NSAIDS/analgesics:  Excedrin Migraine Current triptans:  sumatriptan 2040mS Current ergotamine:  none Current anti-emetic:  none Current muscle relaxants:  cyclobenzaprine 55m72mD PRN Current Antihypertensive medications:  lisinopril Current Antidepressant medications:  duloxetine 30mg64mly Current Anticonvulsant medications:  topiramate 50mg 37mCurrent anti-CGRP:  none Current Vitamins/Herbal/Supplements:  ferrous sulfate, B12, D, zinc Current Antihistamines/Decongestants:  none Other therapy:  none Hormone/birth control:  none   HISTORY:  Onset:  17 yea61 old. They would occur in clusters. Location:  right fronto-temporal/retroorbital, sometimes vertex Quality:  stabbing, pressure Intensity:  10/10.   Aura:  absent Prodrome:  absent Associated symptoms:  nausea, photophobia, phonophobia, osmophobia.  She denies associated autonomic symptoms, unilateral numbness or weakness. Duration:  2 hours.  Wakes up with them. Frequency:  daily during a cluster. Otherwise, 1-2 times a month Occurs in  clusters usually lasting a month.  Last episode occurred a year ago.   Frequency of abortive medication: none Triggers:  perfumes Relieving factors:  none Activity:  aggravates   Seen in ED on 02/26/2021 for intractable migraine for which she received headache cocktail.      Past NSAIDS/analgesics:  Excedrin Migraine, naproxen, tramadol Past abortive triptans:  sumatriptan tab, rizatriptan 55mg P47mabortive ergotamine:  none Past muscle relaxants:  none Past anti-emetic:  Zofran, promethazine Past antihypertensive medications:  propranolol Past antidepressant medications:  amitriptyline Past anticonvulsant medications:  Lyrica Past anti-CGRP:  none Past vitamins/Herbal/Supplements:  none Past antihistamines/decongestants:  Flonase Other past therapies:  none   Family history of headache:  brother (migraines), children (migraines)  PAST MEDICAL HISTORY: 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   Asthma 2002   uses inhaler as PRN   Depression    as a child, and stress recently   Diabetes mellitus without complication (HCC)   [place] meds   Fibrocystic breast 2005   Fibromyalgia    on meds   GERD (gastroesophageal reflux disease)    hx of   Hypertension    on meds   LGSIL (low grade squamous intraepithelial lesion) on Pap smear 2007   Libido, decreased 2010   Migraines    migraines - otc med prn, then maxalt if needed   Neuromuscular disorder (HCC)   Maxarian cyst    Stargardt's disease 1986   Patient is legally blind   Tachycardia    per patient diagnosed in ER 2014, no problems currently   Trichomonas    Vaginosis 2008  Vitamin D deficiency    on meds    MEDICATIONS: Current Outpatient Medications on File Prior to Visit  Medication Sig Dispense Refill   Ascorbic Acid (VITAMIN C PO) Take 750 mg by mouth daily.     Blood Glucose Monitoring  Suppl ([employer]) w/Device KIT Test once daily. Dx: E11.9 1 each 0   Cholecalciferol (VITAMIN D3) 125 MCG (5000 UT) TABS Take 1 tablet by mouth daily.     cyclobenzaprine (FLEXERIL) 10 MG tablet TAKE 1 TABLET BY MOUTH EVERY 8 HOURS AS NEEDED FOR MUSCLE SPASM 30 tablet 0   DULoxetine (CYMBALTA) 30 MG capsule Take 1 capsule (30 mg total) by mouth daily. 90 capsule 0   Ferrous Sulfate (IRON PO) Take 65 mg by mouth daily.     glipiZIDE (GLUCOTROL XL) 2.5 MG 24 hr tablet Take 1 tablet by mouth once daily with breakfast 90 tablet 0   glucose blood test strip Use as instructed 200 each 3   lisinopril (ZESTRIL) 2.5 MG tablet Take 1 tablet by mouth once daily 90 tablet 3   methylPREDNISolone (MEDROL DOSEPAK) 4 MG TBPK tablet Take as directed 21 tablet 0   Multiple Vitamin (MULTIVITAMIN ADULT PO) Take 1 tablet by mouth daily.     OneTouch Delica Lancets 97J MISC Check blood sugars twice a day 200 each 3   OVER THE COUNTER MEDICATION Apply 1 application topically as needed. Theragesic Cream.  Applying to neck & back.     pregabalin (LYRICA) 75 MG capsule Take 1 capsule (75 mg total) by mouth 2 (two) times daily. (Patient not taking: Reported on 04/12/2021) 60 capsule 2   PROAIR HFA 108 (90 Base) MCG/ACT inhaler Inhale 2 puffs into the lungs every 4 (four) hours as needed for wheezing or shortness of breath. 18 g 2   SUMAtriptan (IMITREX) 20 MG/ACT nasal spray Place 1 spray (20 mg total) into the nose every 2 (two) hours as needed for migraine or headache. May repeat in 2 hours if headache persists or recurs.  Maximum 2 sprays in 24 hours 6 each 5   topiramate (TOPAMAX) 25 MG tablet Take 1 tablet at bedtime for one week, then increase to 2 tablets at bedtime 60 tablet 0   vitamin B-12 (CYANOCOBALAMIN) 1000 MCG tablet Take 1,000 mcg by mouth daily.     Vitamin D, Ergocalciferol, (DRISDOL) 1.25 MG (50000 UNIT) CAPS capsule Take 1 capsule (50,000 Units total) by mouth every 7 (seven) days. 12 capsule 0    zinc gluconate 50 MG tablet Take 50 mg by mouth daily.     No current facility-administered medications on file prior to visit.    ALLERGIES: Allergies  Allergen Reactions   Cholestatin     FAMILY HISTORY: Family History  Problem Relation Age of Onset   Arthritis Mother    Diabetes Mother    Hypertension Mother    Pulmonary embolism Mother        Cardiac arrest    Diverticulitis Mother    Heart disease Father    Heart attack Father    Mesothelioma Father    Hyperlipidemia Maternal Grandmother    Breast cancer Maternal Grandmother    Colitis Paternal Aunt        in 40's   Stomach cancer Paternal Aunt    Heart attack Paternal Aunt    Heart attack Paternal Uncle    Stroke Paternal Aunt    Breast cancer Maternal Aunt    Colon polyps Sister 31  Diverticulitis Sister    Colon cancer Neg Hx    Esophageal cancer Neg Hx    Rectal cancer Neg Hx       Objective:  *** General: No acute distress.  Patient appears well-groomed.   Head:  Normocephalic/atraumatic Eyes:  Fundi examined but not visualized Neck: supple, no paraspinal tenderness, full range of motion Heart:  Regular rate and rhythm Lungs:  Clear to auscultation bilaterally Back: No paraspinal tenderness Neurological Exam: alert and oriented to person, place, and time.  Speech fluent and not dysarthric, language intact.  CN II-XII intact. Bulk and tone normal, muscle strength 5/5 throughout.  Sensation to light touch intact.  Deep tendon reflexes 2+ throughout, toes downgoing.  Finger to nose testing intact.  Gait normal, Romberg negative.   Metta Clines, DO  CC: [name], NP

## 2021-08-18 ENCOUNTER — Encounter: Payer: Self-pay | Admitting: Neurology

## 2021-08-18 ENCOUNTER — Ambulatory Visit: Payer: PPO | Admitting: Neurology

## 2021-08-18 DIAGNOSIS — Z029 Encounter for administrative examinations, unspecified: Secondary | ICD-10-CM

## 2021-09-06 ENCOUNTER — Telehealth: Payer: Self-pay | Admitting: Pharmacist

## 2021-09-06 ENCOUNTER — Telehealth: Payer: Self-pay

## 2021-09-06 NOTE — Chronic Care Management (AMB) (Unsigned)
Chronic Care Management Pharmacy Assistant   Name: Karen Hardin  MRN: [id number] DOB: 1965/08/12  Reason for Encounter: Disease State   Conditions to be addressed/monitored: General Assessment  Recent office visits:  04/27/21 [name], NP - Patient presented for Acute left sided low back pain with left sided sciatica. Prescribed Methylprednisolone. Stopped Propanolol.   Recent consult visits:  04/12/21 Pieter Partridge, DO (Neurology) - Patient presented for Migraine without aura and without status migrainosus. Prescribed Sumatriptan. Prescribed Topiramate. Stopped Rizatriptan.   Hospital visits:  None in previous 6 months  Medications: Outpatient Encounter Medications as of 09/06/2021  Medication Sig   Ascorbic Acid (VITAMIN C PO) Take 750 mg by mouth daily.   Blood Glucose Monitoring Suppl ([employer]) w/Device KIT Test once daily. Dx: E11.9   Cholecalciferol (VITAMIN D3) 125 MCG (5000 UT) TABS Take 1 tablet by mouth daily.   cyclobenzaprine (FLEXERIL) 10 MG tablet TAKE 1 TABLET BY MOUTH EVERY 8 HOURS AS NEEDED FOR MUSCLE SPASM   DULoxetine (CYMBALTA) 30 MG capsule Take 1 capsule (30 mg total) by mouth daily.   Ferrous Sulfate (IRON PO) Take 65 mg by mouth daily.   glipiZIDE (GLUCOTROL XL) 2.5 MG 24 hr tablet Take 1 tablet by mouth once daily with breakfast   glucose blood test strip Use as instructed   lisinopril (ZESTRIL) 2.5 MG tablet Take 1 tablet by mouth once daily   methylPREDNISolone (MEDROL DOSEPAK) 4 MG TBPK tablet Take as directed   Multiple Vitamin (MULTIVITAMIN ADULT PO) Take 1 tablet by mouth daily.   OneTouch Delica Lancets 23R MISC Check blood sugars twice a day   OVER THE COUNTER MEDICATION Apply 1 application topically as needed. Theragesic Cream.  Applying to neck & back.   pregabalin (LYRICA) 75 MG capsule Take 1 capsule (75 mg total) by mouth 2 (two) times daily. (Patient not taking: Reported on 04/12/2021)   PROAIR HFA 108 (90 Base)  MCG/ACT inhaler Inhale 2 puffs into the lungs every 4 (four) hours as needed for wheezing or shortness of breath.   SUMAtriptan (IMITREX) 20 MG/ACT nasal spray Place 1 spray (20 mg total) into the nose every 2 (two) hours as needed for migraine or headache. May repeat in 2 hours if headache persists or recurs.  Maximum 2 sprays in 24 hours   topiramate (TOPAMAX) 25 MG tablet Take 1 tablet at bedtime for one week, then increase to 2 tablets at bedtime   vitamin B-12 (CYANOCOBALAMIN) 1000 MCG tablet Take 1,000 mcg by mouth daily.   Vitamin D, Ergocalciferol, (DRISDOL) 1.25 MG (50000 UNIT) CAPS capsule Take 1 capsule (50,000 Units total) by mouth every 7 (seven) days.   zinc gluconate 50 MG tablet Take 50 mg by mouth daily.   No facility-administered encounter medications on file as of 09/06/2021.  [place] for General Review Call   Adherence Review:  Does the Clinical Pharmacist Assistant have access to adherence rates? Yes Adherence rates for STAR metric medications  Lisinopril (Zestril) 2.5 mg  - Last filled 04/14/21 90 DS at [facility] Glipizide (Glucotrol) 2.5 mg - Last filled 04/14/21 90 DS at [facility] Lisinopril (Zestril) 2.5 mg  - Last filled 02/01/21 90 DS at [facility] Glipizide (Glucotrol) 2.5 mg - Last filled 02/01/21 90 DS at [facility]  Does the patient have >5 day gap between last estimated fill dates for any of the above medications or other medication gaps? Yes    Disease State Questions:  Able to connect  with Patient? {yes/no:20286} Did patient have any problems with their health recently? {yes/no:20286} Note problems and Concerns: Have you had any admissions or emergency room visits or worsening of your condition(s) since last visit? {yes/no:20286} Details of ED visit, hospital visit and/or worsening condition(s): Have you had any visits with new specialists or providers since your last visit? {yes/no:20286} Explain: Have you had any new health care problem(s)  since your last visit? {yes/no:20286} New problem(s) reported: Have you run out of any of your medications since you last spoke with clinical pharmacist? {yes/no:20286} What caused you to run out of your medications? Are there any medications you are not taking as prescribed? {yes/no:20286} What kept you from taking your medications as prescribed? Are you having any issues or side effects with your medications? {yes/no:20286} Note of issues or side effects: Do you have any other health concerns or questions you want to discuss with your Clinical Pharmacist before your next visit? {yes/no:20286} Note additional concerns and questions from Patient. Are there any health concerns that you feel we can do a better job addressing? {yes/no:20286} Note Patient's response. Are you having any problems with any of the following since the last visit: (select all that apply)  {General Call:27390}  Details: 12. Any falls since last visit? {yes/no:20286}  Details: 13. Any increased or uncontrolled pain since last visit? {yes/no:20286}  Details: 14. Next visit Type: {Telephone/Office:25179}       Visit with:        Date:        Time:  25. Additional Details? {yes/no:20286}   Reason for medication gaps.  Care Gaps: COVID Booster - Overdue Hepatitis Screening - Overdue Zoster Vaccine - Overdue Foot Exam - Overdue HGB A1C - Overdue HIV Screen - Postponed BP- 130/86 04/27/21 AWV-  Office aware to schedule as of 3/23 Lab Results  Component Value Date   HGBA1C 6.4 (A) 11/04/2020    Star Rating Drugs: Lisinopril (Zestril) 2.5 mg  - Last filled 04/14/21 90 DS at [facility] Glipizide (Glucotrol) 2.5 mg - Last filled 04/14/21 90 DS at [facility] Pharmacist Assistant [phone]

## 2021-09-06 NOTE — Telephone Encounter (Signed)
No CPE noted in last year.  LVM instructions for pt to call to scheduled CPE.

## 2021-09-15 ENCOUNTER — Telehealth: Payer: Self-pay | Admitting: Adult Health

## 2021-09-15 NOTE — Telephone Encounter (Signed)
Left message for patient to call back and schedule Medicare Annual Wellness Visit (AWV) either virtually or in office. Left  my jabber number [phone] ° ° °AWV-I per [place] 02/12/09 °please schedule at anytime with [facility] Nurse Health Advisor 1 or 2 ° ° °This should be a 45 minute visit.  °

## 2021-10-02 ENCOUNTER — Ambulatory Visit (INDEPENDENT_AMBULATORY_CARE_PROVIDER_SITE_OTHER): Payer: PPO

## 2021-10-02 VITALS — Ht 68.0 in | Wt 220.0 lb

## 2021-10-02 DIAGNOSIS — Z0000 Encounter for general adult medical examination without abnormal findings: Secondary | ICD-10-CM

## 2021-10-02 NOTE — Progress Notes (Signed)
I connected with Karen Hardin today by telephone and verified that I am speaking with the correct person using two identifiers. Location patient: home Location provider: work Persons participating in the virtual visit: Kory Panjwani, Glenna Durand LPN.   I discussed the limitations, risks, security and privacy concerns of performing an evaluation and management service by telephone and the availability of in person appointments. I also discussed with the patient that there may be a patient responsible charge related to this service. The patient expressed understanding and verbally consented to this telephonic visit.    Interactive audio and video telecommunications were attempted between this provider and patient, however failed, due to patient having technical difficulties OR patient did not have access to video capability.  We continued and completed visit with audio only.     Vital signs may be patient reported or missing.  Subjective:   Karen Hardin is a 56 y.o. female who presents for an Initial Medicare Annual Wellness Visit.  Review of Systems     Cardiac Risk Factors include: diabetes mellitus;obesity (BMI >30kg/m2)     Objective:    Today's Vitals   10/02/21 1602 10/02/21 1604  Weight: 220 lb (99.8 kg)   Height: _0  (1.727 m)   PainSc:  7    Body mass index is 33.45 kg/m.     10/02/2021    4:13 PM 04/12/2021   11:07 AM 10/31/2018    8:15 AM 10/07/2015    6:45 PM 11/23/2014    2:03 PM 09/28/2014    1:17 PM 12/26/2012    8:05 PM  Advanced Directives  Does Patient Have a Medical Advance Directive? _1  No Patient does not have advance directive  Would patient like information on creating a medical advance directive?   No - Patient declined No - patient declined information No - patient declined information    Pre-existing out of facility DNR order (yellow form or pink MOST form)       No    Current Medications (verified) Outpatient Encounter  Medications as of 10/02/2021  Medication Sig   Blood Glucose Monitoring Suppl ([place]) w/Device KIT Test once daily. Dx: E11.9   glucose blood test strip Use as instructed   OneTouch Delica Lancets 70W MISC Check blood sugars twice a day   Ascorbic Acid (VITAMIN C PO) Take 750 mg by mouth daily. (Patient not taking: Reported on 10/02/2021)   Cholecalciferol (VITAMIN D3) 125 MCG (5000 UT) TABS Take 1 tablet by mouth daily. (Patient not taking: Reported on 10/02/2021)   cyclobenzaprine (FLEXERIL) 10 MG tablet TAKE 1 TABLET BY MOUTH EVERY 8 HOURS AS NEEDED FOR MUSCLE SPASM (Patient not taking: Reported on 10/02/2021)   DULoxetine (CYMBALTA) 30 MG capsule Take 1 capsule (30 mg total) by mouth daily. (Patient not taking: Reported on 10/02/2021)   Ferrous Sulfate (IRON PO) Take 65 mg by mouth daily. (Patient not taking: Reported on 10/02/2021)   glipiZIDE (GLUCOTROL XL) 2.5 MG 24 hr tablet Take 1 tablet by mouth once daily with breakfast (Patient not taking: Reported on 10/02/2021)   lisinopril (ZESTRIL) 2.5 MG tablet Take 1 tablet by mouth once daily (Patient not taking: Reported on 10/02/2021)   methylPREDNISolone (MEDROL DOSEPAK) 4 MG TBPK tablet Take as directed (Patient not taking: Reported on 10/02/2021)   Multiple Vitamin (MULTIVITAMIN ADULT PO) Take 1 tablet by mouth daily. (Patient not taking: Reported on 10/02/2021)   OVER THE COUNTER MEDICATION Apply 1 application topically as needed.  Theragesic Cream.  Applying to neck & back. (Patient not taking: Reported on 10/02/2021)   pregabalin (LYRICA) 75 MG capsule Take 1 capsule (75 mg total) by mouth 2 (two) times daily. (Patient not taking: Reported on 04/12/2021)   PROAIR HFA 108 (90 Base) MCG/ACT inhaler Inhale 2 puffs into the lungs every 4 (four) hours as needed for wheezing or shortness of breath. (Patient not taking: Reported on 10/02/2021)   SUMAtriptan (IMITREX) 20 MG/ACT nasal spray Place 1 spray (20 mg total) into the nose every 2 (two)  hours as needed for migraine or headache. May repeat in 2 hours if headache persists or recurs.  Maximum 2 sprays in 24 hours (Patient not taking: Reported on 10/02/2021)   topiramate (TOPAMAX) 25 MG tablet Take 1 tablet at bedtime for one week, then increase to 2 tablets at bedtime (Patient not taking: Reported on 10/02/2021)   vitamin B-12 (CYANOCOBALAMIN) 1000 MCG tablet Take 1,000 mcg by mouth daily. (Patient not taking: Reported on 10/02/2021)   Vitamin D, Ergocalciferol, (DRISDOL) 1.25 MG (50000 UNIT) CAPS capsule Take 1 capsule (50,000 Units total) by mouth every 7 (seven) days. (Patient not taking: Reported on 10/02/2021)   zinc gluconate 50 MG tablet Take 50 mg by mouth daily. (Patient not taking: Reported on 10/02/2021)   No facility-administered encounter medications on file as of 10/02/2021.    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   Asthma 2002   uses inhaler as PRN   Depression    as a child, and stress recently   Diabetes mellitus without complication ([place])    on meds   Fibrocystic breast 2005   Fibromyalgia    on meds   GERD (gastroesophageal reflux disease)    hx of   Hypertension    on meds   LGSIL (low grade squamous intraepithelial lesion) on Pap smear 2007   Libido, decreased 2010   Migraines    migraines - otc med prn, then maxalt if needed   Neuromuscular disorder ([place])    Ovarian cyst    Stargardt's disease 1986   Patient is legally blind   Tachycardia    per patient diagnosed in ER 2014, no problems currently   Trichomonas    Vaginosis 2008   Vitamin D deficiency    on meds   Past Surgical History:  Procedure Laterality Date   BREAST EXCISIONAL BIOPSY Left    benign   BREAST SURGERY  1986   biopsy - benign   COLONOSCOPY  2016   JMP-MAC-suprep(good)-[facility]   CYSTOSCOPY  12/26/2012    Procedure: CYSTOSCOPY;  Surgeon: Ena Dawley, MD;  Location: [facility];  Service: Gynecology;;   DILATION AND CURETTAGE OF UTERUS     ganglion cyst removed from wrist     left   LAPAROSCOPIC ASSISTED VAGINAL HYSTERECTOMY N/A 12/26/2012   Procedure: LAPAROSCOPIC ASSISTED VAGINAL HYSTERECTOMY Uterine Morcellation, Bilateral Salpingectomy, ;  Surgeon: Ena Dawley, MD;  Location: [facility];  Service: Gynecology;  Laterality: N/A;   MYOMECTOMY     In [place] - laparotomy   OOPHORECTOMY  2000   laparotomy in [place]   SHOULDER ARTHROSCOPY Left 2021   SHOULDER ARTHROSCOPY W/ ROTATOR CUFF REPAIR Right 2006   TONSILLECTOMY AND ADENOIDECTOMY     TUBAL LIGATION  2000   WISDOM  TOOTH EXTRACTION     x 2 teeth   WISDOM TOOTH EXTRACTION     Family History  Problem Relation Age of Onset   Arthritis Mother    Diabetes Mother    Hypertension Mother    Pulmonary embolism Mother        Cardiac arrest    Diverticulitis Mother    Heart disease Father    Heart attack Father    Mesothelioma Father    Hyperlipidemia Maternal Grandmother    Breast cancer Maternal Grandmother    Colitis Paternal Aunt        in 40's   Stomach cancer Paternal Aunt    Heart attack Paternal Aunt    Heart attack Paternal Uncle    Stroke Paternal 68    Breast cancer Maternal Aunt    Colon polyps Sister 21   Diverticulitis Sister    Colon cancer Neg Hx    Esophageal cancer Neg Hx    Rectal cancer Neg Hx    Social History   Socioeconomic History   Marital status: Married    Spouse name: Not on file   Number of children: 3   Years of education: Not on file   Highest education level: Some college, no degree  Occupational History   Not on file  Tobacco Use   Smoking status: Some Days    Packs/day: 0.10    Years: 20.00    Total pack years: 2.00    Types: Cigarettes    Start date: 02/28/2015   Smokeless tobacco: Never  Vaping Use   Vaping Use: Never used  Substance and Sexual Activity   Alcohol use:  No    Alcohol/week: 0.0 standard drinks of alcohol   Drug use: No   Sexual activity: Yes    Birth control/protection: Surgical, Condom  Other Topics Concern   Not on file  Social History Narrative   Is on disability    Married for 21 years    Three boys ( all three live locally)       She likes to hang out with friends. 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 Right handed   Drinks caffeine   One story home   Social Determinants of Health   Financial Resource Strain: Low Risk  (10/02/2021)   Overall Financial Resource Strain (CARDIA)    Difficulty of Paying Living Expenses: Not hard at all  Food Insecurity: No Food Insecurity (10/02/2021)   Hunger Vital Sign    Worried About Running Out of Food in the Last Year: Never true    Ran Out of Food in the Last Year: Never true  Transportation Needs: No Transportation Needs (10/02/2021)   PRAPARE - Hydrologist (Medical): No    Lack of Transportation (Non-Medical): No  Physical Activity: Inactive (10/02/2021)   Exercise Vital Sign    Days of Exercise per Week: 0 days    Minutes of Exercise per Session: 0 min  Stress: Stress Concern Present (10/02/2021)   [place]    Feeling of Stress : To some extent  Social Connections: Not on file    Tobacco Counseling Ready to quit: Yes Counseling given: Not Answered   Clinical Intake:  Pre-visit preparation completed: Yes  Pain : 0-10 Pain Score: 7  Pain Type: Chronic pain Pain Location: Back Pain Orientation: Lower, Mid, Upper Pain Descriptors / Indicators: Aching Pain Onset: More than a month  ago Pain Frequency: Intermittent     Nutritional Status: BMI > 30  Obese Nutritional Risks: None Diabetes: No  How often do you need to have someone help you when you read instructions, pamphlets, or other written materials from your doctor or pharmacy?: 1 -  Never What is the last grade level you completed in school?: some college, business school  Diabetic? Yes Nutrition Risk Assessment:  Has the patient had any N/V/D within the last 2 months?  No  Does the patient have any non-healing wounds?  No  Has the patient had any unintentional weight loss or weight gain?  No   Diabetes:  Is the patient diabetic?  Yes  If diabetic, was a CBG obtained today?  No  Did the patient bring in their glucometer from home?  No  How often do you monitor your CBG's? When feeling funny.   Financial Strains and Diabetes Management:  Are you having any financial strains with the device, your supplies or your medication? No .  Does the patient want to be seen by Chronic Care Management for management of their diabetes?  No  Would the patient like to be referred to a Nutritionist or for Diabetic Management?  No   Diabetic Exams:  Diabetic Eye Exam: Completed 12/29/2020 Diabetic Foot Exam: Overdue, Pt has been advised about the importance in completing this exam. Pt is scheduled for diabetic foot exam on next appointment.   Interpreter Needed?: No  Information entered by :: NAllen LPN   Activities of Daily Living    10/02/2021    4:14 PM  In your present state of health, do you have any difficulty performing the following activities:  Hearing? 0  Vision? 1  Comment legally blind  Difficulty concentrating or making decisions? 1  Comment some forgetness  Walking or climbing stairs? 0  Dressing or bathing? 0  Doing errands, shopping? 1  Comment legally blind, does not drive  Preparing Food and eating ? N  Using the Toilet? N  In the past six months, have you accidently leaked urine? Y  Do you have problems with loss of bowel control? N  Managing your Medications? N  Managing your Finances? N  Housekeeping or managing your Housekeeping? N    Patient Care Team: [name], NP as PCP - General (Family Medicine) Earnie Larsson, [facility] as  Pharmacist (Pharmacist)  Indicate any recent Medical Services you may have received from other than [facility] providers in the past year (date may be approximate).     Assessment:   This is a routine wellness examination for Karen Hardin.  Hearing/Vision screen Vision Screening - Comments:: Regular eye exams, [facility]  Dietary issues and exercise activities discussed: Current Exercise Habits: The patient does not participate in regular exercise at present   Goals Addressed             This Visit's Progress    Patient Stated       10/02/2021, wants to lose weight       Depression Screen    10/02/2021    4:14 PM 04/27/2021    1:35 PM 11/27/2019    1:17 PM  PHQ 2/9 Scores  PHQ - 2 Score 0 2 0  PHQ- 9 Score  3     Fall Risk    10/02/2021    4:14 PM 04/12/2021   11:07 AM  [place] in the past year? 0 0  Number falls in past yr: 0 0  Injury  with Fall? 0 0  Risk for fall due to : Medication side effect;Impaired vision   Follow up Falls evaluation completed;Education provided;Falls prevention discussed     FALL RISK PREVENTION PERTAINING TO THE HOME:  Any stairs in or around the home? No  If so, are there any without handrails? N/a Home free of loose throw rugs in walkways, pet beds, electrical cords, etc? Yes  Adequate lighting in your home to reduce risk of falls?  Legally blind  ASSISTIVE DEVICES UTILIZED TO PREVENT FALLS:  Life alert? No  Use of a cane, walker or w/c? No  Grab bars in the bathroom? Yes  Shower chair or bench in shower? No  Elevated toilet seat or a handicapped toilet? No   TIMED UP AND GO:  Was the test performed? No .      Cognitive Function:        10/02/2021    4:17 PM  6CIT Screen  What Year? 0 points  What month? 0 points  What time? 0 points  Count back from 20 0 points  Months in reverse 0 points  Repeat phrase 0 points  Total Score 0 points    Immunizations Immunization History  Administered Date(s)  Administered   Influenza,inj,Quad PF,6+ Mos 12/27/2012, 03/08/2015, 01/24/2016, 03/29/2017, 11/20/2018   Janssen (J&J) SARS-COV-2 Vaccination 04/21/2019   Moderna SARS-COV2 Booster Vaccination 04/25/2020   Pneumococcal Polysaccharide-23 12/27/2012   Tdap 01/24/2016    TDAP status: Up to date  Flu Vaccine status: Due, Education has been provided regarding the importance of this vaccine. Advised may receive this vaccine at local pharmacy or Health Dept. Aware to provide a copy of the vaccination record if obtained from local pharmacy or Health Dept. Verbalized acceptance and understanding.  Pneumococcal vaccine status: Up to date  Covid-19 vaccine status: Completed vaccines  Qualifies for Shingles Vaccine? Yes   Zostavax completed No   Shingrix Completed?: No.    Education has been provided regarding the importance of this vaccine. Patient has been advised to call insurance company to determine out of pocket expense if they have not yet received this vaccine. Advised may also receive vaccine at local pharmacy or Health Dept. Verbalized acceptance and understanding.  Screening Tests Health Maintenance  Topic Date Due   Hepatitis C Screening  Never done   Zoster Vaccines- Shingrix (1 of 2) Never done   FOOT EXAM  08/06/2019   COVID-19 Vaccine (2 - Booster for Janssen series) 06/20/2020   HEMOGLOBIN A1C  05/04/2021   INFLUENZA VACCINE  09/12/2021   HIV Screening  05/10/2024 (Originally 10/11/1980)   OPHTHALMOLOGY EXAM  12/29/2021   MAMMOGRAM  01/21/2023   COLONOSCOPY (Pts 45-48yr Insurance coverage will need to be confirmed)  07/20/2025   TETANUS/TDAP  01/23/2026   HPV VACCINES  Aged Out    Health Maintenance  Health Maintenance Due  Topic Date Due   Hepatitis C Screening  Never done   Zoster Vaccines- Shingrix (1 of 2) Never done   FOOT EXAM  08/06/2019   COVID-19 Vaccine (2 - Booster for Janssen series) 06/20/2020   HEMOGLOBIN A1C  05/04/2021   INFLUENZA VACCINE  09/12/2021     Colorectal cancer screening: Type of screening: Colonoscopy. Completed 07/20/2020. Repeat every 5 years  Mammogram status: Completed 01/20/2021. Repeat every year  Bone Density status: n/a  Lung Cancer Screening: (Low Dose CT Chest recommended if Age 57-80years, 30 pack-year currently smoking OR have quit w/in 15years.) does not qualify.   Lung Cancer Screening Referral:  no  Additional Screening:  Hepatitis C Screening: does qualify;   Vision Screening: Recommended annual ophthalmology exams for early detection of glaucoma and other disorders of the eye. Is the patient up to date with their annual eye exam?  Yes  Who is the provider or what is the name of the office in which the patient attends annual eye exams? [facility] If pt is not established with a provider, would they like to be referred to a provider to establish care? No .   Dental Screening: Recommended annual dental exams for proper oral hygiene  Community Resource Referral / Chronic Care Management: CRR required this visit?  No   CCM required this visit?  No      Plan:     I have personally reviewed and noted the following in the patient's chart:   Medical and social history Use of alcohol, tobacco or illicit drugs  Current medications and supplements including opioid prescriptions. Patient is not currently taking opioid prescriptions. Functional ability and status Nutritional status Physical activity Advanced directives List of other physicians Hospitalizations, surgeries, and ER visits in previous 12 months Vitals Screenings to include cognitive, depression, and falls Referrals and appointments  In addition, I have reviewed and discussed with patient certain preventive protocols, quality metrics, and best practice recommendations. A written personalized care plan for preventive services as well as general preventive health recommendations were provided to patient.     Kellie Simmering,  LPN   10/22/3110   Nurse Notes: none  Due to this being a virtual visit, the after visit summary with patients personalized plan was offered to patient via mail or my-chart.  to pick up at office at next visit

## 2021-10-02 NOTE — Patient Instructions (Signed)
Karen Hardin , Thank you for taking time to come for your Medicare Wellness Visit. I appreciate your ongoing commitment to your health goals. Please review the following plan we discussed and let me know if I can assist you in the future.   Screening recommendations/referrals: Colonoscopy: completed 07/20/2020, due 07/20/2025 Mammogram: completed 01/20/2021, due 01/21/2022 Bone Density: n/a Recommended yearly ophthalmology/optometry visit for glaucoma screening and checkup Recommended yearly dental visit for hygiene and checkup  Vaccinations: Influenza vaccine: due Pneumococcal vaccine: n/a Tdap vaccine: completed 01/24/2016, due 01/23/2026 Shingles vaccine: discussed  Covid-19:  04/21/2019, 04/25/2020  Advanced directives: Advance directive discussed with you today.   Conditions/risks identified: smoking  Next appointment: Follow up in one year for your annual wellness visit.   Preventive Care 40-64 Years, Female Preventive care refers to lifestyle choices and visits with your health care provider that can promote health and wellness. What does preventive care include? A yearly physical exam. This is also called an annual well check. Dental exams once or twice a year. Routine eye exams. Ask your health care provider how often you should have your eyes checked. Personal lifestyle choices, including: Daily care of your teeth and gums. Regular physical activity. Eating a healthy diet. Avoiding tobacco and drug use. Limiting alcohol use. Practicing safe sex. Taking low-dose aspirin daily starting at age 7. Taking vitamin and mineral supplements as recommended by your health care provider. What happens during an annual well check? The services and screenings done by your health care provider during your annual well check will depend on your age, overall health, lifestyle risk factors, and family history of disease. Counseling  Your health care provider may ask you questions about  your: Alcohol use. Tobacco use. Drug use. Emotional well-being. Home and relationship well-being. Sexual activity. Eating habits. Work and work Statistician. Method of birth control. Menstrual cycle. Pregnancy history. Screening  You may have the following tests or measurements: Height, weight, and BMI. Blood pressure. Lipid and cholesterol levels. These may be checked every 5 years, or more frequently if you are over 31 years old. Skin check. Lung cancer screening. You may have this screening every year starting at age 90 if you have a 30-pack-year history of smoking and currently smoke or have quit within the past 15 years. Fecal occult blood test (FOBT) of the stool. You may have this test every year starting at age 75. Flexible sigmoidoscopy or colonoscopy. You may have a sigmoidoscopy every 5 years or a colonoscopy every 10 years starting at age 88. Hepatitis C blood test. Hepatitis B blood test. Sexually transmitted disease (STD) testing. Diabetes screening. This is done by checking your blood sugar (glucose) after you have not eaten for a while (fasting). You may have this done every 1-3 years. Mammogram. This may be done every 1-2 years. Talk to your health care provider about when you should start having regular mammograms. This may depend on whether you have a family history of breast cancer. BRCA-related cancer screening. This may be done if you have a family history of breast, ovarian, tubal, or peritoneal cancers. Pelvic exam and Pap test. This may be done every 3 years starting at age 85. Starting at age 72, this may be done every 5 years if you have a Pap test in combination with an HPV test. Bone density scan. This is done to screen for osteoporosis. You may have this scan if you are at high risk for osteoporosis. Discuss your test results, treatment options, and if necessary, the  need for more tests with your health care provider. Vaccines  Your health care provider may  recommend certain vaccines, such as: Influenza vaccine. This is recommended every year. Tetanus, diphtheria, and acellular pertussis (Tdap, Td) vaccine. You may need a Td booster every 10 years. Zoster vaccine. You may need this after age 47. Pneumococcal 13-valent conjugate (PCV13) vaccine. You may need this if you have certain conditions and were not previously vaccinated. Pneumococcal polysaccharide (PPSV23) vaccine. You may need one or two doses if you smoke cigarettes or if you have certain conditions. Talk to your health care provider about which screenings and vaccines you need and how often you need them. This information is not intended to replace advice given to you by your health care provider. Make sure you discuss any questions you have with your health care provider. Document Released: 02/25/2015 Document Revised: 10/19/2015 Document Reviewed: 11/30/2014 Elsevier Interactive Patient Education  2017 [place] Prevention in the Home Falls can cause injuries. They can happen to people of all ages. There are many things you can do to make your home safe and to help prevent falls. What can I do on the outside of my home? Regularly fix the edges of walkways and driveways and fix any cracks. Remove anything that might make you trip as you walk through a door, such as a raised step or threshold. Trim any bushes or trees on the path to your home. Use bright outdoor lighting. Clear any walking paths of anything that might make someone trip, such as rocks or tools. Regularly check to see if handrails are loose or broken. Make sure that both sides of any steps have handrails. Any raised decks and porches should have guardrails on the edges. Have any leaves, snow, or ice cleared regularly. Use sand or salt on walking paths during winter. Clean up any spills in your garage right away. This includes oil or grease spills. What can I do in the bathroom? Use night lights. Install  grab bars by the toilet and in the tub and shower. Do not use towel bars as grab bars. Use non-skid mats or decals in the tub or shower. If you need to sit down in the shower, use a plastic, non-slip stool. Keep the floor dry. Clean up any water that spills on the floor as soon as it happens. Remove soap buildup in the tub or shower regularly. Attach bath mats securely with double-sided non-slip rug tape. Do not have throw rugs and other things on the floor that can make you trip. What can I do in the bedroom? Use night lights. Make sure that you have a light by your bed that is easy to reach. Do not use any sheets or blankets that are too big for your bed. They should not hang down onto the floor. Have a firm chair that has side arms. You can use this for support while you get dressed. Do not have throw rugs and other things on the floor that can make you trip. What can I do in the kitchen? Clean up any spills right away. Avoid walking on wet floors. Keep items that you use a lot in easy-to-reach places. If you need to reach something above you, use a strong step stool that has a grab bar. Keep electrical cords out of the way. Do not use floor polish or wax that makes floors slippery. If you must use wax, use non-skid floor wax. Do not have throw rugs and  other things on the floor that can make you trip. What can I do with my stairs? Do not leave any items on the stairs. Make sure that there are handrails on both sides of the stairs and use them. Fix handrails that are broken or loose. Make sure that handrails are as long as the stairways. Check any carpeting to make sure that it is firmly attached to the stairs. Fix any carpet that is loose or worn. Avoid having throw rugs at the top or bottom of the stairs. If you do have throw rugs, attach them to the floor with carpet tape. Make sure that you have a light switch at the top of the stairs and the bottom of the stairs. If you do not have  them, ask someone to add them for you. What else can I do to help prevent falls? Wear shoes that: Do not have high heels. Have rubber bottoms. Are comfortable and fit you well. Are closed at the toe. Do not wear sandals. If you use a stepladder: Make sure that it is fully opened. Do not climb a closed stepladder. Make sure that both sides of the stepladder are locked into place. Ask someone to hold it for you, if possible. Clearly mark and make sure that you can see: Any grab bars or handrails. First and last steps. Where the edge of each step is. Use tools that help you move around (mobility aids) if they are needed. These include: Canes. Walkers. Scooters. Crutches. Turn on the lights when you go into a dark area. Replace any light bulbs as soon as they burn out. Set up your furniture so you have a clear path. Avoid moving your furniture around. If any of your floors are uneven, fix them. If there are any pets around you, be aware of where they are. Review your medicines with your doctor. Some medicines can make you feel dizzy. This can increase your chance of falling. Ask your doctor what other things that you can do to help prevent falls. This information is not intended to replace advice given to you by your health care provider. Make sure you discuss any questions you have with your health care provider. Document Released: 11/25/2008 Document Revised: 07/07/2015 Document Reviewed: 03/05/2014 Elsevier Interactive Patient Education  2017 [employer].

## 2021-10-06 ENCOUNTER — Encounter: Payer: PPO | Admitting: Adult Health

## 2021-10-25 ENCOUNTER — Telehealth: Payer: Self-pay | Admitting: Pharmacist

## 2021-10-25 NOTE — Chronic Care Management (AMB) (Signed)
Chronic Care Management Pharmacy Assistant   Name: Karen Hardin  MRN: [id number] DOB: [birth year]  Reason for Encounter: Disease State   Conditions to be addressed/monitored: General Assessment  Recent office visits:   10/27/21 [name], NP - Patient presented for Routine general medical examination at a health care facility and other concerns. Prescribed Pregabalin. Stopped Methylprednisolone.  10/02/21 Kellie Simmering, LPN - Patient presented for Medicare Annual Wellness exam. No medication changes. Patient voiced goal of weight loss.  04/27/21 Nafziger, Tommi Rumps, NP - Patient presented for Acute left sided back pain with left sided sciatica. Prescribed Methylprednisolone 4 mg. Stopped Propranolol.  Recent consult visits:  None  Hospital visits:  None in previous 6 months  Medications: Outpatient Encounter Medications as of 10/25/2021  Medication Sig   Ascorbic Acid (VITAMIN C PO) Take 750 mg by mouth daily. (Patient not taking: Reported on 10/02/2021)   Blood Glucose Monitoring Suppl ([place]) w/Device KIT Test once daily. Dx: E11.9   Cholecalciferol (VITAMIN D3) 125 MCG (5000 UT) TABS Take 1 tablet by mouth daily. (Patient not taking: Reported on 10/02/2021)   cyclobenzaprine (FLEXERIL) 10 MG tablet TAKE 1 TABLET BY MOUTH EVERY 8 HOURS AS NEEDED FOR MUSCLE SPASM (Patient not taking: Reported on 10/02/2021)   DULoxetine (CYMBALTA) 30 MG capsule Take 1 capsule (30 mg total) by mouth daily. (Patient not taking: Reported on 10/02/2021)   Ferrous Sulfate (IRON PO) Take 65 mg by mouth daily. (Patient not taking: Reported on 10/02/2021)   glipiZIDE (GLUCOTROL XL) 2.5 MG 24 hr tablet Take 1 tablet by mouth once daily with breakfast (Patient not taking: Reported on 10/02/2021)   glucose blood test strip Use as instructed   lisinopril (ZESTRIL) 2.5 MG tablet Take 1 tablet by mouth once daily (Patient not taking: Reported on 10/02/2021)   methylPREDNISolone (MEDROL  DOSEPAK) 4 MG TBPK tablet Take as directed (Patient not taking: Reported on 10/02/2021)   Multiple Vitamin (MULTIVITAMIN ADULT PO) Take 1 tablet by mouth daily. (Patient not taking: Reported on 6/76/7209)   OneTouch Delica Lancets 47S MISC Check blood sugars twice a day   OVER THE COUNTER MEDICATION Apply 1 application topically as needed. Theragesic Cream.  Applying to neck & back. (Patient not taking: Reported on 10/02/2021)   pregabalin (LYRICA) 75 MG capsule Take 1 capsule (75 mg total) by mouth 2 (two) times daily. (Patient not taking: Reported on 04/12/2021)   PROAIR HFA 108 (90 Base) MCG/ACT inhaler Inhale 2 puffs into the lungs every 4 (four) hours as needed for wheezing or shortness of breath. (Patient not taking: Reported on 10/02/2021)   SUMAtriptan (IMITREX) 20 MG/ACT nasal spray Place 1 spray (20 mg total) into the nose every 2 (two) hours as needed for migraine or headache. May repeat in 2 hours if headache persists or recurs.  Maximum 2 sprays in 24 hours (Patient not taking: Reported on 10/02/2021)   topiramate (TOPAMAX) 25 MG tablet Take 1 tablet at bedtime for one week, then increase to 2 tablets at bedtime (Patient not taking: Reported on 10/02/2021)   vitamin B-12 (CYANOCOBALAMIN) 1000 MCG tablet Take 1,000 mcg by mouth daily. (Patient not taking: Reported on 10/02/2021)   Vitamin D, Ergocalciferol, (DRISDOL) 1.25 MG (50000 UNIT) CAPS capsule Take 1 capsule (50,000 Units total) by mouth every 7 (seven) days. (Patient not taking: Reported on 10/02/2021)   zinc gluconate 50 MG tablet Take 50 mg by mouth daily. (Patient not taking: Reported on 10/02/2021)   No facility-administered encounter  medications on file as of 10/25/2021.   Central for General Review Call   Adherence Review:  Does the Clinical Pharmacist Assistant have access to adherence rates? Yes Adherence rates for STAR metric medications  Lisinopril (Zestril) 2.5 mg  - Last filled 04/14/21 90 DS at  [facility] Lisinopril (Zestril) 2.5 mg  - Last filled 02/01/21 90 DS at [facility] Glipizide (Glucotrol) 2.5 mg - Last filled 04/14/21 90 DS at [facility] Glipizide (Glucotrol) 2.5 mg - Last filled 02/01/21 90 DS at [facility]   Does the patient have >5 day gap between last estimated fill dates for any of the above medications or other medication gaps? Yes  Per note on 10/27/21 pt instructed to restart taking as she had stopped.   Disease State Questions:  Able to connect with Patient? No  Care Gaps: Diabetic Kidney eval - Overdue Hepatitis C Screen - Overdue Foot Exam - Overdue COVID Booster - Overdue HGB A1C - Overdue Flu Vaccine - Overdue HIV Screen - Postponed CCM- Need BP- 130/86 04/27/21 AWV- 8/23 Lab Results  Component Value Date   HGBA1C 6.4 (A) 11/04/2020      Star Rating Drugs: Lisinopril (Zestril) 2.5 mg  - Last filled 04/14/21 90 DS at [facility] Glipizide (Glucotrol) 2.5 mg - Last filled 04/14/21 90 DS at [facility] Pharmacist Assistant [phone]

## 2021-10-27 ENCOUNTER — Ambulatory Visit (INDEPENDENT_AMBULATORY_CARE_PROVIDER_SITE_OTHER): Payer: PPO

## 2021-10-27 ENCOUNTER — Ambulatory Visit (INDEPENDENT_AMBULATORY_CARE_PROVIDER_SITE_OTHER): Payer: PPO | Admitting: Adult Health

## 2021-10-27 VITALS — BP 120/70 | HR 80 | Temp 98.0°F | Ht 67.5 in | Wt 211.0 lb

## 2021-10-27 DIAGNOSIS — Z23 Encounter for immunization: Secondary | ICD-10-CM

## 2021-10-27 DIAGNOSIS — Z0000 Encounter for general adult medical examination without abnormal findings: Secondary | ICD-10-CM | POA: Diagnosis not present

## 2021-10-27 DIAGNOSIS — Z87891 Personal history of nicotine dependence: Secondary | ICD-10-CM | POA: Diagnosis not present

## 2021-10-27 DIAGNOSIS — I10 Essential (primary) hypertension: Secondary | ICD-10-CM | POA: Diagnosis not present

## 2021-10-27 DIAGNOSIS — Z720 Tobacco use: Secondary | ICD-10-CM | POA: Diagnosis not present

## 2021-10-27 DIAGNOSIS — J984 Other disorders of lung: Secondary | ICD-10-CM | POA: Diagnosis not present

## 2021-10-27 DIAGNOSIS — M797 Fibromyalgia: Secondary | ICD-10-CM | POA: Diagnosis not present

## 2021-10-27 DIAGNOSIS — E1169 Type 2 diabetes mellitus with other specified complication: Secondary | ICD-10-CM | POA: Diagnosis not present

## 2021-10-27 LAB — COMPREHENSIVE METABOLIC PANEL
ALT: 11 U/L (ref 0–35)
AST: 13 U/L (ref 0–37)
Albumin: 4.2 g/dL (ref 3.5–5.2)
Alkaline Phosphatase: 92 U/L (ref 39–117)
BUN: 7 mg/dL (ref 6–23)
CO2: 26 mEq/L (ref 19–32)
Calcium: 10 mg/dL (ref 8.4–10.5)
Chloride: 105 mEq/L (ref 96–112)
Creatinine, Ser: 0.81 mg/dL (ref 0.40–1.20)
GFR: 80.79 mL/min (ref >60.00)
Glucose, Bld: 92 mg/dL (ref 70–99)
Potassium: 3.6 mEq/L (ref 3.5–5.1)
Sodium: 140 mEq/L (ref 135–145)
Total Bilirubin: 0.7 mg/dL (ref 0.2–1.2)
Total Protein: 7.6 g/dL (ref 6.0–8.3)

## 2021-10-27 LAB — CBC WITH DIFFERENTIAL/PLATELET
Basophils Absolute: 0.1 10*3/uL (ref 0.0–0.1)
Basophils Relative: 0.9 % (ref 0.0–3.0)
Eosinophils Absolute: 0.1 10*3/uL (ref 0.0–0.7)
Eosinophils Relative: 0.9 % (ref 0.0–5.0)
HCT: 40.2 % (ref 36.0–46.0)
Hemoglobin: 13.2 g/dL (ref 12.0–15.0)
Lymphocytes Relative: 37.1 % (ref 12.0–46.0)
Lymphs Abs: 3.7 10*3/uL (ref 0.7–4.0)
MCHC: 32.8 g/dL (ref 30.0–36.0)
MCV: 87.5 fl (ref 78.0–100.0)
Monocytes Absolute: 0.6 10*3/uL (ref 0.1–1.0)
Monocytes Relative: 6.1 % (ref 3.0–12.0)
Neutro Abs: 5.5 10*3/uL (ref 1.4–7.7)
Neutrophils Relative %: 55 % (ref 43.0–77.0)
Platelets: 278 10*3/uL (ref 150.0–400.0)
RBC: 4.6 Mil/uL (ref 3.87–5.11)
RDW: 13.1 % (ref 11.5–15.5)
WBC: 10.1 10*3/uL (ref 4.0–10.5)

## 2021-10-27 LAB — LIPID PANEL
Cholesterol: 159 mg/dL (ref 0–200)
HDL: 43.8 mg/dL (ref >39.00)
LDL Cholesterol: 96 mg/dL (ref 0–99)
NonHDL: 115.37
Total CHOL/HDL Ratio: 4
Triglycerides: 97 mg/dL (ref 0.0–149.0)
VLDL: 19.4 mg/dL (ref 0.0–40.0)

## 2021-10-27 LAB — HEMOGLOBIN A1C: Hgb A1c MFr Bld: 6.8 % — ABNORMAL HIGH (ref 4.6–6.5)

## 2021-10-27 LAB — VITAMIN D 25 HYDROXY (VIT D DEFICIENCY, FRACTURES): VITD: 20.2 ng/mL — ABNORMAL LOW (ref 30.00–100.00)

## 2021-10-27 LAB — TSH: TSH: 1.01 u[IU]/mL (ref 0.35–5.50)

## 2021-10-27 MED ORDER — PREGABALIN 150 MG PO CAPS: 150.0000 mg | ORAL_CAPSULE | Freq: Two times a day (BID) | ORAL | 2 refills | Status: DC

## 2021-10-27 MED ORDER — DULOXETINE HCL 30 MG PO CPEP: 30.0000 mg | ORAL_CAPSULE | Freq: Every day | ORAL | 1 refills | Status: DC

## 2021-10-27 MED ORDER — CYCLOBENZAPRINE HCL 10 MG PO TABS: ORAL_TABLET | ORAL | 1 refills | Status: DC

## 2021-10-27 MED ORDER — LISINOPRIL 2.5 MG PO TABS: 2.5000 mg | ORAL_TABLET | Freq: Every day | ORAL | 3 refills | Status: DC

## 2021-10-27 MED ORDER — ONETOUCH DELICA LANCETS 33G MISC: 3 refills | Status: DC

## 2021-10-27 MED ORDER — GLUCOSE BLOOD VI STRP: ORAL_STRIP | 12 refills | Status: DC

## 2021-10-27 MED ORDER — GLIPIZIDE ER 2.5 MG PO TB24: 2.5000 mg | ORAL_TABLET | Freq: Every day | ORAL | 0 refills | Status: DC

## 2021-10-27 NOTE — Progress Notes (Signed)
Subjective:    Patient ID: Karen Hardin, female    DOB: 11/16/1965, 56 y.o.   MRN: [id number]  HPI Patient presents for yearly preventative medicine examination. She is a pleasant 56 year old female who  has a past medical history of Allergy, Anemia, Arthritis, Asthma (2002), Depression, Diabetes mellitus without complication ([place]), Fibrocystic breast (2005), Fibromyalgia, GERD (gastroesophageal reflux disease), Hypertension, LGSIL (low grade squamous intraepithelial lesion) on Pap smear (2007), Libido, decreased (2010), Migraines, Neuromuscular disorder ([place]), Ovarian cyst, Stargardt's disease (1986), Tachycardia, Trichomonas, Vaginosis (2008), and Vitamin D deficiency.  She has not taken any of her medication in multiple weeks.   DM Type 2 -managed with glipizide 2.5 mg extended release daily. Lab Results  Component Value Date   HGBA1C 6.4 (A) 11/04/2020   HTN -managed with lisinopril 2.5 mg daily. BP Readings from Last 3 Encounters:  04/27/21 130/86  04/12/21 135/83  02/26/21 138/79   Migraine Headaches -was seen by neurology in March 2023.  She was started on Topamax 50 mg at bedtime and to manage triptan 20 mg as needed. She reports that she has not started Topamax as she did not like the side effect of vision loss. She has not follow up with Neurology. Reports that she has had fewer migraines   Fibromyalgia - Has been prescribed Cymbalta 60 mg, Lyrica 75 mg BID, and Flexeril 10 mg Q8H PRN. She did not notice any improvement with the Lyrica 75 mg. She reports being in constant pain   Tobacco Use - continues to smoke, 1 pack lasts 3-4 days. She would like to have a chest xray done   All immunizations and health maintenance protocols were reviewed with the patient and needed orders were placed.  Appropriate screening laboratory values were ordered for the patient including screening of hyperlipidemia, renal function and hepatic function. If indicated by BPH, a PSA was  ordered.  Medication reconciliation,  past medical history, social history, problem list and allergies were reviewed in detail with the patient  Goals were established with regard to weight loss, exercise, and  diet in compliance with medications. She has not been active but tries to eat healthy  Wt Readings from Last 3 Encounters:  10/27/21 211 lb (95.7 kg)  10/02/21 220 lb (99.8 kg)  04/27/21 217 lb (98.4 kg)    Review of Systems  Constitutional:  Positive for fatigue.  HENT: Negative.    Eyes: Negative.   Respiratory: Negative.    Cardiovascular: Negative.   Endocrine: Negative.   Genitourinary: Negative.   Musculoskeletal:  Positive for arthralgias, back pain and myalgias.  Skin: Negative.   Allergic/Immunologic: Negative.   Neurological: Negative.   Hematological: Negative.   Psychiatric/Behavioral: Negative.    All other systems reviewed and are negative.   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   Asthma 2002   uses inhaler as PRN   Depression    as a child, and stress recently   Diabetes mellitus without complication ([place])    on meds   Fibrocystic breast 2005   Fibromyalgia    on meds   GERD (gastroesophageal reflux disease)    hx of   Hypertension    on meds   LGSIL (low grade squamous intraepithelial lesion) on Pap smear 2007   Libido, decreased 2010   Migraines  migraines - otc med prn, then maxalt if needed   Neuromuscular disorder [facility])    Ovarian cyst    Stargardt's disease 1986   Patient is legally blind   Tachycardia    per patient diagnosed in ER 2014, no problems currently   Trichomonas    Vaginosis 2008   Vitamin D deficiency    on meds    Social History   Socioeconomic History   Marital status: Married    Spouse name: Not on file   Number of children: 3   Years of education: Not on file   Highest education  level: Some college, no degree  Occupational History   Not on file  Tobacco Use   Smoking status: Some Days    Packs/day: 0.10    Years: 20.00    Total pack years: 2.00    Types: Cigarettes    Start date: 02/28/2015   Smokeless tobacco: Never  Vaping Use   Vaping Use: Never used  Substance and Sexual Activity   Alcohol use: No    Alcohol/week: 0.0 standard drinks of alcohol   Drug use: No   Sexual activity: Yes    Birth control/protection: Surgical, Condom  Other Topics Concern   Not on file  Social History Narrative   Is on disability    Married for 21 years    Three boys ( all three live locally)       She likes to hang out with friends. 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 Right handed   Drinks caffeine   One story home   Social Determinants of Health   Financial Resource Strain: Low Risk  (10/02/2021)   Overall Financial Resource Strain (CARDIA)    Difficulty of Paying Living Expenses: Not hard at all  Food Insecurity: No Food Insecurity (10/02/2021)   Hunger Vital Sign    Worried About Running Out of Food in the Last Year: Never true    Ran Out of Food in the Last Year: Never true  Transportation Needs: No Transportation Needs (10/02/2021)   PRAPARE - Hydrologist (Medical): No    Lack of Transportation (Non-Medical): No  Physical Activity: Inactive (10/02/2021)   Exercise Vital Sign    Days of Exercise per Week: 0 days    Minutes of Exercise per Session: 0 min  Stress: Stress Concern Present (10/02/2021)   [place]    Feeling of Stress : To some extent  Social Connections: Not on file  Intimate Partner Violence: Not on file    Past Surgical History:  Procedure Laterality Date   BREAST EXCISIONAL BIOPSY Left    benign   BREAST SURGERY  1986   biopsy - benign   COLONOSCOPY  2016   JMP-MAC-suprep(good)-SSP   CYSTOSCOPY   12/26/2012   Procedure: CYSTOSCOPY;  Surgeon: Ena Dawley, MD;  Location: [facility];  Service: Gynecology;;   DILATION AND CURETTAGE OF UTERUS     ganglion cyst removed from wrist     left   LAPAROSCOPIC ASSISTED VAGINAL HYSTERECTOMY N/A 12/26/2012   Procedure: LAPAROSCOPIC ASSISTED VAGINAL HYSTERECTOMY Uterine Morcellation, Bilateral Salpingectomy, ;  Surgeon: Ena Dawley, MD;  Location: [facility];  Service: Gynecology;  Laterality: N/A;   MYOMECTOMY     In [place] - laparotomy   OOPHORECTOMY  2000   laparotomy in [place]   SHOULDER ARTHROSCOPY Left 2021   SHOULDER ARTHROSCOPY W/ ROTATOR  CUFF REPAIR Right 2006   TONSILLECTOMY AND ADENOIDECTOMY     TUBAL LIGATION  2000   WISDOM TOOTH EXTRACTION     x 2 teeth   WISDOM TOOTH EXTRACTION      Family History  Problem Relation Age of Onset   Arthritis Mother    Diabetes Mother    Hypertension Mother    Pulmonary embolism Mother        Cardiac arrest    Diverticulitis Mother    Heart disease Father    Heart attack Father    Mesothelioma Father    Hyperlipidemia Maternal Grandmother    Breast cancer Maternal Grandmother    Colitis Paternal Aunt        in 70's   Stomach cancer Paternal Aunt    Heart attack Paternal Aunt    Heart attack Paternal Uncle    Stroke Paternal Aunt    Breast cancer Maternal Aunt    Colon polyps Sister 70   Diverticulitis Sister    Colon cancer Neg Hx    Esophageal cancer Neg Hx    Rectal cancer Neg Hx     Allergies  Allergen Reactions   Cholestatin     Current Outpatient Medications on File Prior to Visit  Medication Sig Dispense Refill   Ascorbic Acid (VITAMIN C PO) Take 750 mg by mouth daily.     Blood Glucose Monitoring Suppl ([place]) w/Device KIT Test once daily. Dx: E11.9 1 each 0   Cholecalciferol (VITAMIN D3) 125 MCG (5000 UT) TABS Take 1 tablet by mouth daily.     cyclobenzaprine (FLEXERIL) 10 MG tablet TAKE 1 TABLET BY MOUTH EVERY 8 HOURS AS NEEDED FOR MUSCLE  SPASM 30 tablet 0   DULoxetine (CYMBALTA) 30 MG capsule Take 1 capsule (30 mg total) by mouth daily. 90 capsule 0   Ferrous Sulfate (IRON PO) Take 65 mg by mouth daily.     glipiZIDE (GLUCOTROL XL) 2.5 MG 24 hr tablet Take 1 tablet by mouth once daily with breakfast 90 tablet 0   glucose blood test strip Use as instructed 200 each 3   lisinopril (ZESTRIL) 2.5 MG tablet Take 1 tablet by mouth once daily 90 tablet 3   Multiple Vitamin (MULTIVITAMIN ADULT PO) Take 1 tablet by mouth daily.     OneTouch Delica Lancets 76H MISC Check blood sugars twice a day 200 each 3   OVER THE COUNTER MEDICATION Apply 1 application  topically as needed. Theragesic Cream.  Applying to neck & back.     PROAIR HFA 108 (90 Base) MCG/ACT inhaler Inhale 2 puffs into the lungs every 4 (four) hours as needed for wheezing or shortness of breath. 18 g 2   SUMAtriptan (IMITREX) 20 MG/ACT nasal spray Place 1 spray (20 mg total) into the nose every 2 (two) hours as needed for migraine or headache. May repeat in 2 hours if headache persists or recurs.  Maximum 2 sprays in 24 hours 6 each 5   vitamin B-12 (CYANOCOBALAMIN) 1000 MCG tablet Take 1,000 mcg by mouth daily.     Vitamin D, Ergocalciferol, (DRISDOL) 1.25 MG (50000 UNIT) CAPS capsule Take 1 capsule (50,000 Units total) by mouth every 7 (seven) days. 12 capsule 0   zinc gluconate 50 MG tablet Take 50 mg by mouth daily.     pregabalin (LYRICA) 75 MG capsule Take 1 capsule (75 mg total) by mouth 2 (two) times daily. (Patient not taking: Reported on 04/12/2021) 60 capsule 2   topiramate (  TOPAMAX) 25 MG tablet Take 1 tablet at bedtime for one week, then increase to 2 tablets at bedtime (Patient not taking: Reported on 10/27/2021) 60 tablet 0   No current facility-administered medications on file prior to visit.    BP 120/70   Pulse 80   Temp 98 F (36.7 C) (Oral)   Ht 5' 7.5" (1.715 m)   Wt 211 lb (95.7 kg)   LMP 12/14/2012   SpO2 98%   BMI 32.56 kg/m       Objective:    Physical Exam Vitals and nursing note reviewed.  Constitutional:      General: She is not in acute distress.    Appearance: Normal appearance. She is well-developed. She is obese. She is not ill-appearing.  HENT:     Head: Normocephalic and atraumatic.     Right Ear: Tympanic membrane, ear canal and external ear normal. There is no impacted cerumen.     Left Ear: Tympanic membrane, ear canal and external ear normal. There is no impacted cerumen.     Nose: Nose normal. No congestion or rhinorrhea.     Mouth/Throat:     Mouth: Mucous membranes are moist.     Pharynx: Oropharynx is clear. No oropharyngeal exudate or posterior oropharyngeal erythema.  Eyes:     General:        Right eye: No discharge.        Left eye: No discharge.     Extraocular Movements: Extraocular movements intact.     Conjunctiva/sclera: Conjunctivae normal.     Pupils: Pupils are equal, round, and reactive to light.  Neck:     Thyroid: No thyromegaly.     Vascular: No carotid bruit.     Trachea: No tracheal deviation.  Cardiovascular:     Rate and Rhythm: Normal rate and regular rhythm.     Pulses: Normal pulses.     Heart sounds: Normal heart sounds. No murmur heard.    No friction rub. No gallop.  Pulmonary:     Effort: Pulmonary effort is normal. No respiratory distress.     Breath sounds: Normal breath sounds. No stridor. No wheezing, rhonchi or rales.  Chest:     Chest wall: No tenderness.  Abdominal:     General: Abdomen is flat. Bowel sounds are normal. There is no distension.     Palpations: Abdomen is soft. There is no mass.     Tenderness: There is no abdominal tenderness. There is no right CVA tenderness, left CVA tenderness, guarding or rebound.     Hernia: No hernia is present.  Musculoskeletal:        General: Tenderness (multiple tender areas throughout body) present. No swelling, deformity or signs of injury. Normal range of motion.     Cervical back: Normal range of motion and neck  supple.     Right lower leg: No edema.     Left lower leg: No edema.  Lymphadenopathy:     Cervical: No cervical adenopathy.  Skin:    General: Skin is warm and dry.     Coloration: Skin is not jaundiced or pale.     Findings: No bruising, erythema, lesion or rash.  Neurological:     General: No focal deficit present.     Mental Status: She is alert and oriented to person, place, and time.     Cranial Nerves: No cranial nerve deficit.     Sensory: No sensory deficit.     Motor: No weakness.  Coordination: Coordination normal.     Gait: Gait normal.     Deep Tendon Reflexes: Reflexes normal.  Psychiatric:        Mood and Affect: Mood normal.        Behavior: Behavior normal.        Thought Content: Thought content normal.        Judgment: Judgment normal.       Assessment & Plan:  1. Routine general medical examination at a health care facility - Encouraged exercise and heart healthy diet - Follow up in one year or sooner if needed - CBC with Differential/Platelet; Future - Comprehensive metabolic panel; Future - Hemoglobin A1c; Future - Lipid panel; Future - TSH; Future - VITAMIN D 25 Hydroxy (Vit-D Deficiency, Fractures); Future - CBC with Differential/Platelet - Comprehensive metabolic panel - Hemoglobin A1c - Lipid panel - TSH - VITAMIN D 25 Hydroxy (Vit-D Deficiency, Fractures)  2. Essential hypertension  - lisinopril (ZESTRIL) 2.5 MG tablet; Take 1 tablet (2.5 mg total) by mouth daily.  Dispense: 90 tablet; Refill: 3 - CBC with Differential/Platelet; Future - Comprehensive metabolic panel; Future - Hemoglobin A1c; Future - Lipid panel; Future - TSH; Future - VITAMIN D 25 Hydroxy (Vit-D Deficiency, Fractures); Future - CBC with Differential/Platelet - Comprehensive metabolic panel - Hemoglobin A1c - Lipid panel - TSH - VITAMIN D 25 Hydroxy (Vit-D Deficiency, Fractures)  3. Type 2 diabetes mellitus with other specified complication, without long-term  current use of insulin (HCC) - Restart on Glipzide and follow up in 3 months  - glipiZIDE (GLUCOTROL XL) 2.5 MG 24 hr tablet; Take 1 tablet (2.5 mg total) by mouth daily with breakfast.  Dispense: 90 tablet; Refill: 0 - OneTouch Delica Lancets 63O MISC; Check blood sugars twice a day  Dispense: 200 each; Refill: 3 - CBC with Differential/Platelet; Future - Comprehensive metabolic panel; Future - Hemoglobin A1c; Future - Lipid panel; Future - TSH; Future - VITAMIN D 25 Hydroxy (Vit-D Deficiency, Fractures); Future - CBC with Differential/Platelet - Comprehensive metabolic panel - Hemoglobin A1c - Lipid panel - TSH - VITAMIN D 25 Hydroxy (Vit-D Deficiency, Fractures)  4. Fibromyalgia - Increase Lyrica to 150 mg BID - DULoxetine (CYMBALTA) 30 MG capsule; Take 1 capsule (30 mg total) by mouth daily.  Dispense: 90 capsule; Refill: 1 - cyclobenzaprine (FLEXERIL) 10 MG tablet; TAKE 1 TABLET BY MOUTH EVERY 8 HOURS AS NEEDED FOR MUSCLE SPASM  Dispense: 90 tablet; Refill: 1 - CBC with Differential/Platelet; Future - Comprehensive metabolic panel; Future - Hemoglobin A1c; Future - Lipid panel; Future - TSH; Future - VITAMIN D 25 Hydroxy (Vit-D Deficiency, Fractures); Future - CBC with Differential/Platelet - Comprehensive metabolic panel - Hemoglobin A1c - Lipid panel - TSH - VITAMIN D 25 Hydroxy (Vit-D Deficiency, Fractures)  5. Tobacco use - Encouraged to quit smoking  - DG Chest 2 View; Future  6. Need for immunization against influenza - Flu Vaccine QUAD 95moIM (Fluarix, Fluzone & Alfiuria Quad PF)

## 2021-10-31 ENCOUNTER — Other Ambulatory Visit: Payer: Self-pay

## 2021-12-05 ENCOUNTER — Telehealth: Payer: Self-pay

## 2021-12-05 NOTE — Telephone Encounter (Signed)
--  Caller sttes she has been getting dizzy over the last few weeks. Started Cymbalta and lyrica and noticed the dizziness after starting these back. Havent taken the meds for a few days now and feels dizzy off and on  12/04/2021 5:08:17 PM See PCP within 24 Hours Hassell Done, RN, [place]  Referrals REFERRED TO PCP OFFICE  12/05/21 1031 - Pt states intermittent dizziness that last for a few minutes; states has happened maybe 2x/day. Believes it could be medication related b/c it has been going on since prescribed. Pt states she stopped taking Lyrica & Cymbalta. Will note send to PCP for triage.

## 2021-12-06 NOTE — Telephone Encounter (Signed)
Pt notified of update yesterday. No further actions needed!

## 2021-12-20 ENCOUNTER — Encounter: Payer: Self-pay | Admitting: Pharmacist

## 2021-12-20 DIAGNOSIS — Z9189 Other specified personal risk factors, not elsewhere classified: Secondary | ICD-10-CM

## 2021-12-20 NOTE — Progress Notes (Signed)
[facility])                                            [place] Team                                        Statin Quality Measure Assessment    12/20/2021  Karen Hardin 10-15-1965 [id number]    Per review of chart and payor information, patient has a diagnosis of diabetes but is not currently filling a statin prescription.  This places patient into the SUPD (Statin Use In Patients with Diabetes) measure for CMS.    Patient has an upcoming appointment on 12/20/21.  If deemed therapeutically appropriate, patient could be assessed for statin therapy during the upcoming appointment.  If patient has experienced statin intolerance, a statin exclusion code could be associated with the upcoming visit.    The 10-year ASCVD risk score (Arnett DK, et al., 2019) is: 19.6%   Values used to calculate the score:     Age: 56 years     Sex: Female     Is Non-Hispanic African American: Yes     Diabetic: Yes     Tobacco smoker: Yes     Systolic Blood Pressure: 809 mmHg     Is BP treated: Yes     HDL Cholesterol: 43.8 mg/dL     Total Cholesterol: 159 mg/dL 10/27/2021     Component Value Date/Time   CHOL 159 10/27/2021 1346   TRIG 97.0 10/27/2021 1346   HDL 43.80 10/27/2021 1346   CHOLHDL 4 10/27/2021 1346   VLDL 19.4 10/27/2021 1346   LDLCALC 96 10/27/2021 1346   LDLCALC 84 10/16/2019 1328    Please consider ONE of the following recommendations:  Initiate high intensity statin Atorvastatin '40mg'$  once daily, #90, 3 refills   Rosuvastatin '20mg'$  once daily, #90, 3 refills    Initiate moderate intensity          statin with reduced frequency if prior          statin intolerance 1x weekly, #13, 3 refills   2x weekly, #26, 3 refills   3x weekly, #39, 3 refills    Code for past statin intolerance or  other exclusions (required annually)   Provider Requirements:  Associate code during an office visit or telehealth  encounter  Drug Induced Myopathy G72.0   Myopathy, unspecified G72.9   Myositis, unspecified M60.9   Rhabdomyolysis X83.38   Alcoholic fatty liver S50.5   Cirrhosis of liver K74.69   Prediabetes R73.03   PCOS E28.2   Toxic liver disease, unspecified K71.9        Plan:  Send message to PCP prior to upcoming appointment.  Elayne Guerin, PharmD, [place] Clinical Pharmacist [phone]

## 2021-12-21 ENCOUNTER — Encounter: Payer: Self-pay | Admitting: Adult Health

## 2021-12-21 ENCOUNTER — Telehealth (INDEPENDENT_AMBULATORY_CARE_PROVIDER_SITE_OTHER): Payer: PPO | Admitting: Adult Health

## 2021-12-21 VITALS — Ht 67.5 in | Wt 211.0 lb

## 2021-12-21 DIAGNOSIS — M797 Fibromyalgia: Secondary | ICD-10-CM | POA: Diagnosis not present

## 2021-12-21 MED ORDER — DULOXETINE HCL 60 MG PO CPEP: 60.0000 mg | ORAL_CAPSULE | Freq: Every day | ORAL | 0 refills | Status: DC

## 2021-12-21 MED ORDER — VITAMIN D (ERGOCALCIFEROL) 1.25 MG (50000 UNIT) PO CAPS: 50000.0000 [IU] | ORAL_CAPSULE | ORAL | 0 refills | Status: DC

## 2021-12-21 NOTE — Progress Notes (Signed)
Virtual Visit via Video Note  I connected with Karen Hardin on 12/21/21 at  4:15 PM EST by a video enabled telemedicine application and verified that I am speaking with the correct person using two identifiers.  Location patient: home Location provider:work or home office Persons participating in the virtual visit: patient, provider  I discussed the limitations of evaluation and management by telemedicine and the availability of in person appointments. The patient expressed understanding and agreed to proceed.   HPI:  56 year old female who being evaluated today for follow-up regarding fibromyalgia.  She was last seen roughly 6 weeks ago at her CPE at which time we increased her Lyrica 75 mg twice daily 150 mg twice daily.  She was maintained on Cymbalta 30 mg daily and Flexeril 10 mg every 8 hours as needed.  She has had some improvement in her symptoms with Cymbalta.  We increased her Lyrica she became dizzy and fatigued, subsequently to Lyrica and her symptoms resolved.  He has also started using an acupressure mat and has found this helpful.   ROS: See pertinent positives and negatives per HPI.  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   Asthma 2002   uses inhaler as PRN   Depression    as a child, and stress recently   Diabetes mellitus without complication ([place])    on meds   Fibrocystic breast 2005   Fibromyalgia    on meds   GERD (gastroesophageal reflux disease)    hx of   Hypertension    on meds   LGSIL (low grade squamous intraepithelial lesion) on Pap smear 2007   Libido, decreased 2010   Migraines    migraines - otc med prn, then maxalt if needed   Neuromuscular disorder ([place])    Ovarian cyst    Stargardt's disease 1986   Patient is legally blind   Tachycardia    per patient diagnosed in ER 2014, no problems currently   Trichomonas     Vaginosis 2008   Vitamin D deficiency    on meds    Past Surgical History:  Procedure Laterality Date   BREAST EXCISIONAL BIOPSY Left    benign   BREAST SURGERY  1986   biopsy - benign   COLONOSCOPY  2016   JMP-MAC-suprep(good)-SSP   CYSTOSCOPY  12/26/2012   Procedure: CYSTOSCOPY;  Surgeon: Ena Dawley, MD;  Location: [facility];  Service: Gynecology;;   DILATION AND CURETTAGE OF UTERUS     ganglion cyst removed from wrist     left   LAPAROSCOPIC ASSISTED VAGINAL HYSTERECTOMY N/A 12/26/2012   Procedure: LAPAROSCOPIC ASSISTED VAGINAL HYSTERECTOMY Uterine Morcellation, Bilateral Salpingectomy, ;  Surgeon: Ena Dawley, MD;  Location: [facility];  Service: Gynecology;  Laterality: N/A;   MYOMECTOMY     In [place] - laparotomy   OOPHORECTOMY  2000   laparotomy in [place]   SHOULDER ARTHROSCOPY Left 2021   SHOULDER ARTHROSCOPY W/ ROTATOR CUFF REPAIR Right 2006   TONSILLECTOMY AND ADENOIDECTOMY     TUBAL LIGATION  2000   WISDOM TOOTH EXTRACTION     x 2 teeth   WISDOM TOOTH EXTRACTION      Family History  Problem Relation Age of Onset   Arthritis Mother    Diabetes Mother    Hypertension Mother    Pulmonary embolism Mother  Cardiac arrest    Diverticulitis Mother    Heart disease Father    Heart attack Father    Mesothelioma Father    Hyperlipidemia Maternal Grandmother    Breast cancer Maternal Grandmother    Colitis Paternal Aunt        in 73's   Stomach cancer Paternal Aunt    Heart attack Paternal Aunt    Heart attack Paternal Uncle    Stroke Paternal Aunt    Breast cancer Maternal Aunt    Colon polyps Sister 69   Diverticulitis Sister    Colon cancer Neg Hx    Esophageal cancer Neg Hx    Rectal cancer Neg Hx        Current Outpatient Medications:    Ascorbic Acid (VITAMIN C PO), Take 750 mg by mouth daily., Disp: , Rfl:    Blood Glucose Monitoring Suppl ([place]) w/Device KIT, Test once daily. Dx: E11.9, Disp: 1 each, Rfl: 0    Cholecalciferol (VITAMIN D3) 125 MCG (5000 UT) TABS, Take 1 tablet by mouth daily., Disp: , Rfl:    cyclobenzaprine (FLEXERIL) 10 MG tablet, TAKE 1 TABLET BY MOUTH EVERY 8 HOURS AS NEEDED FOR MUSCLE SPASM, Disp: 90 tablet, Rfl: 1   DULoxetine (CYMBALTA) 60 MG capsule, Take 1 capsule (60 mg total) by mouth daily., Disp: 90 capsule, Rfl: 0   Ferrous Sulfate (IRON PO), Take 65 mg by mouth daily., Disp: , Rfl:    glipiZIDE (GLUCOTROL XL) 2.5 MG 24 hr tablet, Take 1 tablet (2.5 mg total) by mouth daily with breakfast., Disp: 90 tablet, Rfl: 0   glucose blood test strip, Use as instructed, Disp: 200 each, Rfl: 3   glucose blood test strip, Use as instructed, Disp: 100 each, Rfl: 12   lisinopril (ZESTRIL) 2.5 MG tablet, Take 1 tablet (2.5 mg total) by mouth daily., Disp: 90 tablet, Rfl: 3   Multiple Vitamin (MULTIVITAMIN ADULT PO), Take 1 tablet by mouth daily., Disp: , Rfl:    OneTouch Delica Lancets 88B MISC, Check blood sugars twice a day, Disp: 200 each, Rfl: 3   OVER THE COUNTER MEDICATION, Apply 1 application  topically as needed. Theragesic Cream.  Applying to neck & back., Disp: , Rfl:    PROAIR HFA 108 (90 Base) MCG/ACT inhaler, Inhale 2 puffs into the lungs every 4 (four) hours as needed for wheezing or shortness of breath., Disp: 18 g, Rfl: 2   SUMAtriptan (IMITREX) 20 MG/ACT nasal spray, Place 1 spray (20 mg total) into the nose every 2 (two) hours as needed for migraine or headache. May repeat in 2 hours if headache persists or recurs.  Maximum 2 sprays in 24 hours, Disp: 6 each, Rfl: 5   topiramate (TOPAMAX) 25 MG tablet, Take 1 tablet at bedtime for one week, then increase to 2 tablets at bedtime, Disp: 60 tablet, Rfl: 0   vitamin B-12 (CYANOCOBALAMIN) 1000 MCG tablet, Take 1,000 mcg by mouth daily., Disp: , Rfl:    Vitamin D, Ergocalciferol, (DRISDOL) 1.25 MG (50000 UNIT) CAPS capsule, Take 1 capsule (50,000 Units total) by mouth every 7 (seven) days., Disp: 12 capsule, Rfl: 0   zinc  gluconate 50 MG tablet, Take 50 mg by mouth daily., Disp: , Rfl:    pregabalin (LYRICA) 150 MG capsule, Take 1 capsule (150 mg total) by mouth 2 (two) times daily. (Patient not taking: Reported on 12/21/2021), Disp: 60 capsule, Rfl: 2   pregabalin (LYRICA) 75 MG capsule, Take 1 capsule (75 mg  total) by mouth 2 (two) times daily. (Patient not taking: Reported on 04/12/2021), Disp: 60 capsule, Rfl: 2  EXAM:  VITALS per patient if applicable:  GENERAL: alert, oriented, appears well and in no acute distress  HEENT: atraumatic, conjunttiva clear, no obvious abnormalities on inspection of external nose and ears  NECK: normal movements of the head and neck  LUNGS: on inspection no signs of respiratory distress, breathing rate appears normal, no obvious gross SOB, gasping or wheezing  CV: no obvious cyanosis  MS: moves all visible extremities without noticeable abnormality  PSYCH/NEURO: pleasant and cooperative, no obvious depression or anxiety, speech and thought processing grossly intact  ASSESSMENT AND PLAN:  Discussed the following assessment and plan:  1. Fibromyalgia -I will increase her Cymbalta to 60 mg daily to see if she gets any more benefit from this medication.  She will follow-up in 1 month.  At that time we can try adding low-dose Lyrica to see if she can get any benefit.  Continue to use acupressure mat and encouraged exercise to help with fibromyalgia symptoms - DULoxetine (CYMBALTA) 60 MG capsule; Take 1 capsule (60 mg total) by mouth daily.  Dispense: 90 capsule; Refill: 0      I discussed the assessment and treatment plan with the patient. The patient was provided an opportunity to ask questions and all were answered. The patient agreed with the plan and demonstrated an understanding of the instructions.   The patient was advised to call back or seek an in-person evaluation if the symptoms worsen or if the condition fails to improve as anticipated.   [name], NP

## 2022-01-23 ENCOUNTER — Other Ambulatory Visit: Payer: Self-pay | Admitting: Adult Health

## 2022-01-23 DIAGNOSIS — E1169 Type 2 diabetes mellitus with other specified complication: Secondary | ICD-10-CM

## 2022-02-21 ENCOUNTER — Other Ambulatory Visit: Payer: Self-pay | Admitting: Adult Health

## 2022-02-21 DIAGNOSIS — Z1231 Encounter for screening mammogram for malignant neoplasm of breast: Secondary | ICD-10-CM

## 2022-02-22 ENCOUNTER — Telehealth: Payer: PPO | Admitting: Adult Health

## 2022-02-24 ENCOUNTER — Other Ambulatory Visit: Payer: Self-pay | Admitting: Adult Health

## 2022-03-16 ENCOUNTER — Ambulatory Visit: Payer: PPO | Admitting: Adult Health

## 2022-03-16 ENCOUNTER — Encounter: Payer: Self-pay | Admitting: Adult Health

## 2022-03-16 ENCOUNTER — Ambulatory Visit (INDEPENDENT_AMBULATORY_CARE_PROVIDER_SITE_OTHER): Payer: Medicare HMO | Admitting: Adult Health

## 2022-03-16 VITALS — BP 120/80 | HR 80 | Temp 98.4°F | Ht 67.5 in | Wt 217.0 lb

## 2022-03-16 DIAGNOSIS — M797 Fibromyalgia: Secondary | ICD-10-CM

## 2022-03-16 DIAGNOSIS — E559 Vitamin D deficiency, unspecified: Secondary | ICD-10-CM

## 2022-03-16 DIAGNOSIS — I10 Essential (primary) hypertension: Secondary | ICD-10-CM

## 2022-03-16 DIAGNOSIS — E1169 Type 2 diabetes mellitus with other specified complication: Secondary | ICD-10-CM | POA: Diagnosis not present

## 2022-03-16 DIAGNOSIS — M545 Low back pain, unspecified: Secondary | ICD-10-CM | POA: Diagnosis not present

## 2022-03-16 DIAGNOSIS — M25552 Pain in left hip: Secondary | ICD-10-CM | POA: Diagnosis not present

## 2022-03-16 MED ORDER — PREGABALIN 25 MG PO CAPS: 25.0000 mg | ORAL_CAPSULE | Freq: Every day | ORAL | 0 refills | Status: DC

## 2022-03-16 NOTE — Progress Notes (Signed)
Subjective:    Patient ID: Karen Hardin, female    DOB: 1966/02/03, 57 y.o.   MRN: [id number]  HPI  57 year old female who  has a past medical history of Allergy, Anemia, Arthritis, Asthma (2002), Depression, Diabetes mellitus without complication ([place]), Fibrocystic breast (2005), Fibromyalgia, GERD (gastroesophageal reflux disease), Hypertension, LGSIL (low grade squamous intraepithelial lesion) on Pap smear (2007), Libido, decreased (2010), Migraines, Neuromuscular disorder ([place]), Ovarian cyst, Stargardt's disease (1986), Tachycardia, Trichomonas, Vaginosis (2008), and Vitamin D deficiency.  She presents to the office today for three month follow up regarding DM/HTN/Vitamin D deficiency  DM Type 2 -managed with glipizide 2.5 mg extended release daily.  During her last visit her A1c had increased from 6.4-6.8.  She has been without medication for Lab Results  Component Value Date   HGBA1C 6.8 (H) 10/27/2021   HTN -managed with lisinopril 2.5 mg daily. BP Readings from Last 3 Encounters:  10/27/21 120/70  04/27/21 130/86  04/12/21 135/83   Vitamin D deficiency - was started on Vitamin D 50,000 units daily in September when her Vitamin D level was low.  Last vitamin D Lab Results  Component Value Date   VD25OH 20.20 (L) 10/27/2021    Fibromyalgia - In November  2023 we increased her Cymbalta to 60 mg. At this time she had Lyrica 150 mg but this was making her feel fatigued and dizzy so she stopped it. Today she reports that that somedays the pain is better than the others, when she was in a lot of pain last week she tried to take a lyrica and  it caused the same symptoms.  She has been working out intermittently and noticed that when she does workout she feels much better overall.  Wt Readings from Last 3 Encounters:  03/16/22 217 lb (98.4 kg)  12/21/21 211 lb (95.7 kg)  10/27/21 211 lb (95.7 kg)   Low back and left hip pain -he reports that she recently started having  worsening low back and left hip pain that feels different than her fibromyalgia pain.  Pain is worse with certain positions as well as walking.  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   Asthma 2002   uses inhaler as PRN   Depression    as a child, and stress recently   Diabetes mellitus without complication ([place])    on meds   Fibrocystic breast 2005   Fibromyalgia    on meds   GERD (gastroesophageal reflux disease)    hx of   Hypertension    on meds   LGSIL (low grade squamous intraepithelial lesion) on Pap smear 2007   Libido, decreased 2010   Migraines    migraines - otc med prn, then maxalt if needed   Neuromuscular disorder ([place])    Ovarian cyst    Stargardt's disease 1986   Patient is legally blind   Tachycardia    per patient diagnosed in ER 2014, no problems currently   Trichomonas    Vaginosis 2008   Vitamin D deficiency    on meds    Social History   Socioeconomic History   Marital status: Married    Spouse name: Not on file   Number of children: 3  Years of education: Not on file   Highest education level: Some college, no degree  Occupational History   Not on file  Tobacco Use   Smoking status: Some Days    Packs/day: 0.10    Years: 20.00    Total pack years: 2.00    Types: Cigarettes    Start date: 02/28/2015   Smokeless tobacco: Never  Vaping Use   Vaping Use: Never used  Substance and Sexual Activity   Alcohol use: No    Alcohol/week: 0.0 standard drinks of alcohol   Drug use: No   Sexual activity: Yes    Birth control/protection: Surgical, Condom  Other Topics Concern   Not on file  Social History Narrative   Is on disability    Married for 21 years    Three boys ( all three live locally)       She likes to hang out with friends. 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 Right handed   Drinks caffeine   One story home   Social Determinants of Health   Financial Resource Strain: Low Risk  (10/02/2021)   Overall Financial Resource Strain (CARDIA)    Difficulty of Paying Living Expenses: Not hard at all  Food Insecurity: No Food Insecurity (10/02/2021)   Hunger Vital Sign    Worried About Running Out of Food in the Last Year: Never true    Ran Out of Food in the Last Year: Never true  Transportation Needs: No Transportation Needs (10/02/2021)   PRAPARE - Hydrologist (Medical): No    Lack of Transportation (Non-Medical): No  Physical Activity: Inactive (10/02/2021)   Exercise Vital Sign    Days of Exercise per Week: 0 days    Minutes of Exercise per Session: 0 min  Stress: Stress Concern Present (10/02/2021)   [place]    Feeling of Stress : To some extent  Social Connections: Not on file  Intimate Partner Violence: Not on file    Past Surgical History:  Procedure Laterality Date   BREAST EXCISIONAL BIOPSY Left    benign   BREAST SURGERY  1986   biopsy - benign   COLONOSCOPY  2016   JMP-MAC-suprep(good)-SSP   CYSTOSCOPY  12/26/2012   Procedure: CYSTOSCOPY;  Surgeon: Ena Dawley, MD;  Location: [facility];  Service: Gynecology;;   DILATION AND CURETTAGE OF UTERUS     ganglion cyst removed from wrist     left   LAPAROSCOPIC ASSISTED VAGINAL HYSTERECTOMY N/A 12/26/2012   Procedure: LAPAROSCOPIC ASSISTED VAGINAL HYSTERECTOMY Uterine Morcellation, Bilateral Salpingectomy, ;  Surgeon: Ena Dawley, MD;  Location: [facility];  Service: Gynecology;  Laterality: N/A;   MYOMECTOMY     In [place] - laparotomy   OOPHORECTOMY  2000   laparotomy in [place]   SHOULDER ARTHROSCOPY Left 2021   SHOULDER ARTHROSCOPY W/ ROTATOR CUFF REPAIR Right 2006   TONSILLECTOMY AND ADENOIDECTOMY     TUBAL  LIGATION  2000   WISDOM TOOTH EXTRACTION     x 2 teeth   WISDOM TOOTH EXTRACTION      Family History  Problem Relation Age of Onset   Arthritis Mother    Diabetes Mother    Hypertension Mother    Pulmonary embolism Mother        Cardiac arrest    Diverticulitis Mother    Heart disease Father    Heart attack Father  Mesothelioma Father    Hyperlipidemia Maternal Grandmother    Breast cancer Maternal Grandmother    Colitis Paternal Aunt        in 74's   Stomach cancer Paternal Aunt    Heart attack Paternal Aunt    Heart attack Paternal Uncle    Stroke Paternal Aunt    Breast cancer Maternal Aunt    Colon polyps Sister 20   Diverticulitis Sister    Colon cancer Neg Hx    Esophageal cancer Neg Hx    Rectal cancer Neg Hx     Allergies  Allergen Reactions   Cholestatin     Current Outpatient Medications on File Prior to Visit  Medication Sig Dispense Refill   albuterol (VENTOLIN HFA) 108 (90 Base) MCG/ACT inhaler INHALE 2 PUFFS BY MOUTH EVERY 4 HOURS AS NEEDED FOR WHEEZING FOR SHORTNESS OF BREATH 18 g 0   Ascorbic Acid (VITAMIN C PO) Take 750 mg by mouth daily.     Blood Glucose Monitoring Suppl ([place]) w/Device KIT Test once daily. Dx: E11.9 1 each 0   Cholecalciferol (VITAMIN D3) 125 MCG (5000 UT) TABS Take 1 tablet by mouth daily.     cyclobenzaprine (FLEXERIL) 10 MG tablet TAKE 1 TABLET BY MOUTH EVERY 8 HOURS AS NEEDED FOR MUSCLE SPASM 90 tablet 1   DULoxetine (CYMBALTA) 60 MG capsule Take 1 capsule (60 mg total) by mouth daily. 90 capsule 0   Ferrous Sulfate (IRON PO) Take 65 mg by mouth daily.     glipiZIDE (GLUCOTROL XL) 2.5 MG 24 hr tablet Take 1 tablet by mouth once daily with breakfast 90 tablet 0   glucose blood test strip Use as instructed 200 each 3   glucose blood test strip Use as instructed 100 each 12   lisinopril (ZESTRIL) 2.5 MG tablet Take 1 tablet (2.5 mg total) by mouth daily. 90 tablet 3   Multiple Vitamin (MULTIVITAMIN ADULT PO)  Take 1 tablet by mouth daily.     OneTouch Delica Lancets 50D MISC Check blood sugars twice a day 200 each 3   OVER THE COUNTER MEDICATION Apply 1 application  topically as needed. Theragesic Cream.  Applying to neck & back.     SUMAtriptan (IMITREX) 20 MG/ACT nasal spray Place 1 spray (20 mg total) into the nose every 2 (two) hours as needed for migraine or headache. May repeat in 2 hours if headache persists or recurs.  Maximum 2 sprays in 24 hours 6 each 5   topiramate (TOPAMAX) 25 MG tablet Take 1 tablet at bedtime for one week, then increase to 2 tablets at bedtime 60 tablet 0   vitamin B-12 (CYANOCOBALAMIN) 1000 MCG tablet Take 1,000 mcg by mouth daily.     Vitamin D, Ergocalciferol, (DRISDOL) 1.25 MG (50000 UNIT) CAPS capsule Take 1 capsule (50,000 Units total) by mouth every 7 (seven) days. 12 capsule 0   zinc gluconate 50 MG tablet Take 50 mg by mouth daily.     No current facility-administered medications on file prior to visit.    LMP 12/14/2012       Objective:   Physical Exam Vitals and nursing note reviewed.  Constitutional:      Appearance: Normal appearance.  Cardiovascular:     Rate and Rhythm: Normal rate and regular rhythm.     Pulses: Normal pulses.     Heart sounds: Normal heart sounds.  Musculoskeletal:        General: Tenderness present.     Lumbar  back: Bony tenderness present. Normal range of motion.     Left hip: Tenderness and bony tenderness present. No crepitus. Normal range of motion.     Comments: For in the left hip with to chest and external rotation of the knee.  Skin:    General: Skin is warm and dry.  Neurological:     General: No focal deficit present.     Mental Status: She is alert and oriented to person, place, and time.  Psychiatric:        Mood and Affect: Mood normal.        Behavior: Behavior normal.        Thought Content: Thought content normal.           Assessment & Plan:  1. Type 2 diabetes mellitus with other specified  complication, without long-term current use of insulin (HCC) - Consider increase in statin  - Hemoglobin A1c; Future - Microalbumin/Creatinine Ratio, Urine; Future - Microalbumin/Creatinine Ratio, Urine - Hemoglobin A1c  2. Essential hypertension - Well controlled - No change in therapy  - Microalbumin/Creatinine Ratio, Urine; Future - Microalbumin/Creatinine Ratio, Urine  3. Vitamin D deficiency  - VITAMIN D 25 Hydroxy (Vit-D Deficiency, Fractures); Future - VITAMIN D 25 Hydroxy (Vit-D Deficiency, Fractures)  4. Fibromyalgia - Will try using a low dose lyrica.  Advised to start taking it once a day and if tolerating well can titrate to twice daily.  She will send me a MyChart message.  She was encouraged to workout on a regular basis to help with her fibromyalgia pain - pregabalin (LYRICA) 25 MG capsule; Take 1 capsule (25 mg total) by mouth daily.  Dispense: 30 capsule; Refill: 0  5. Acute midline low back pain without sciatica  - DG Lumbar Spine Complete; Future  6. Left hip pain  - DG Hip Unilat W OR W/O Pelvis 2-3 Views Left; Future  [name], NP

## 2022-03-16 NOTE — Patient Instructions (Signed)
I have placed two xray orders for Karen Hardin   I have sent in Karen Hardin 25 mg daily - start taking this once a day and work up to 2 times a day   We will follow up with you regarding your blood work

## 2022-03-17 LAB — HEMOGLOBIN A1C
Hgb A1c MFr Bld: 6.6 % of total Hgb — ABNORMAL HIGH (ref <5.7)
Mean Plasma Glucose: 143 mg/dL
eAG (mmol/L): 7.9 mmol/L

## 2022-03-17 LAB — MICROALBUMIN / CREATININE URINE RATIO
Creatinine, Urine: 161 mg/dL (ref 20–275)
Microalb Creat Ratio: 6 mcg/mg creat (ref <30)
Microalb, Ur: 1 mg/dL

## 2022-03-17 LAB — VITAMIN D 25 HYDROXY (VIT D DEFICIENCY, FRACTURES): Vit D, 25-Hydroxy: 62 ng/mL (ref 30–100)

## 2022-03-20 ENCOUNTER — Other Ambulatory Visit: Payer: Self-pay | Admitting: Adult Health

## 2022-03-22 ENCOUNTER — Ambulatory Visit (INDEPENDENT_AMBULATORY_CARE_PROVIDER_SITE_OTHER)
Admission: RE | Admit: 2022-03-22 | Discharge: 2022-03-22 | Disposition: A | Discharge status: Home / Self Care | Payer: Medicare HMO | Source: Ambulatory Visit | Attending: Adult Health | Admitting: Adult Health

## 2022-03-22 DIAGNOSIS — M545 Low back pain, unspecified: Secondary | ICD-10-CM | POA: Diagnosis not present

## 2022-03-22 DIAGNOSIS — M76892 Other specified enthesopathies of left lower limb, excluding foot: Secondary | ICD-10-CM | POA: Diagnosis not present

## 2022-03-22 DIAGNOSIS — M25552 Pain in left hip: Secondary | ICD-10-CM | POA: Diagnosis not present

## 2022-03-22 DIAGNOSIS — M1612 Unilateral primary osteoarthritis, left hip: Secondary | ICD-10-CM | POA: Diagnosis not present

## 2022-03-28 ENCOUNTER — Other Ambulatory Visit: Payer: Self-pay

## 2022-03-28 DIAGNOSIS — M1990 Unspecified osteoarthritis, unspecified site: Secondary | ICD-10-CM

## 2022-03-30 ENCOUNTER — Telehealth: Payer: Self-pay | Admitting: Adult Health

## 2022-03-30 ENCOUNTER — Ambulatory Visit (INDEPENDENT_AMBULATORY_CARE_PROVIDER_SITE_OTHER): Payer: Medicare HMO | Admitting: Family Medicine

## 2022-03-30 ENCOUNTER — Encounter: Payer: Self-pay | Admitting: Family Medicine

## 2022-03-30 VITALS — BP 138/80 | HR 97 | Temp 98.3°F | Ht 67.5 in | Wt 217.0 lb

## 2022-03-30 DIAGNOSIS — J019 Acute sinusitis, unspecified: Secondary | ICD-10-CM

## 2022-03-30 MED ORDER — AZITHROMYCIN 250 MG PO TABS: ORAL_TABLET | ORAL | 0 refills | Status: DC

## 2022-03-30 NOTE — Telephone Encounter (Signed)
Pharmacy called to verify the correct dosage / instructions for the azithromycin (ZITHROMAX Z-PAK) 250 MG tablet   They are requesting a call back asap!!  [place] ([place]), [place] - [address] Phone: [phone]  Fax: [phone]

## 2022-03-30 NOTE — Telephone Encounter (Signed)
Rx sent to pharmacy   

## 2022-03-30 NOTE — Progress Notes (Signed)
   Subjective:    Patient ID: Karen Hardin, female    DOB: [birth year], 57 y.o.   MRN: [id number]  HPI Here for 3 days of sinus pressure, HA, PND, ST , and a dry cough. No fever. Taing Mucinex and Dayquil.    Review of Systems  Constitutional: Negative.   HENT:  Positive for congestion, postnasal drip, sinus pressure and sore throat. Negative for ear pain.   Eyes: Negative.   Respiratory:  Positive for cough. Negative for shortness of breath and wheezing.        Objective:   Physical Exam Constitutional:      Appearance: Normal appearance.  HENT:     Right Ear: Tympanic membrane, ear canal and external ear normal.     Left Ear: Tympanic membrane, ear canal and external ear normal.     Nose: Nose normal.     Mouth/Throat:     Pharynx: Oropharynx is clear.  Eyes:     Conjunctiva/sclera: Conjunctivae normal.  Pulmonary:     Effort: Pulmonary effort is normal.     Breath sounds: Normal breath sounds.  Neurological:     Mental Status: She is alert.           Assessment & Plan:  Sinusitis, treat with a Zpack. Alysia Penna, MD

## 2022-04-16 ENCOUNTER — Ambulatory Visit
Admission: RE | Admit: 2022-04-16 | Discharge: 2022-04-16 | Disposition: A | Discharge status: Home / Self Care | Payer: Medicare HMO | Source: Ambulatory Visit | Attending: Adult Health | Admitting: Adult Health

## 2022-04-16 DIAGNOSIS — Z1231 Encounter for screening mammogram for malignant neoplasm of breast: Secondary | ICD-10-CM | POA: Diagnosis not present

## 2022-05-18 ENCOUNTER — Telehealth: Payer: Self-pay

## 2022-05-21 ENCOUNTER — Telehealth: Payer: Self-pay

## 2022-05-21 NOTE — Progress Notes (Signed)
   05/21/2022  Patient ID: Karen Hardin, female   DOB: [birth year], 57 y.o.   MRN: [id number]  Patient outreach attempt to discuss medication management.  Patient appeared on a quality report identify failed measures per insurance in 2023 in regard to adherence to oral diabetes and hypertension medications as well as statin use in patients with diabetes.  Left message with my direct call back number to reach me to review medications at her convenience.  Lenna Gilford, PharmD, DPLA

## 2022-05-29 NOTE — Progress Notes (Signed)
   05/29/2022  Patient ID: Karen Hardin, female   DOB: 1965-06-12, 57 y.o.   MRN: [id number]  Second patient outreach attempt to discuss medication adherence; left message with my direct number for patient to call at her convenience.  Lenna Gilford, PharmD, DPLA

## 2022-06-09 ENCOUNTER — Other Ambulatory Visit: Payer: Self-pay | Admitting: Adult Health

## 2022-07-20 DIAGNOSIS — E119 Type 2 diabetes mellitus without complications: Secondary | ICD-10-CM | POA: Diagnosis not present

## 2022-07-20 DIAGNOSIS — H31003 Unspecified chorioretinal scars, bilateral: Secondary | ICD-10-CM | POA: Diagnosis not present

## 2022-07-20 DIAGNOSIS — H5213 Myopia, bilateral: Secondary | ICD-10-CM | POA: Diagnosis not present

## 2022-07-20 LAB — HM DIABETES EYE EXAM

## 2022-07-23 ENCOUNTER — Encounter: Payer: Self-pay | Admitting: Adult Health

## 2022-09-05 ENCOUNTER — Encounter (HOSPITAL_COMMUNITY): Payer: Self-pay | Admitting: Emergency Medicine

## 2022-09-05 ENCOUNTER — Ambulatory Visit (HOSPITAL_COMMUNITY)
Admission: EM | Admit: 2022-09-05 | Discharge: 2022-09-05 | Disposition: A | Discharge status: Home / Self Care | Payer: Medicare HMO | Attending: Family Medicine | Admitting: Family Medicine

## 2022-09-05 ENCOUNTER — Ambulatory Visit: Payer: Self-pay

## 2022-09-05 DIAGNOSIS — G8929 Other chronic pain: Secondary | ICD-10-CM

## 2022-09-05 DIAGNOSIS — M5442 Lumbago with sciatica, left side: Secondary | ICD-10-CM

## 2022-09-05 MED ORDER — PREDNISONE 20 MG PO TABS: ORAL_TABLET | ORAL | 0 refills | Status: AC

## 2022-09-05 MED ORDER — DEXAMETHASONE SODIUM PHOSPHATE 10 MG/ML IJ SOLN
10.0000 mg | Freq: Once | INTRAMUSCULAR | Status: AC
  Administered 2022-09-05: 10 mg via INTRAMUSCULAR

## 2022-09-05 MED ORDER — DEXAMETHASONE SODIUM PHOSPHATE 10 MG/ML IJ SOLN
INTRAMUSCULAR | Status: AC
  Filled 2022-09-05: qty 1

## 2022-09-05 MED ORDER — CYCLOBENZAPRINE HCL 10 MG PO TABS: 10.0000 mg | ORAL_TABLET | Freq: Two times a day (BID) | ORAL | 0 refills | Status: DC | PRN

## 2022-09-05 NOTE — ED Triage Notes (Signed)
Pt c/o lower back pain that has been on going. Pt reports that she takes IBU 800 mg for pain.  Pt states that she was supposed to start therapy a while back but never did. Denies any new injury or lifting. Denies bowel or urinary problems.

## 2022-09-05 NOTE — Discharge Instructions (Signed)
As discussed closely monitor your blood sugar while taking prednisone as it can cause some elevations in blood sugar.  Try and eat more vegetables and fruits and void starchy foods while taking the prednisone as this can tribute to increases in blood glucose.  Take medication as prescribed.  Would recommend reconsidering physical therapy as it has been shown to improve recurrent see of back pain.

## 2022-09-05 NOTE — ED Provider Notes (Signed)
[facility]    CSN: [id number] Arrival date & time: 09/05/22  1242      History   Chief Complaint Chief Complaint  Patient presents with   Back Pain    HPI Karen Hardin is a 57 y.o. female.   HPI Patient presents today for evaluation of low back pain and that is radiating into the left hip.  Pain has been recurrent for a long period of time.  She reports upon awakening this morning her back pain was significant enough for her to come in to be evaluated.  It is constantly radiating down to her left hip.  Patient has discussed this with her primary care doctor who had recommended physical therapy however  she declined to go to therapy back in the spring.  She reports this has been a chronic and recurrent problem.  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   Asthma 2002   uses inhaler as PRN   Depression    as a child, and stress recently   Diabetes mellitus without complication (HCC)    on meds   Fibrocystic breast 2005   Fibromyalgia    on meds   GERD (gastroesophageal reflux disease)    hx of   Hypertension    on meds   LGSIL (low grade squamous intraepithelial lesion) on Pap smear 2007   Libido, decreased 2010   Migraines    migraines - otc med prn, then maxalt if needed   Neuromuscular disorder (HCC)    Ovarian cyst    Stargardt's disease 1986   Patient is legally blind   Tachycardia    per patient diagnosed in ER 2014, no problems currently   Trichomonas    Vaginosis 2008   Vitamin D deficiency    on meds    Patient Active Problem List   Diagnosis Date Noted   Vitamin D deficiency 08/06/2018   Vitamin B 12 deficiency 08/06/2018   Iron deficiency anemia 08/06/2018   Diabetes type 2, uncontrolled 09/22/2015   Asthma, chronic 05/11/2014   Legal blindness 05/11/2014   Chronic back pain 05/11/2014   Migraines 05/11/2014    Stargardt's disease    Menorrhagia 09/11/2011    Past Surgical History:  Procedure Laterality Date   BREAST EXCISIONAL BIOPSY Left    benign   BREAST SURGERY  1986   biopsy - benign   COLONOSCOPY  2016   JMP-MAC-suprep(good)-SSP   CYSTOSCOPY  12/26/2012   Procedure: CYSTOSCOPY;  Surgeon: Kirkland Hun, MD;  Location: [facility];  Service: Gynecology;;   DILATION AND CURETTAGE OF UTERUS     ganglion cyst removed from wrist     left   LAPAROSCOPIC ASSISTED VAGINAL HYSTERECTOMY N/A 12/26/2012   Procedure: LAPAROSCOPIC ASSISTED VAGINAL HYSTERECTOMY Uterine Morcellation, Bilateral Salpingectomy, ;  Surgeon: Kirkland Hun, MD;  Location: [facility];  Service: Gynecology;  Laterality: N/A;   MYOMECTOMY     In [place] - laparotomy   OOPHORECTOMY  2000   laparotomy in [place]   SHOULDER ARTHROSCOPY Left 2021   SHOULDER ARTHROSCOPY W/ ROTATOR CUFF REPAIR Right 2006   TONSILLECTOMY AND ADENOIDECTOMY     TUBAL LIGATION  2000   WISDOM TOOTH EXTRACTION     x 2 teeth   WISDOM TOOTH EXTRACTION      OB History  Gravida  3   Para  3   Term  3   Preterm      AB      Living  3      SAB      IAB      Ectopic      Multiple      Live Births  3            Home Medications    Prior to Admission medications   Medication Sig Start Date End Date Taking? Authorizing Provider  cyclobenzaprine (FLEXERIL) 10 MG tablet Take 1 tablet (10 mg total) by mouth 2 (two) times daily as needed for muscle spasms. 09/05/22  Yes Bing Neighbors, NP  predniSONE (DELTASONE) 20 MG tablet Take 2 tablets (40 mg total) by mouth daily with breakfast for 2 days, THEN 1 tablet (20 mg total) daily with breakfast for 3 days. 09/05/22 09/10/22 Yes Bing Neighbors, NP  albuterol (VENTOLIN HFA) 108 (90 Base) MCG/ACT inhaler INHALE 2 PUFFS BY MOUTH EVERY 4 HOURS AS NEEDED FOR WHEEZING FOR SHORTNESS OF BREATH 06/12/22   Nafziger, Kandee Keen, NP  Ascorbic Acid (VITAMIN C PO) Take 750 mg by mouth daily.     [provider]  azithromycin (ZITHROMAX Z-PAK) 250 MG tablet As directed 03/30/22   Nelwyn Salisbury, MD  Blood Glucose Monitoring Suppl (ONE [facility] BASIC SYSTEM) w/Device KIT Test once daily. Dx: E11.9 01/27/16   Shirline Frees, NP  Cholecalciferol (VITAMIN D3) 125 MCG (5000 UT) TABS Take 1 tablet by mouth daily.    [provider]  DULoxetine (CYMBALTA) 60 MG capsule Take 1 capsule (60 mg total) by mouth daily. 12/21/21   Nafziger, Kandee Keen, NP  Ferrous Sulfate (IRON PO) Take 65 mg by mouth daily.    [provider]  glipiZIDE (GLUCOTROL XL) 2.5 MG 24 hr tablet Take 1 tablet by mouth once daily with breakfast 01/23/22   Nafziger, Kandee Keen, NP  glucose blood test strip Use as instructed 04/23/19   Shirline Frees, NP  glucose blood test strip Use as instructed 10/27/21   Nafziger, Kandee Keen, NP  lisinopril (ZESTRIL) 2.5 MG tablet Take 1 tablet (2.5 mg total) by mouth daily. 10/27/21   Nafziger, Kandee Keen, NP  Multiple Vitamin (MULTIVITAMIN ADULT PO) Take 1 tablet by mouth daily.    [provider]  OneTouch Delica Lancets 33G MISC Check blood sugars twice a day 10/27/21   Shirline Frees, NP  OVER THE COUNTER MEDICATION Apply 1 application  topically as needed. Theragesic Cream.  Applying to neck & back.    [provider]  pregabalin (LYRICA) 25 MG capsule Take 1 capsule (25 mg total) by mouth daily. 03/16/22   Nafziger, Kandee Keen, NP  SUMAtriptan (IMITREX) 20 MG/ACT nasal spray Place 1 spray (20 mg total) into the nose every 2 (two) hours as needed for migraine or headache. May repeat in 2 hours if headache persists or recurs.  Maximum 2 sprays in 24 hours 04/12/21   Drema Dallas, DO  topiramate (TOPAMAX) 25 MG tablet Take 1 tablet at bedtime for one week, then increase to 2 tablets at bedtime 04/12/21   Drema Dallas, DO  vitamin B-12 (CYANOCOBALAMIN) 1000 MCG tablet Take 1,000 mcg by mouth daily.    [provider]  zinc gluconate 50 MG tablet Take 50 mg by mouth daily.     [provider]    Family History Family History  Problem Relation Age of Onset   Arthritis Mother  Diabetes Mother    Hypertension Mother    Pulmonary embolism Mother        Cardiac arrest    Diverticulitis Mother    Heart disease Father    Heart attack Father    Mesothelioma Father    Hyperlipidemia Maternal Grandmother    Breast cancer Maternal Grandmother    Colitis Paternal Aunt        in 76's   Stomach cancer Paternal Aunt    Heart attack Paternal Aunt    Heart attack Paternal Uncle    Stroke Paternal Aunt    Breast cancer Maternal Aunt    Colon polyps Sister 34   Diverticulitis Sister    Colon cancer Neg Hx    Esophageal cancer Neg Hx    Rectal cancer Neg Hx     Social History Social History   Tobacco Use   Smoking status: Some Days    Current packs/day: 0.10    Average packs/day: 0.1 packs/day for 20.0 years (2.0 ttl pk-yrs)    Types: Cigarettes    Start date: 02/28/2015   Smokeless tobacco: Never  Vaping Use   Vaping status: Never Used  Substance Use Topics   Alcohol use: No    Alcohol/week: 0.0 standard drinks of alcohol   Drug use: No     Allergies   Cholestatin   Review of Systems Review of Systems Pertinent negatives listed in HPI   Physical Exam Triage Vital Signs ED Triage Vitals [09/05/22 1315]  Encounter Vitals Group     BP 128/82     Systolic BP Percentile      Diastolic BP Percentile      Pulse Rate 88     Resp 17     Temp 97.9 F (36.6 C)     Temp Source Oral     SpO2 98 %     Weight      Height      Head Circumference      Peak Flow      Pain Score 8     Pain Loc      Pain Education      Exclude from Growth Chart    No data found.  Updated Vital Signs BP 128/82 (BP Location: Left Arm)   Pulse 88   Temp 97.9 F (36.6 C) (Oral)   Resp 17   LMP 12/14/2012   SpO2 98%   Visual Acuity Right Eye Distance:   Left Eye Distance:   Bilateral Distance:    Right Eye Near:   Left Eye Near:    Bilateral  Near:     Physical Exam Vitals reviewed.  Constitutional:      Appearance: Normal appearance.  HENT:     Head: Normocephalic and atraumatic.  Eyes:     Extraocular Movements: Extraocular movements intact.     Pupils: Pupils are equal, round, and reactive to light.  Cardiovascular:     Rate and Rhythm: Normal rate and regular rhythm.  Pulmonary:     Effort: Pulmonary effort is normal.     Breath sounds: Normal breath sounds.  Musculoskeletal:     Cervical back: Normal range of motion and neck supple.     Lumbar back: Spasms and tenderness present. Normal range of motion. Negative right straight leg raise test and negative left straight leg raise test.  Skin:    Capillary Refill: Capillary refill takes less than 2 seconds.  Neurological:     General: No focal deficit present.  Mental Status: She is alert.      UC Treatments / Results  Labs (all labs ordered are listed, but only abnormal results are displayed) Labs Reviewed - No data to display  EKG   Radiology No results found.  Procedures Procedures (including critical care time)  Medications Ordered in UC Medications  dexamethasone (DECADRON) injection 10 mg (10 mg Intramuscular Given 09/05/22 1357)    Initial Impression / Assessment and Plan / UC Course  I have reviewed the triage vital signs and the nursing notes.  Pertinent labs & imaging results that were available during my care of the patient were reviewed by me and considered in my medical decision making (see chart for details). Chronic left-sided low back pain with sciatica, treatment per discharge medication orders.  Patient also given a Decadron injection while here in clinic.  Patient advised not to start prednisone until tomorrow.  Manage pain with cyclobenzaprine twice daily. Encouraged to go to physical therapy.  Final Clinical Impressions(s) / UC Diagnoses   Final diagnoses:  Chronic left-sided low back pain with left-sided sciatica      Discharge Instructions      As discussed closely monitor your blood sugar while taking prednisone as it can cause some elevations in blood sugar.  Try and eat more vegetables and fruits and void starchy foods while taking the prednisone as this can tribute to increases in blood glucose.  Take medication as prescribed.  Would recommend reconsidering physical therapy as it has been shown to improve recurrent see of back pain.     ED Prescriptions     Medication Sig Dispense Auth. Provider   cyclobenzaprine (FLEXERIL) 10 MG tablet Take 1 tablet (10 mg total) by mouth 2 (two) times daily as needed for muscle spasms. 20 tablet Bing Neighbors, NP   predniSONE (DELTASONE) 20 MG tablet Take 2 tablets (40 mg total) by mouth daily with breakfast for 2 days, THEN 1 tablet (20 mg total) daily with breakfast for 3 days. 7 tablet Bing Neighbors, NP      PDMP not reviewed this encounter.   Bing Neighbors, NP 09/05/22 [id number]

## 2022-09-19 ENCOUNTER — Ambulatory Visit (INDEPENDENT_AMBULATORY_CARE_PROVIDER_SITE_OTHER): Payer: Medicare HMO

## 2022-09-19 VITALS — Ht 67.0 in | Wt 217.0 lb

## 2022-09-19 DIAGNOSIS — Z0000 Encounter for general adult medical examination without abnormal findings: Secondary | ICD-10-CM | POA: Diagnosis not present

## 2022-09-19 NOTE — Patient Instructions (Addendum)
Karen Hardin , Thank you for taking time to come for your Medicare Wellness Visit. I appreciate your ongoing commitment to your health goals. Please review the following plan we discussed and let me know if I can assist you in the future.   Referrals/Orders/Follow-Ups/Clinician Recommendations:   This is a list of the screening recommended for you and due dates:  Health Maintenance  Topic Date Due   Hepatitis C Screening  Never done   Zoster (Shingles) Vaccine (1 of 2) Never done   COVID-19 Vaccine (3 - 2023-24 season) 10/13/2021   Flu Shot  09/13/2022   Hemoglobin A1C  09/14/2022   HIV Screening  05/10/2024*   Yearly kidney function blood test for diabetes  10/28/2022   Complete foot exam   10/28/2022   Yearly kidney health urinalysis for diabetes  03/17/2023   Eye exam for diabetics  07/20/2023   Medicare Annual Wellness Visit  09/19/2023   Mammogram  04/15/2024   Colon Cancer Screening  07/20/2025   DTaP/Tdap/Td vaccine (2 - Td or Tdap) 01/23/2026   HPV Vaccine  Aged Out  *Topic was postponed. The date shown is not the original due date.    Advanced directives: (Declined) Advance directive discussed with you today. Even though you declined this today, please call our office should you change your mind, and we can give you the proper paperwork for you to fill out.  Next Medicare Annual Wellness Visit scheduled for next year: Yes  Preventive Care 40-64 Years, Female Preventive care refers to lifestyle choices and visits with your health care provider that can promote health and wellness. What does preventive care include? A yearly physical exam. This is also called an annual well check. Dental exams once or twice a year. Routine eye exams. Ask your health care provider how often you should have your eyes checked. Personal lifestyle choices, including: Daily care of your teeth and gums. Regular physical activity. Eating a healthy diet. Avoiding tobacco and drug use. Limiting  alcohol use. Practicing safe sex. Taking low-dose aspirin daily starting at age 39. Taking vitamin and mineral supplements as recommended by your health care provider. What happens during an annual well check? The services and screenings done by your health care provider during your annual well check will depend on your age, overall health, lifestyle risk factors, and family history of disease. Counseling  Your health care provider may ask you questions about your: Alcohol use. Tobacco use. Drug use. Emotional well-being. Home and relationship well-being. Sexual activity. Eating habits. Work and work Astronomer. Method of birth control. Menstrual cycle. Pregnancy history. Screening  You may have the following tests or measurements: Height, weight, and BMI. Blood pressure. Lipid and cholesterol levels. These may be checked every 5 years, or more frequently if you are over 59 years old. Skin check. Lung cancer screening. You may have this screening every year starting at age 95 if you have a 30-pack-year history of smoking and currently smoke or have quit within the past 15 years. Fecal occult blood test (FOBT) of the stool. You may have this test every year starting at age 43. Flexible sigmoidoscopy or colonoscopy. You may have a sigmoidoscopy every 5 years or a colonoscopy every 10 years starting at age 54. Hepatitis C blood test. Hepatitis B blood test. Sexually transmitted disease (STD) testing. Diabetes screening. This is done by checking your blood sugar (glucose) after you have not eaten for a while (fasting). You may have this done every 1-3 years. Mammogram. This  may be done every 1-2 years. Talk to your health care provider about when you should start having regular mammograms. This may depend on whether you have a family history of breast cancer. BRCA-related cancer screening. This may be done if you have a family history of breast, ovarian, tubal, or peritoneal  cancers. Pelvic exam and Pap test. This may be done every 3 years starting at age 79. Starting at age 22, this may be done every 5 years if you have a Pap test in combination with an HPV test. Bone density scan. This is done to screen for osteoporosis. You may have this scan if you are at high risk for osteoporosis. Discuss your test results, treatment options, and if necessary, the need for more tests with your health care provider. Vaccines  Your health care provider may recommend certain vaccines, such as: Influenza vaccine. This is recommended every year. Tetanus, diphtheria, and acellular pertussis (Tdap, Td) vaccine. You may need a Td booster every 10 years. Zoster vaccine. You may need this after age 39. Pneumococcal 13-valent conjugate (PCV13) vaccine. You may need this if you have certain conditions and were not previously vaccinated. Pneumococcal polysaccharide (PPSV23) vaccine. You may need one or two doses if you smoke cigarettes or if you have certain conditions. Talk to your health care provider about which screenings and vaccines you need and how often you need them. This information is not intended to replace advice given to you by your health care provider. Make sure you discuss any questions you have with your health care provider. Document Released: 02/25/2015 Document Revised: 10/19/2015 Document Reviewed: 11/30/2014 Elsevier Interactive Patient Education  2017 [employer].    Fall Prevention in the Home Falls can cause injuries. They can happen to people of all ages. There are many things you can do to make your home safe and to help prevent falls. What can I do on the outside of my home? Regularly fix the edges of walkways and driveways and fix any cracks. Remove anything that might make you trip as you walk through a door, such as a raised step or threshold. Trim any bushes or trees on the path to your home. Use bright outdoor lighting. Clear any walking paths of  anything that might make someone trip, such as rocks or tools. Regularly check to see if handrails are loose or broken. Make sure that both sides of any steps have handrails. Any raised decks and porches should have guardrails on the edges. Have any leaves, snow, or ice cleared regularly. Use sand or salt on walking paths during winter. Clean up any spills in your garage right away. This includes oil or grease spills. What can I do in the bathroom? Use night lights. Install grab bars by the toilet and in the tub and shower. Do not use towel bars as grab bars. Use non-skid mats or decals in the tub or shower. If you need to sit down in the shower, use a plastic, non-slip stool. Keep the floor dry. Clean up any water that spills on the floor as soon as it happens. Remove soap buildup in the tub or shower regularly. Attach bath mats securely with double-sided non-slip rug tape. Do not have throw rugs and other things on the floor that can make you trip. What can I do in the bedroom? Use night lights. Make sure that you have a light by your bed that is easy to reach. Do not use any sheets or blankets that are too  big for your bed. They should not hang down onto the floor. Have a firm chair that has side arms. You can use this for support while you get dressed. Do not have throw rugs and other things on the floor that can make you trip. What can I do in the kitchen? Clean up any spills right away. Avoid walking on wet floors. Keep items that you use a lot in easy-to-reach places. If you need to reach something above you, use a strong step stool that has a grab bar. Keep electrical cords out of the way. Do not use floor polish or wax that makes floors slippery. If you must use wax, use non-skid floor wax. Do not have throw rugs and other things on the floor that can make you trip. What can I do with my stairs? Do not leave any items on the stairs. Make sure that there are handrails on both  sides of the stairs and use them. Fix handrails that are broken or loose. Make sure that handrails are as long as the stairways. Check any carpeting to make sure that it is firmly attached to the stairs. Fix any carpet that is loose or worn. Avoid having throw rugs at the top or bottom of the stairs. If you do have throw rugs, attach them to the floor with carpet tape. Make sure that you have a light switch at the top of the stairs and the bottom of the stairs. If you do not have them, ask someone to add them for you. What else can I do to help prevent falls? Wear shoes that: Do not have high heels. Have rubber bottoms. Are comfortable and fit you well. Are closed at the toe. Do not wear sandals. If you use a stepladder: Make sure that it is fully opened. Do not climb a closed stepladder. Make sure that both sides of the stepladder are locked into place. Ask someone to hold it for you, if possible. Clearly mark and make sure that you can see: Any grab bars or handrails. First and last steps. Where the edge of each step is. Use tools that help you move around (mobility aids) if they are needed. These include: Canes. Walkers. Scooters. Crutches. Turn on the lights when you go into a dark area. Replace any light bulbs as soon as they burn out. Set up your furniture so you have a clear path. Avoid moving your furniture around. If any of your floors are uneven, fix them. If there are any pets around you, be aware of where they are. Review your medicines with your doctor. Some medicines can make you feel dizzy. This can increase your chance of falling. Ask your doctor what other things that you can do to help prevent falls. This information is not intended to replace advice given to you by your health care provider. Make sure you discuss any questions you have with your health care provider. Document Released: 11/25/2008 Document Revised: 07/07/2015 Document Reviewed: 03/05/2014 Elsevier  Interactive Patient Education  2017 [employer].

## 2022-09-19 NOTE — Progress Notes (Signed)
Subjective:   Karen Hardin is a 57 y.o. female who presents for Medicare Annual (Subsequent) preventive examination.  Visit Complete: Virtual  I connected with  [name] on 09/19/22 by a audio enabled telemedicine application and verified that I am speaking with the correct person using two identifiers.  Patient Location: Home  Provider Location: Home Office  I discussed the limitations of evaluation and management by telemedicine. The patient expressed understanding and agreed to proceed.  Patient Medicare AWV questionnaire was completed by the patient on  ; I have confirmed that all information answered by patient is correct and no changes since this date.  Review of Systems    Vital Signs: Unable to obtain new vitals due to this being a telehealth visit.  Cardiac Risk Factors include: advanced age (>31men, >104 women);diabetes mellitus     Objective:    Today's Vitals   09/19/22 1302 09/19/22 1304  Weight: 217 lb (98.4 kg)   Height: 5\' 7"  (1.702 m)   PainSc:  7    Body mass index is 33.99 kg/m.     09/19/2022    1:16 PM 10/02/2021    4:13 PM 04/12/2021   11:07 AM 10/31/2018    8:15 AM 10/07/2015    6:45 PM 11/23/2014    2:03 PM 09/28/2014    1:17 PM  Advanced Directives  Does Patient Have a Medical Advance Directive? No No No No No No No  Would patient like information on creating a medical advance directive? No - Patient declined   No - Patient declined No - patient declined information No - patient declined information     Current Medications (verified) Outpatient Encounter Medications as of 09/19/2022  Medication Sig   albuterol (VENTOLIN HFA) 108 (90 Base) MCG/ACT inhaler INHALE 2 PUFFS BY MOUTH EVERY 4 HOURS AS NEEDED FOR WHEEZING FOR SHORTNESS OF BREATH   Ascorbic Acid (VITAMIN C PO) Take 750 mg by mouth daily.   azithromycin (ZITHROMAX Z-PAK) 250 MG tablet As directed   Blood Glucose Monitoring Suppl (ONE TOUCH BASIC SYSTEM) w/Device KIT Test once  daily. Dx: E11.9   Cholecalciferol (VITAMIN D3) 125 MCG (5000 UT) TABS Take 1 tablet by mouth daily.   cyclobenzaprine (FLEXERIL) 10 MG tablet Take 1 tablet (10 mg total) by mouth 2 (two) times daily as needed for muscle spasms.   DULoxetine (CYMBALTA) 60 MG capsule Take 1 capsule (60 mg total) by mouth daily.   Ferrous Sulfate (IRON PO) Take 65 mg by mouth daily.   glipiZIDE (GLUCOTROL XL) 2.5 MG 24 hr tablet Take 1 tablet by mouth once daily with breakfast   glucose blood test strip Use as instructed   glucose blood test strip Use as instructed   lisinopril (ZESTRIL) 2.5 MG tablet Take 1 tablet (2.5 mg total) by mouth daily.   Multiple Vitamin (MULTIVITAMIN ADULT PO) Take 1 tablet by mouth daily.   OneTouch Delica Lancets 33G MISC Check blood sugars twice a day   OVER THE COUNTER MEDICATION Apply 1 application  topically as needed. Theragesic Cream.  Applying to neck & back.   pregabalin (LYRICA) 25 MG capsule Take 1 capsule (25 mg total) by mouth daily.   SUMAtriptan (IMITREX) 20 MG/ACT nasal spray Place 1 spray (20 mg total) into the nose every 2 (two) hours as needed for migraine or headache. May repeat in 2 hours if headache persists or recurs.  Maximum 2 sprays in 24 hours   topiramate (TOPAMAX) 25 MG tablet Take 1  tablet at bedtime for one week, then increase to 2 tablets at bedtime   vitamin B-12 (CYANOCOBALAMIN) 1000 MCG tablet Take 1,000 mcg by mouth daily.   zinc gluconate 50 MG tablet Take 50 mg by mouth daily.   No facility-administered encounter medications on file as of 09/19/2022.    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   Asthma 2002   uses inhaler as PRN   Depression    as a child, and stress recently   Diabetes mellitus without complication (HCC)    on meds   Fibrocystic breast 2005   Fibromyalgia     on meds   GERD (gastroesophageal reflux disease)    hx of   Hypertension    on meds   LGSIL (low grade squamous intraepithelial lesion) on Pap smear 2007   Libido, decreased 2010   Migraines    migraines - otc med prn, then maxalt if needed   Neuromuscular disorder (HCC)    Ovarian cyst    Stargardt's disease 1986   Patient is legally blind   Tachycardia    per patient diagnosed in ER 2014, no problems currently   Trichomonas    Vaginosis 2008   Vitamin D deficiency    on meds   Past Surgical History:  Procedure Laterality Date   BREAST EXCISIONAL BIOPSY Left    benign   BREAST SURGERY  1986   biopsy - benign   COLONOSCOPY  2016   JMP-MAC-suprep(good)-[facility]   CYSTOSCOPY  12/26/2012   Procedure: CYSTOSCOPY;  Surgeon: Kirkland Hun, MD;  Location: [facility];  Service: Gynecology;;   DILATION AND CURETTAGE OF UTERUS     ganglion cyst removed from wrist     left   LAPAROSCOPIC ASSISTED VAGINAL HYSTERECTOMY N/A 12/26/2012   Procedure: LAPAROSCOPIC ASSISTED VAGINAL HYSTERECTOMY Uterine Morcellation, Bilateral Salpingectomy, ;  Surgeon: Kirkland Hun, MD;  Location: [facility];  Service: Gynecology;  Laterality: N/A;   MYOMECTOMY     In [place] - laparotomy   OOPHORECTOMY  2000   laparotomy in [place]   SHOULDER ARTHROSCOPY Left 2021   SHOULDER ARTHROSCOPY W/ ROTATOR CUFF REPAIR Right 2006   TONSILLECTOMY AND ADENOIDECTOMY     TUBAL LIGATION  2000   WISDOM TOOTH EXTRACTION     x 2 teeth   WISDOM TOOTH EXTRACTION     Family History  Problem Relation Age of Onset   Arthritis Mother    Diabetes Mother    Hypertension Mother    Pulmonary embolism Mother        Cardiac arrest    Diverticulitis Mother    Heart disease Father    Heart attack Father    Mesothelioma Father    Hyperlipidemia Maternal Grandmother    Breast cancer Maternal Grandmother    Colitis Paternal Aunt        in 3's   Stomach cancer Paternal Aunt    Heart attack Paternal Aunt    Heart attack  Paternal Uncle    Stroke Paternal Aunt    Breast cancer Maternal Aunt    Colon polyps Sister 24   Diverticulitis Sister    Colon cancer Neg Hx    Esophageal cancer Neg Hx    Rectal cancer Neg Hx    Social History   Socioeconomic History  Marital status: Married    Spouse name: Not on file   Number of children: 3   Years of education: Not on file   Highest education level: Some college, no degree  Occupational History   Not on file  Tobacco Use   Smoking status: Some Days    Current packs/day: 0.10    Average packs/day: 0.1 packs/day for 20.0 years (2.0 ttl pk-yrs)    Types: Cigarettes    Start date: 02/28/2015   Smokeless tobacco: Never  Vaping Use   Vaping status: Never Used  Substance and Sexual Activity   Alcohol use: No    Alcohol/week: 0.0 standard drinks of alcohol   Drug use: No   Sexual activity: Yes    Birth control/protection: Surgical, Condom  Other Topics Concern   Not on file  Social History Narrative   Is on disability    Married for 21 years    Three boys ( all three live locally)       She likes to hang out with friends. 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 Right handed   Drinks caffeine   One story home   Social Determinants of Health   Financial Resource Strain: Low Risk  (09/19/2022)   Overall Financial Resource Strain (CARDIA)    Difficulty of Paying Living Expenses: Not hard at all  Food Insecurity: No Food Insecurity (09/19/2022)   Hunger Vital Sign    Worried About Running Out of Food in the Last Year: Never true    Ran Out of Food in the Last Year: Never true  Transportation Needs: No Transportation Needs (09/19/2022)   PRAPARE - Administrator, Civil Service (Medical): No    Lack of Transportation (Non-Medical): No  Physical Activity: Insufficiently Active (09/19/2022)   Exercise Vital Sign    Days of Exercise per Week: 3 days    Minutes of Exercise per Session: 10 min  Stress: No Stress  Concern Present (09/19/2022)   [employer] of Occupational Health - Occupational Stress Questionnaire    Feeling of Stress : Not at all  Social Connections: Socially Integrated (09/19/2022)   Social Connection and Isolation Panel [NHANES]    Frequency of Communication with Friends and Family: More than three times a week    Frequency of Social Gatherings with Friends and Family: More than three times a week    Attends Religious Services: More than 4 times per year    Active Member of [employer] or Organizations: Yes    Attends Engineer, structural: More than 4 times per year    Marital Status: Married    Tobacco Counseling Ready to quit: Yes Counseling given: Yes   Clinical Intake:  Pre-visit preparation completed: No  Pain : 0-10 Pain Score: 7  Pain Type: Chronic pain Pain Location: Back Pain Orientation: Lower Pain Descriptors / Indicators: Constant Pain Onset: More than a month ago Pain Frequency: Constant Pain Relieving Factors: Rx and OTC med Effect of Pain on Daily Activities: Somewhat effictive  Pain Relieving Factors: Rx and OTC med  BMI - recorded: 33.99 Nutritional Status: BMI > 30  Obese Nutritional Risks: None Diabetes: Yes CBG done?: No Did pt. bring in CBG monitor from home?: No  How often do you need to have someone help you when you read instructions, pamphlets, or other written materials from your doctor or pharmacy?: 5 - Always (Dx: Legally blind)  Interpreter Needed?: No  Information entered by :: Meriam Sprague  Gedeon Brandow LPN   Activities of Daily Living    09/19/2022    1:14 PM 10/02/2021    4:14 PM  In your present state of health, do you have any difficulty performing the following activities:  Hearing? 0 0  Vision? 0 1  Comment  legally blind  Difficulty concentrating or making decisions? 0 1  Comment  some forgetness  Walking or climbing stairs? 1 0  Comment Dx Legally Blind   Dressing or bathing? 0 0  Doing errands, shopping? 1 1   Comment Family Assist legally blind, does not drive  Preparing Food and eating ? N N  Using the Toilet? N N  In the past six months, have you accidently leaked urine? N Y  Do you have problems with loss of bowel control? N N  Managing your Medications? N N  Managing your Finances? N N  Housekeeping or managing your Housekeeping? N N    Patient Care Team: Shirline Frees, NP as PCP - General (Family Medicine) Lutricia Feil, [facility] as Pharmacist (Pharmacist)  Indicate any recent Medical Services you may have received from other than [facility] providers in the past year (date may be approximate).     Assessment:   This is a routine wellness examination for Ceairra.  Hearing/Vision screen Hearing Screening - Comments:: Denies hearing difficulties   Vision Screening - Comments:: Wears rx glasses - up to date with routine eye exams with  Maggie Schwalbe Dx: Legally Blind  Dietary issues and exercise activities discussed:     Goals Addressed               This Visit's Progress     Lose weight (pt-stated)         Depression Screen    09/19/2022    1:13 PM 10/02/2021    4:14 PM 04/27/2021    1:35 PM 11/27/2019    1:17 PM  PHQ 2/9 Scores  PHQ - 2 Score 0 0 2 0  PHQ- 9 Score   3     Fall Risk    09/19/2022    1:15 PM 10/02/2021    4:14 PM 04/12/2021   11:07 AM  Fall Risk   Falls in the past year? 0 0 0  Number falls in past yr: 0 0 0  Injury with Fall? 0 0 0  Risk for fall due to : No Fall Risks Medication side effect;Impaired vision   Follow up Falls prevention discussed Falls evaluation completed;Education provided;Falls prevention discussed     MEDICARE RISK AT HOME:  Medicare Risk at Home - 09/19/22 1322     Any stairs in or around the home? Yes    If so, are there any without handrails? No    Home free of loose throw rugs in walkways, pet beds, electrical cords, etc? Yes    Adequate lighting in your home to reduce risk of falls? Yes    Life alert? No    Use of  a cane, walker or w/c? No    Grab bars in the bathroom? Yes    Shower chair or bench in shower? No    Elevated toilet seat or a handicapped toilet? No             TIMED UP AND GO:  Was the test performed?  No    Cognitive Function:        09/19/2022    1:16 PM 10/02/2021    4:17 PM  6CIT Screen  What Year? 0  points 0 points  What month? 0 points 0 points  What time? 0 points 0 points  Count back from 20 0 points 0 points  Months in reverse 0 points 0 points  Repeat phrase 0 points 0 points  Total Score 0 points 0 points    Immunizations Immunization History  Administered Date(s) Administered   Influenza,inj,Quad PF,6+ Mos 12/27/2012, 03/08/2015, 01/24/2016, 03/29/2017, 11/20/2018, 10/27/2021   Janssen (J&J) SARS-COV-2 Vaccination 04/21/2019   Moderna SARS-COV2 Booster Vaccination 04/25/2020   Pneumococcal Polysaccharide-23 12/27/2012   Tdap 01/24/2016    TDAP status: Up to date  Flu Vaccine status: Due, Education has been provided regarding the importance of this vaccine. Advised may receive this vaccine at local pharmacy or Health Dept. Aware to provide a copy of the vaccination record if obtained from local pharmacy or Health Dept. Verbalized acceptance and understanding.    Covid-19 vaccine status: Declined, Education has been provided regarding the importance of this vaccine but patient still declined. Advised may receive this vaccine at local pharmacy or Health Dept.or vaccine clinic. Aware to provide a copy of the vaccination record if obtained from local pharmacy or Health Dept. Verbalized acceptance and understanding.  Qualifies for Shingles Vaccine? Yes   Zostavax completed No   Shingrix Completed?: No.    Education has been provided regarding the importance of this vaccine. Patient has been advised to call insurance company to determine out of pocket expense if they have not yet received this vaccine. Advised may also receive vaccine at local pharmacy or  Health Dept. Verbalized acceptance and understanding.  Screening Tests Health Maintenance  Topic Date Due   Hepatitis C Screening  Never done   Zoster Vaccines- Shingrix (1 of 2) Never done   COVID-19 Vaccine (3 - 2023-24 season) 10/13/2021   INFLUENZA VACCINE  09/13/2022   HEMOGLOBIN A1C  09/14/2022   HIV Screening  05/10/2024 (Originally 10/11/1980)   Diabetic kidney evaluation - eGFR measurement  10/28/2022   FOOT EXAM  10/28/2022   Diabetic kidney evaluation - Urine ACR  03/17/2023   OPHTHALMOLOGY EXAM  07/20/2023   Medicare Annual Wellness (AWV)  09/19/2023   MAMMOGRAM  04/15/2024   Colonoscopy  07/20/2025   DTaP/Tdap/Td (2 - Td or Tdap) 01/23/2026   HPV VACCINES  Aged Out    Health Maintenance  Health Maintenance Due  Topic Date Due   Hepatitis C Screening  Never done   Zoster Vaccines- Shingrix (1 of 2) Never done   COVID-19 Vaccine (3 - 2023-24 season) 10/13/2021   INFLUENZA VACCINE  09/13/2022   HEMOGLOBIN A1C  09/14/2022    Colorectal cancer screening: Type of screening: Colonoscopy. Completed 07/20/20. Repeat every 5 years  Mammogram status: Completed 04/16/22. Repeat every year    Lung Cancer Screening: (Low Dose CT Chest recommended if Age 50-80 years, 20 pack-year currently smoking OR have quit w/in 15years.) does qualify.   Lung Cancer Screening Referral: Deferred  Additional Screening:  Hepatitis C Screening: does qualify; Deferred  Vision Screening: Recommended annual ophthalmology exams for early detection of glaucoma and other disorders of the eye. Is the patient up to date with their annual eye exam?  Yes  Who is the provider or what is the name of the office in which the patient attends annual eye exams? [facility] If pt is not established with a provider, would they like to be referred to a provider to establish care? No .   Dental Screening: Recommended annual dental exams for proper oral hygiene  Diabetic  Foot Exam: Diabetic Foot Exam:  Completed 10/27/21  Community Resource Referral / Chronic Care Management:  CRR required this visit?  No   CCM required this visit?  No     Plan:     I have personally reviewed and noted the following in the patient's chart:   Medical and social history Use of alcohol, tobacco or illicit drugs  Current medications and supplements including opioid prescriptions. Patient is not currently taking opioid prescriptions. Functional ability and status Nutritional status Physical activity Advanced directives List of other physicians Hospitalizations, surgeries, and ER visits in previous 12 months Vitals Screenings to include cognitive, depression, and falls Referrals and appointments  In addition, I have reviewed and discussed with patient certain preventive protocols, quality metrics, and best practice recommendations. A written personalized care plan for preventive services as well as general preventive health recommendations were provided to patient.     Tillie Rung, LPN   4/0/9811   After Visit Summary: (MyChart) Due to this being a telephonic visit, the after visit summary with patients personalized plan was offered to patient via MyChart   Nurse Notes: None

## 2022-10-24 DIAGNOSIS — M25512 Pain in left shoulder: Secondary | ICD-10-CM | POA: Diagnosis not present

## 2022-10-24 DIAGNOSIS — M545 Low back pain, unspecified: Secondary | ICD-10-CM | POA: Diagnosis not present

## 2022-10-24 DIAGNOSIS — M47816 Spondylosis without myelopathy or radiculopathy, lumbar region: Secondary | ICD-10-CM | POA: Diagnosis not present

## 2022-10-24 DIAGNOSIS — M19012 Primary osteoarthritis, left shoulder: Secondary | ICD-10-CM | POA: Diagnosis not present

## 2022-10-24 DIAGNOSIS — M47896 Other spondylosis, lumbar region: Secondary | ICD-10-CM | POA: Diagnosis not present

## 2023-01-30 ENCOUNTER — Ambulatory Visit (INDEPENDENT_AMBULATORY_CARE_PROVIDER_SITE_OTHER): Payer: Medicare HMO | Admitting: Family Medicine

## 2023-01-30 VITALS — BP 158/100 | HR 90 | Temp 98.1°F | Ht 67.0 in | Wt 218.2 lb

## 2023-01-30 DIAGNOSIS — R519 Headache, unspecified: Secondary | ICD-10-CM | POA: Diagnosis not present

## 2023-01-30 DIAGNOSIS — R42 Dizziness and giddiness: Secondary | ICD-10-CM | POA: Diagnosis not present

## 2023-01-30 DIAGNOSIS — E538 Deficiency of other specified B group vitamins: Secondary | ICD-10-CM | POA: Diagnosis not present

## 2023-01-30 DIAGNOSIS — E1165 Type 2 diabetes mellitus with hyperglycemia: Secondary | ICD-10-CM | POA: Diagnosis not present

## 2023-01-30 LAB — CBC WITH DIFFERENTIAL/PLATELET
Basophils Absolute: 0.1 10*3/uL (ref 0.0–0.1)
Basophils Relative: 0.8 % (ref 0.0–3.0)
Eosinophils Absolute: 0.1 10*3/uL (ref 0.0–0.7)
Eosinophils Relative: 1.3 % (ref 0.0–5.0)
HCT: 40 % (ref 36.0–46.0)
Hemoglobin: 13.2 g/dL (ref 12.0–15.0)
Lymphocytes Relative: 31.4 % (ref 12.0–46.0)
Lymphs Abs: 2.7 10*3/uL (ref 0.7–4.0)
MCHC: 32.9 g/dL (ref 30.0–36.0)
MCV: 87.8 fL (ref 78.0–100.0)
Monocytes Absolute: 0.6 10*3/uL (ref 0.1–1.0)
Monocytes Relative: 6.9 % (ref 3.0–12.0)
Neutro Abs: 5.1 10*3/uL (ref 1.4–7.7)
Neutrophils Relative %: 59.6 % (ref 43.0–77.0)
Platelets: 286 10*3/uL (ref 150.0–400.0)
RBC: 4.56 Mil/uL (ref 3.87–5.11)
RDW: 13.3 % (ref 11.5–15.5)
WBC: 8.6 10*3/uL (ref 4.0–10.5)

## 2023-01-30 LAB — COMPREHENSIVE METABOLIC PANEL
ALT: 12 U/L (ref 0–35)
AST: 11 U/L (ref 0–37)
Albumin: 4.2 g/dL (ref 3.5–5.2)
Alkaline Phosphatase: 88 U/L (ref 39–117)
BUN: 6 mg/dL (ref 6–23)
CO2: 28 meq/L (ref 19–32)
Calcium: 9.4 mg/dL (ref 8.4–10.5)
Chloride: 107 meq/L (ref 96–112)
Creatinine, Ser: 0.73 mg/dL (ref 0.40–1.20)
GFR: 90.73 mL/min (ref >60.00)
Glucose, Bld: 115 mg/dL — ABNORMAL HIGH (ref 70–99)
Potassium: 3.9 meq/L (ref 3.5–5.1)
Sodium: 142 meq/L (ref 135–145)
Total Bilirubin: 0.6 mg/dL (ref 0.2–1.2)
Total Protein: 6.8 g/dL (ref 6.0–8.3)

## 2023-01-30 LAB — HEMOGLOBIN A1C: Hgb A1c MFr Bld: 7 % — ABNORMAL HIGH (ref 4.6–6.5)

## 2023-01-30 LAB — VITAMIN B12: Vitamin B-12: 255 pg/mL (ref 211–911)

## 2023-01-30 LAB — SEDIMENTATION RATE: Sed Rate: 25 mm/h (ref 0–30)

## 2023-01-30 MED ORDER — AMLODIPINE BESYLATE 5 MG PO TABS: 5.0000 mg | ORAL_TABLET | Freq: Every day | ORAL | 3 refills | Status: DC

## 2023-01-30 MED ORDER — BLOOD GLUCOSE MONITOR KIT: PACK | 0 refills | Status: DC

## 2023-01-30 NOTE — Progress Notes (Signed)
Established Patient Office Visit  Subjective   Patient ID: Karen Hardin, female    DOB: 1966/01/04  Age: 57 y.o. 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  She is seen today as a work in with nonspecific symptoms of intermittent headache and intermittent dizziness past couple of days.  She had to leave work early yesterday and out today.  Monday she noticed when getting up some lightheadedness.  No syncope.  No vertigo.  This occurred when she was at a choir rehearsal.  She had some similar intermittent symptoms on Tuesday.  Her headaches have been more right parietal and not typical of her usual migraines.  Denies any exertional headache.  Usually has light sensitivity with migraines.  Denies any head injury or trauma.  Blood pressure is quite elevated today but not typical for her.  She is on low-dose lisinopril 2.5 mg what sounds like more for renal protection.  No chest pains.  No dyspnea.  No pleuritic pain.  No fevers or chills.  No recent cough.  No nausea or vomiting.  Denies any recent palpitations.  No recent A1c.  She is unsure regarding her recent diabetic control.  Last A1c on record several months ago was 6.6%.  She does have B12 deficiency but currently not on replacement.  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   Asthma 2002   uses inhaler as PRN   Depression    as a child, and stress recently   Diabetes mellitus without complication (HCC)    on meds   Fibrocystic breast 2005   Fibromyalgia    on meds   GERD  (gastroesophageal reflux disease)    hx of   Hypertension    on meds   LGSIL (low grade squamous intraepithelial lesion) on Pap smear 2007   Libido, decreased 2010   Migraines    migraines - otc med prn, then maxalt if needed   Neuromuscular disorder (HCC)    Ovarian cyst    Stargardt's disease 1986   Patient is legally blind   Tachycardia    per patient diagnosed in ER 2014, no problems currently   Trichomonas    Vaginosis 2008   Vitamin D deficiency    on meds   Past Surgical History:  Procedure Laterality Date   BREAST EXCISIONAL BIOPSY Left    benign   BREAST SURGERY  1986   biopsy - benign   COLONOSCOPY  2016   JMP-MAC-suprep(good)-SSP   CYSTOSCOPY  12/26/2012   Procedure: CYSTOSCOPY;  Surgeon: Kirkland Hun, MD;  Location: [facility];  Service: Gynecology;;   DILATION AND CURETTAGE OF UTERUS     ganglion cyst removed from wrist     left   LAPAROSCOPIC ASSISTED VAGINAL HYSTERECTOMY N/A 12/26/2012   Procedure: LAPAROSCOPIC ASSISTED VAGINAL HYSTERECTOMY Uterine Morcellation, Bilateral Salpingectomy, ;  Surgeon: Kirkland Hun, MD;  Location: [facility];  Service: Gynecology;  Laterality: N/A;   [place]ECTOMY     [place]   OOPHORECTOMY  2000   [place] in [place]   SHOULDER ARTHROSCOPY Left 2021   SHOULDER ARTHROSCOPY W/ ROTATOR CUFF REPAIR Right 2006   TONSILLECTOMY AND ADENOIDECTOMY     TUBAL LIGATION  2000   WISDOM TOOTH EXTRACTION     x 2 teeth   WISDOM TOOTH EXTRACTION      reports that she has been smoking cigarettes. She started smoking about 7 years ago. She has a 2 pack-year smoking history. She has never used smokeless tobacco. She reports that she does not drink alcohol and does not use drugs. family history includes Arthritis in her mother; Breast cancer in her maternal aunt and maternal grandmother; Colitis in her paternal aunt; Colon polyps (age of onset: 46) in her sister; Diabetes in her mother; Diverticulitis in her mother and sister;  Heart attack in her father, paternal aunt, and paternal uncle; Heart disease in her father; Hyperlipidemia in her maternal grandmother; Hypertension in her mother; Mesothelioma in her father; Pulmonary embolism in her mother; Stomach cancer in her paternal aunt; Stroke in her paternal aunt. Allergies  Allergen Reactions   Cholestatin     Review of Systems  Constitutional:  Negative for chills and fever.  HENT:  Negative for hearing loss and tinnitus.   Eyes:        Chronic vision loss bilaterally and unchanged.  Respiratory:  Negative for cough and shortness of breath.   Cardiovascular:  Negative for chest pain.  Gastrointestinal:  Negative for abdominal pain.  Genitourinary:  Negative for dysuria.  Neurological:  Positive for dizziness and headaches. Negative for tremors, sensory change, speech change, focal weakness, seizures and loss of consciousness.      Objective:     BP (!) 158/100 (BP Location: Left Arm, Cuff Size: Normal)   Pulse 90   Temp 98.1 F (36.7 C) (Oral)   Ht 5\' 7"  (1.702 m)   Wt 218 lb 3.2 oz (99 kg)   LMP 12/14/2012   SpO2 100%   BMI 34.17 kg/m  BP Readings from Last 3 Encounters:  01/30/23 (!) 158/100  09/05/22 128/82  03/30/22 138/80   Wt Readings from Last 3 Encounters:  01/30/23 218 lb 3.2 oz (99 kg)  09/19/22 217 lb (98.4 kg)  03/30/22 217 lb (98.4 kg)      Physical Exam Vitals reviewed.  Constitutional:      Appearance: She is well-developed.  HENT:     Head:     Comments: No temporal artery tenderness Eyes:     Extraocular Movements: Extraocular movements intact.     Pupils: Pupils are equal, round, and reactive to light.  Neck:     Thyroid: No thyromegaly.     Vascular: No JVD.  Cardiovascular:     Rate and Rhythm: Normal rate and regular rhythm.     Heart sounds:     No gallop.  Pulmonary:     Effort: Pulmonary effort is normal. No respiratory distress.     Breath sounds: Normal breath sounds. No wheezing or rales.   Musculoskeletal:     Cervical back: Neck supple.  Lymphadenopathy:     Cervical: No cervical adenopathy.  Neurological:     Mental Status: She is alert and oriented to person, place, and time.     Cranial Nerves: No cranial nerve deficit, dysarthria or facial asymmetry.     Motor: No weakness.     Coordination: Coordination normal.     Gait: Gait normal.  Psychiatric:  Mood and Affect: Mood normal.      No results found for any visits on 01/30/23.  Last CBC Lab Results  Component Value Date   WBC 10.1 10/27/2021   HGB 13.2 10/27/2021   HCT 40.2 10/27/2021   MCV 87.5 10/27/2021   MCH 28.2 03/17/2015   RDW 13.1 10/27/2021   PLT 278.0 10/27/2021   Last metabolic panel Lab Results  Component Value Date   GLUCOSE 92 10/27/2021   NA 140 10/27/2021   K 3.6 10/27/2021   CL 105 10/27/2021   CO2 26 10/27/2021   BUN 7 10/27/2021   CREATININE 0.81 10/27/2021   GFR 80.79 10/27/2021   CALCIUM 10.0 10/27/2021   PROT 7.6 10/27/2021   ALBUMIN 4.2 10/27/2021   BILITOT 0.7 10/27/2021   ALKPHOS 92 10/27/2021   AST 13 10/27/2021   ALT 11 10/27/2021   Last lipids Lab Results  Component Value Date   CHOL 159 10/27/2021   HDL 43.80 10/27/2021   LDLCALC 96 10/27/2021   TRIG 97.0 10/27/2021   CHOLHDL 4 10/27/2021   Last hemoglobin A1c Lab Results  Component Value Date   HGBA1C 6.6 (H) 03/16/2022   Last vitamin B12 and Folate Lab Results  Component Value Date   VITAMINB12 388 10/16/2019      The 10-year ASCVD risk score (Arnett DK, et al., 2019) is: 42.5%    Assessment & Plan:   Problem List Items Addressed This Visit       Unprioritized   Type 2 diabetes mellitus with hyperglycemia (HCC) - Primary   Relevant Orders   Hemoglobin A1c   Other Visit Diagnoses       Dizziness       Relevant Orders   CBC with Differential/Platelet   CMP     B12 deficiency       Relevant Orders   Vitamin B12     Acute nonintractable headache, unspecified headache type        Relevant Medications   amLODipine (NORVASC) 5 MG tablet   Other Relevant Orders   Sedimentation rate     Ms. Ivers has chronic problems as above.  She presents with 2-day history of some nonspecific dizziness and mostly right-sided headache which has been intermittent.  Her blood pressure is quite elevated today without history of diagnosis of hypertension.  Repeat after rest seated left arm 158/100.  Unclear if her symptoms are related to her elevated blood pressure.  Nonfocal neuroexam.  No focal weakness.  Does not have any red flags such as confusion, speech change, acute vision change, focal weakness, seizure, facial droop, etc. she does have a history of B12 deficiency currently not on replacement.  -Check further labs with CBC and CMP -Doubt temporal arteritis but with her age of 42 and new onset headache check sed rate -Given degree of blood pressure elevation in a couple of recent borderline elevations as well initiate amlodipine 5 mg daily and set up follow-up with primary within a couple weeks to reassess -Keep dietary sodium down -Recheck B12 level -Follow-up immediately for any progressive headache or new concerns  Return in about 2 weeks (around 02/13/2023).    Evelena Peat, MD

## 2023-02-20 ENCOUNTER — Ambulatory Visit: Payer: Medicare HMO | Admitting: Adult Health

## 2023-03-01 ENCOUNTER — Ambulatory Visit (INDEPENDENT_AMBULATORY_CARE_PROVIDER_SITE_OTHER): Payer: Medicare HMO | Admitting: Adult Health

## 2023-03-01 ENCOUNTER — Encounter: Payer: Self-pay | Admitting: Adult Health

## 2023-03-01 VITALS — BP 138/90 | HR 97 | Temp 97.8°F | Wt 217.0 lb

## 2023-03-01 DIAGNOSIS — Z7984 Long term (current) use of oral hypoglycemic drugs: Secondary | ICD-10-CM

## 2023-03-01 DIAGNOSIS — G43809 Other migraine, not intractable, without status migrainosus: Secondary | ICD-10-CM

## 2023-03-01 DIAGNOSIS — E119 Type 2 diabetes mellitus without complications: Secondary | ICD-10-CM | POA: Diagnosis not present

## 2023-03-01 DIAGNOSIS — J45909 Unspecified asthma, uncomplicated: Secondary | ICD-10-CM | POA: Diagnosis not present

## 2023-03-01 DIAGNOSIS — I10 Essential (primary) hypertension: Secondary | ICD-10-CM

## 2023-03-01 MED ORDER — AMLODIPINE BESYLATE 10 MG PO TABS: 10.0000 mg | ORAL_TABLET | Freq: Every day | ORAL | 0 refills | Status: DC

## 2023-03-01 MED ORDER — ALBUTEROL SULFATE HFA 108 (90 BASE) MCG/ACT IN AERS: 1.0000 | INHALATION_SPRAY | Freq: Four times a day (QID) | RESPIRATORY_TRACT | 1 refills | Status: AC | PRN

## 2023-03-01 MED ORDER — RIZATRIPTAN BENZOATE 5 MG PO TABS: 5.0000 mg | ORAL_TABLET | ORAL | 2 refills | Status: AC | PRN

## 2023-03-01 MED ORDER — LISINOPRIL 2.5 MG PO TABS: 2.5000 mg | ORAL_TABLET | Freq: Every day | ORAL | 0 refills | Status: DC

## 2023-03-01 MED ORDER — GLIPIZIDE ER 2.5 MG PO TB24: 2.5000 mg | ORAL_TABLET | Freq: Every day | ORAL | 0 refills | Status: DC

## 2023-03-01 NOTE — Progress Notes (Signed)
Subjective:    Patient ID: Karen Hardin, female    DOB: August 27, 1965, 58 y.o.   MRN: [id number]  HPI  89-year-old female who is being evaluated today for follow-up regarding hypertension.  She was seen by another provider in the office roughly a month ago at which time she had elevated blood pressure and headaches.  She was subsequently placed on Norvasc 5 mg daily.  She has been taking her medication daily and checking her blood pressures at home periodically with blood pressure readings in the 130s to 140s over 90s.  She is on lisinopril 2.5 mg but this is more for renal protection.    She continues to have migraine headaches but she is a long history of migraine headaches.  In the past she was seen by neurology but is no longer seeing them.  She does report that her migraines resolved but then in 2023/12/18 her mother passed away and migraine started to come back, stress triggers her migraines.  Would like a refill of her Maxalt today.  Additionally lab work was done a month ago and it noticed that her A1c had increased to 7.0.  She is prescribed glipizide 2.5 mg extended release daily and this has controlled her in the past.  Last prescription for 90 days was sent in about a year ago.    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   Asthma 2002   uses inhaler as PRN   Depression    as a child, and stress recently   Diabetes mellitus without complication (HCC)    on meds   Fibrocystic breast 2005   Fibromyalgia    on meds   GERD (gastroesophageal reflux disease)    hx of   Hypertension    on meds   LGSIL (low grade squamous intraepithelial lesion) on Pap smear 2007   Libido, decreased 2010   Migraines    migraines - otc med prn, then maxalt if needed   Neuromuscular disorder (HCC)    Ovarian cyst    Stargardt's disease 1986    Patient is legally blind   Tachycardia    per patient diagnosed in ER 2014, no problems currently   Trichomonas    Vaginosis 2008   Vitamin D deficiency    on meds    Social History   Socioeconomic History   Marital status: Married    Spouse name: Not on file   Number of children: 3   Years of education: Not on file   Highest education level: Some college, no degree  Occupational History   Not on file  Tobacco Use   Smoking status: Some Days    Current packs/day: 0.10    Average packs/day: 0.1 packs/day for 20.4 years (2.0 ttl pk-yrs)    Types: Cigarettes    Start date: 02/28/2015   Smokeless tobacco: Never  Vaping Use   Vaping status: Never Used  Substance and Sexual Activity   Alcohol use: No    Alcohol/week: 0.0 standard drinks of alcohol   Drug use: No   Sexual activity: Yes    Birth control/protection: Surgical, Condom  Other Topics Concern   Not on file  Social History Narrative   Is on disability    Married for 21 years    Three  boys ( all three live locally)       She likes to hang out with friends. 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 Right handed   Drinks caffeine   One story home   Social Drivers of Health   Financial Resource Strain: Low Risk  (01/30/2023)   Overall Financial Resource Strain (CARDIA)    Difficulty of Paying Living Expenses: Not very hard  Food Insecurity: No Food Insecurity (01/30/2023)   Hunger Vital Sign    Worried About Running Out of Food in the Last Year: Never true    Ran Out of Food in the Last Year: Never true  Transportation Needs: Unknown (01/30/2023)   PRAPARE - Administrator, Civil Service (Medical): Patient declined    Lack of Transportation (Non-Medical): No  Physical Activity: Inactive (01/30/2023)   Exercise Vital Sign    Days of Exercise per Week: 0 days    Minutes of Exercise per Session: 10 min  Stress: Stress Concern Present (01/30/2023)   [employer] of  Occupational Health - Occupational Stress Questionnaire    Feeling of Stress : To some extent  Social Connections: Socially Integrated (01/30/2023)   Social Connection and Isolation Panel [NHANES]    Frequency of Communication with Friends and Family: More than three times a week    Frequency of Social Gatherings with Friends and Family: Three times a week    Attends Religious Services: More than 4 times per year    Active Member of Clubs or Organizations: Yes    Attends Banker Meetings: More than 4 times per year    Marital Status: Married  Catering manager Violence: Not At Risk (09/19/2022)   Humiliation, Afraid, Rape, and Kick questionnaire    Fear of Current or Ex-Partner: No    Emotionally Abused: No    Physically Abused: No    Sexually Abused: No    Past Surgical History:  Procedure Laterality Date   BREAST EXCISIONAL BIOPSY Left    benign   BREAST SURGERY  1986   biopsy - benign   COLONOSCOPY  2016   JMP-MAC-suprep(good)-SSP   CYSTOSCOPY  12/26/2012   Procedure: CYSTOSCOPY;  Surgeon: Kirkland Hun, MD;  Location: [facility];  Service: Gynecology;;   DILATION AND CURETTAGE OF UTERUS     ganglion cyst removed from wrist     left   LAPAROSCOPIC ASSISTED VAGINAL HYSTERECTOMY N/A 12/26/2012   Procedure: LAPAROSCOPIC ASSISTED VAGINAL HYSTERECTOMY Uterine Morcellation, Bilateral Salpingectomy, ;  Surgeon: Kirkland Hun, MD;  Location: [facility];  Service: Gynecology;  Laterality: N/A;   MYOMECTOMY     In [place] - laparotomy   OOPHORECTOMY  2000   [place] in [place]   SHOULDER ARTHROSCOPY Left 2021   SHOULDER ARTHROSCOPY W/ ROTATOR CUFF REPAIR Right 2006   TONSILLECTOMY AND ADENOIDECTOMY     TUBAL LIGATION  2000   WISDOM TOOTH EXTRACTION     x 2 teeth   WISDOM TOOTH EXTRACTION      Family History  Problem Relation Age of Onset   Arthritis Mother    Diabetes Mother    Hypertension Mother    Pulmonary embolism Mother        Cardiac arrest     Diverticulitis Mother    Heart disease Father    Heart attack Father    Mesothelioma Father    Hyperlipidemia Maternal Grandmother    Breast cancer Maternal Grandmother    Colitis Paternal Aunt  in 70's   Stomach cancer Paternal Aunt    Heart attack Paternal Aunt    Heart attack Paternal Uncle    Stroke Paternal Aunt    Breast cancer Maternal Aunt    Colon polyps Sister 26   Diverticulitis Sister    Colon cancer Neg Hx    Esophageal cancer Neg Hx    Rectal cancer Neg Hx     Allergies  Allergen Reactions   Cholestatin     Current Outpatient Medications on File Prior to Visit  Medication Sig Dispense Refill   albuterol (VENTOLIN HFA) 108 (90 Base) MCG/ACT inhaler INHALE 2 PUFFS BY MOUTH EVERY 4 HOURS AS NEEDED FOR WHEEZING FOR SHORTNESS OF BREATH 18 g 0   amLODipine (NORVASC) 5 MG tablet Take 1 tablet (5 mg total) by mouth daily. 30 tablet 3   Ascorbic Acid (VITAMIN C PO) Take 750 mg by mouth daily.     blood glucose meter kit and supplies KIT Dispense based on patient and insurance preference. Use up to four times daily as directed. 1 each 0   Blood Glucose Monitoring Suppl (ONE TOUCH BASIC SYSTEM) w/Device KIT Test once daily. Dx: E11.9 1 each 0   Cholecalciferol (VITAMIN D3) 125 MCG (5000 UT) TABS Take 1 tablet by mouth daily.     cyclobenzaprine (FLEXERIL) 10 MG tablet Take 1 tablet (10 mg total) by mouth 2 (two) times daily as needed for muscle spasms. 20 tablet 0   DULoxetine (CYMBALTA) 60 MG capsule Take 1 capsule (60 mg total) by mouth daily. 90 capsule 0   Ferrous Sulfate (IRON PO) Take 65 mg by mouth daily.     glipiZIDE (GLUCOTROL XL) 2.5 MG 24 hr tablet Take 1 tablet by mouth once daily with breakfast 90 tablet 0   glucose blood test strip Use as instructed 200 each 3   glucose blood test strip Use as instructed 100 each 12   lisinopril (ZESTRIL) 2.5 MG tablet Take 1 tablet (2.5 mg total) by mouth daily. 90 tablet 3   Multiple Vitamin (MULTIVITAMIN ADULT PO)  Take 1 tablet by mouth daily.     OneTouch Delica Lancets 33G MISC Check blood sugars twice a day 200 each 3   OVER THE COUNTER MEDICATION Apply 1 application  topically as needed. Theragesic Cream.  Applying to neck & back.     SUMAtriptan (IMITREX) 20 MG/ACT nasal spray Place 1 spray (20 mg total) into the nose every 2 (two) hours as needed for migraine or headache. May repeat in 2 hours if headache persists or recurs.  Maximum 2 sprays in 24 hours 6 each 5   vitamin B-12 (CYANOCOBALAMIN) 1000 MCG tablet Take 1,000 mcg by mouth daily.     zinc gluconate 50 MG tablet Take 50 mg by mouth daily.     No current facility-administered medications on file prior to visit.    BP (!) 138/90   Pulse 97   Temp 97.8 F (36.6 C) (Oral)   Wt 217 lb (98.4 kg)   LMP 12/14/2012   SpO2 98%   BMI 33.99 kg/m       Objective:   Physical Exam Vitals and nursing note reviewed.  Constitutional:      Appearance: Normal appearance.  Cardiovascular:     Rate and Rhythm: Normal rate and regular rhythm.     Pulses: Normal pulses.     Heart sounds: Normal heart sounds.  Pulmonary:     Effort: Pulmonary effort is normal.  Breath sounds: Normal breath sounds.  Musculoskeletal:        General: Normal range of motion.  Skin:    General: Skin is warm and dry.  Neurological:     General: No focal deficit present.     Mental Status: She is alert and oriented to person, place, and time.  Psychiatric:        Mood and Affect: Mood normal.        Behavior: Behavior normal.        Thought Content: Thought content normal.        Judgment: Judgment normal.        Assessment & Plan:  1. Essential hypertension - Will increase her Norvasc to 10 mg. Follow up in 1 month if BP not at goal  - amLODipine (NORVASC) 10 MG tablet; Take 1 tablet (10 mg total) by mouth daily.  Dispense: 90 tablet; Refill: 0  2. Diabetes mellitus treated with oral medication (HCC) (Primary) - Will resent in Glipizide - Follow up  in 3 months for CPE  - glipiZIDE (GLUCOTROL XL) 2.5 MG 24 hr tablet; Take 1 tablet (2.5 mg total) by mouth daily with breakfast.  Dispense: 90 tablet; Refill: 0 - lisinopril (ZESTRIL) 2.5 MG tablet; Take 1 tablet (2.5 mg total) by mouth daily.  Dispense: 90 tablet; Refill: 0  3. Other migraine without status migrainosus, not intractable  - rizatriptan (MAXALT) 5 MG tablet; Take 1 tablet (5 mg total) by mouth as needed for migraine. May repeat in 2 hours if needed  Dispense: 10 tablet; Refill: 2  4. Chronic asthma without complication, unspecified asthma severity, unspecified whether persistent - needs refill  - albuterol (VENTOLIN HFA) 108 (90 Base) MCG/ACT inhaler; Inhale 1-2 puffs into the lungs every 6 (six) hours as needed for wheezing or shortness of breath.  Dispense: 18 g; Refill: 1  Shirline Frees, NP

## 2023-06-05 ENCOUNTER — Other Ambulatory Visit: Payer: Self-pay | Admitting: Adult Health

## 2023-06-05 ENCOUNTER — Ambulatory Visit: Payer: Medicare HMO | Admitting: Adult Health

## 2023-06-05 ENCOUNTER — Other Ambulatory Visit: Payer: Self-pay

## 2023-06-05 VITALS — BP 110/70 | HR 90 | Temp 98.3°F | Ht 67.75 in | Wt 212.0 lb

## 2023-06-05 DIAGNOSIS — G43809 Other migraine, not intractable, without status migrainosus: Secondary | ICD-10-CM

## 2023-06-05 DIAGNOSIS — Z7984 Long term (current) use of oral hypoglycemic drugs: Secondary | ICD-10-CM

## 2023-06-05 DIAGNOSIS — I10 Essential (primary) hypertension: Secondary | ICD-10-CM | POA: Diagnosis not present

## 2023-06-05 DIAGNOSIS — E119 Type 2 diabetes mellitus without complications: Secondary | ICD-10-CM | POA: Diagnosis not present

## 2023-06-05 DIAGNOSIS — M797 Fibromyalgia: Secondary | ICD-10-CM | POA: Diagnosis not present

## 2023-06-05 DIAGNOSIS — Z0000 Encounter for general adult medical examination without abnormal findings: Secondary | ICD-10-CM

## 2023-06-05 DIAGNOSIS — E66811 Obesity, class 1: Secondary | ICD-10-CM

## 2023-06-05 DIAGNOSIS — Z23 Encounter for immunization: Secondary | ICD-10-CM | POA: Diagnosis not present

## 2023-06-05 DIAGNOSIS — Z720 Tobacco use: Secondary | ICD-10-CM | POA: Diagnosis not present

## 2023-06-05 LAB — LIPID PANEL
Cholesterol: 167 mg/dL (ref 0–200)
HDL: 49.9 mg/dL (ref >39.00)
LDL Cholesterol: 97 mg/dL (ref 0–99)
NonHDL: 116.7
Total CHOL/HDL Ratio: 3
Triglycerides: 98 mg/dL (ref 0.0–149.0)
VLDL: 19.6 mg/dL (ref 0.0–40.0)

## 2023-06-05 LAB — CBC WITH DIFFERENTIAL/PLATELET
Basophils Absolute: 0.1 10*3/uL (ref 0.0–0.1)
Basophils Relative: 1 % (ref 0.0–3.0)
Eosinophils Absolute: 0.1 10*3/uL (ref 0.0–0.7)
Eosinophils Relative: 1 % (ref 0.0–5.0)
HCT: 39.3 % (ref 36.0–46.0)
Hemoglobin: 12.8 g/dL (ref 12.0–15.0)
Lymphocytes Relative: 36.6 % (ref 12.0–46.0)
Lymphs Abs: 3.2 10*3/uL (ref 0.7–4.0)
MCHC: 32.6 g/dL (ref 30.0–36.0)
MCV: 88 fl (ref 78.0–100.0)
Monocytes Absolute: 0.6 10*3/uL (ref 0.1–1.0)
Monocytes Relative: 6.9 % (ref 3.0–12.0)
Neutro Abs: 4.8 10*3/uL (ref 1.4–7.7)
Neutrophils Relative %: 54.5 % (ref 43.0–77.0)
Platelets: 273 10*3/uL (ref 150.0–400.0)
RBC: 4.47 Mil/uL (ref 3.87–5.11)
RDW: 13.4 % (ref 11.5–15.5)
WBC: 8.8 10*3/uL (ref 4.0–10.5)

## 2023-06-05 LAB — MICROALBUMIN / CREATININE URINE RATIO
Creatinine,U: 148.6 mg/dL
Microalb Creat Ratio: 13.1 mg/g (ref 0.0–30.0)
Microalb, Ur: 2 mg/dL — ABNORMAL HIGH (ref 0.0–1.9)

## 2023-06-05 LAB — COMPREHENSIVE METABOLIC PANEL WITH GFR
ALT: 13 U/L (ref 0–35)
AST: 14 U/L (ref 0–37)
Albumin: 4.3 g/dL (ref 3.5–5.2)
Alkaline Phosphatase: 81 U/L (ref 39–117)
BUN: 8 mg/dL (ref 6–23)
CO2: 28 meq/L (ref 19–32)
Calcium: 9.8 mg/dL (ref 8.4–10.5)
Chloride: 104 meq/L (ref 96–112)
Creatinine, Ser: 0.72 mg/dL (ref 0.40–1.20)
GFR: 92.02 mL/min (ref >60.00)
Glucose, Bld: 92 mg/dL (ref 70–99)
Potassium: 3.9 meq/L (ref 3.5–5.1)
Sodium: 140 meq/L (ref 135–145)
Total Bilirubin: 0.4 mg/dL (ref 0.2–1.2)
Total Protein: 7.3 g/dL (ref 6.0–8.3)

## 2023-06-05 LAB — HEMOGLOBIN A1C: Hgb A1c MFr Bld: 6.3 % (ref 4.6–6.5)

## 2023-06-05 LAB — TSH: TSH: 0.58 u[IU]/mL (ref 0.35–5.50)

## 2023-06-05 MED ORDER — ROSUVASTATIN CALCIUM 5 MG PO TABS: 5.0000 mg | ORAL_TABLET | Freq: Every day | ORAL | 3 refills | Status: AC

## 2023-06-05 MED ORDER — AMLODIPINE BESYLATE 10 MG PO TABS: 10.0000 mg | ORAL_TABLET | Freq: Every day | ORAL | 3 refills | Status: AC

## 2023-06-05 MED ORDER — GLIPIZIDE 2.5 MG PO TABS: 2.5000 mg | ORAL_TABLET | Freq: Every day | ORAL | 0 refills | Status: DC

## 2023-06-05 MED ORDER — LISINOPRIL 2.5 MG PO TABS: 2.5000 mg | ORAL_TABLET | Freq: Every day | ORAL | 3 refills | Status: AC

## 2023-06-05 NOTE — Progress Notes (Signed)
 Subjective:    Patient ID: Karen Hardin, female    DOB: 1965/08/17, 58 y.o.   MRN: [id number]  HPI Patient presents for yearly preventative medicine examination. She is a pleasant 58 year old female who  has a past medical history of Allergy, Anemia, Arthritis, Asthma (2002), Depression, Diabetes mellitus without complication (HCC), Fibrocystic breast (2005), Fibromyalgia, GERD (gastroesophageal reflux disease), Hypertension, LGSIL (low grade squamous intraepithelial lesion) on Pap smear (2007), Libido, decreased (2010), Migraines, Neuromuscular disorder (HCC), Ovarian cyst, Stargardt's disease (1986), Tachycardia, Trichomonas, Vaginosis (2008), and Vitamin D  deficiency.  HTN -she is currently managed with Norvasc  10 mg daily and lisinopril  2.5 mg daily. She denies dizziness, lightheadedness, blurred vision or syncope.   BP Readings from Last 3 Encounters:  06/05/23 110/70  03/01/23 (!) 138/90  01/30/23 (!) 158/100   DM Type 2 -managed with glipizide  2.5 mg xr daily. She has had a couple of hypoglycemic episodes over the last month, believes she she waited to long to eat..  Lab Results  Component Value Date   HGBA1C 7.0 (H) 01/30/2023   HGBA1C 6.6 (H) 03/16/2022   HGBA1C 6.8 (H) 10/27/2021   Migraine Headaches -she is currently managed with Maxalt  as needed.  Fibromyalgia -She has tried Cymbalta , lyrica  and Elavil  in the past without improvement. She has not been exercising but plants on getting back into a walking regimen.   Tobacco Use - Continues to smoke, reports " I will stop and then start again, I am not sure why I go back to smoke." She is smoking about 3-4 a day.   All immunizations and health maintenance protocols were reviewed with the patient and needed orders were placed.  Appropriate screening laboratory values were ordered for the patient including screening of hyperlipidemia, renal function and hepatic function.  Medication reconciliation,  past medical history,  social history, problem list and allergies were reviewed in detail with the patient  Goals were established with regard to weight loss, exercise, and  diet in compliance with medications. She has been working on weight loss - she is participating in a weight loss challenge.   Wt Readings from Last 3 Encounters:  06/05/23 212 lb (96.2 kg)  03/01/23 217 lb (98.4 kg)  01/30/23 218 lb 3.2 oz (99 kg)   She is up to date on routine colon cancer screen. She is going to schedule her mammogram.   Review of Systems  Constitutional: Negative.   HENT: Negative.    Eyes: Negative.   Respiratory: Negative.    Cardiovascular: Negative.   Gastrointestinal: Negative.   Endocrine: Negative.   Genitourinary: Negative.   Musculoskeletal:  Positive for arthralgias, back pain and myalgias.  Skin: Negative.   Allergic/Immunologic: Negative.   Neurological:  Positive for headaches.  Hematological: Negative.   Psychiatric/Behavioral: Negative.     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   Asthma 2002   uses inhaler as PRN   Depression    as a child, and stress recently   Diabetes mellitus without complication (HCC)    on meds   Fibrocystic breast 2005   Fibromyalgia    on meds   GERD (gastroesophageal reflux disease)    hx of   Hypertension    on meds   LGSIL (low grade squamous intraepithelial lesion) on Pap smear 2007   Libido,  decreased 2010   Migraines    migraines - otc med prn, then maxalt  if needed   Neuromuscular disorder (HCC)    Ovarian cyst    Stargardt's disease 1986   Patient is legally blind   Tachycardia    per patient diagnosed in ER 2014, no problems currently   Trichomonas    Vaginosis 2008   Vitamin D  deficiency    on meds    Social History   Socioeconomic History   Marital status: Married    Spouse name: Not on file   Number of children: 3    Years of education: Not on file   Highest education level: Some college, no degree  Occupational History   Not on file  Tobacco Use   Smoking status: Some Days    Current packs/day: 0.10    Average packs/day: 0.1 packs/day for 20.7 years (2.1 ttl pk-yrs)    Types: Cigarettes    Start date: 02/28/2015   Smokeless tobacco: Never  Vaping Use   Vaping status: Never Used  Substance and Sexual Activity   Alcohol use: No    Alcohol/week: 0.0 standard drinks of alcohol   Drug use: No   Sexual activity: Yes    Birth control/protection: Surgical, Condom  Other Topics Concern   Not on file  Social History Narrative   Is on disability    Married for 21 years    Three boys ( all three live locally)       She likes to hang out with friends. 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 Right handed   Drinks caffeine   One story home   Social Drivers of Health   Financial Resource Strain: Low Risk  (01/30/2023)   Overall Financial Resource Strain (CARDIA)    Difficulty of Paying Living Expenses: Not very hard  Food Insecurity: No Food Insecurity (01/30/2023)   Hunger Vital Sign    Worried About Running Out of Food in the Last Year: Never true    Ran Out of Food in the Last Year: Never true  Transportation Needs: Unknown (01/30/2023)   PRAPARE - Administrator, Civil Service (Medical): Patient declined    Lack of Transportation (Non-Medical): No  Physical Activity: Inactive (01/30/2023)   Exercise Vital Sign    Days of Exercise per Week: 0 days    Minutes of Exercise per Session: 10 min  Stress: Stress Concern Present (01/30/2023)   [employer] of Occupational Health - Occupational Stress Questionnaire    Feeling of Stress : To some extent  Social Connections: Socially Integrated (01/30/2023)   Social Connection and Isolation Panel [NHANES]    Frequency of Communication with Friends and Family: More than three times a week     Frequency of Social Gatherings with Friends and Family: Three times a week    Attends Religious Services: More than 4 times per year    Active Member of Clubs or Organizations: Yes    Attends Banker Meetings: More than 4 times per year    Marital Status: Married  Catering manager Violence: Not At Risk (09/19/2022)   Humiliation, Afraid, Rape, and Kick questionnaire    Fear of Current or Ex-Partner: No    Emotionally Abused: No    Physically Abused: No    Sexually Abused: No    Past Surgical History:  Procedure Laterality Date   BREAST EXCISIONAL BIOPSY Left    benign   BREAST SURGERY  1986  biopsy - benign   COLONOSCOPY  2016   JMP-MAC-suprep(good)-[facility]   CYSTOSCOPY  12/26/2012   Procedure: CYSTOSCOPY;  Surgeon: Lula Sale, MD;  Location: [facility];  Service: Gynecology;;   DILATION AND CURETTAGE OF UTERUS     ganglion cyst removed from wrist     left   LAPAROSCOPIC ASSISTED VAGINAL HYSTERECTOMY N/A 12/26/2012   Procedure: LAPAROSCOPIC ASSISTED VAGINAL HYSTERECTOMY Uterine Morcellation, Bilateral Salpingectomy, ;  Surgeon: Lula Sale, MD;  Location: [facility];  Service: Gynecology;  Laterality: N/A;   MYOMECTOMY     In [place]  - laparotomy   OOPHORECTOMY  2000   laparotomy in [place]    SHOULDER ARTHROSCOPY Left 2021   SHOULDER ARTHROSCOPY W/ ROTATOR CUFF REPAIR Right 2006   TONSILLECTOMY AND ADENOIDECTOMY     TUBAL LIGATION  2000   WISDOM TOOTH EXTRACTION     x 2 teeth   WISDOM TOOTH EXTRACTION      Family History  Problem Relation Age of Onset   Arthritis Mother    Diabetes Mother    Hypertension Mother    Pulmonary embolism Mother        Cardiac arrest    Diverticulitis Mother    Heart disease Father    Heart attack Father    Mesothelioma Father    Hyperlipidemia Maternal Grandmother    Breast cancer Maternal Grandmother    Colitis Paternal Aunt        in 9's   Stomach cancer Paternal Aunt    Heart attack Paternal Aunt    Heart attack  Paternal Uncle    Stroke Paternal Aunt    Breast cancer Maternal Aunt    Colon polyps Sister 74   Diverticulitis Sister    Colon cancer Neg Hx    Esophageal cancer Neg Hx    Rectal cancer Neg Hx     Allergies  Allergen Reactions   Cholestatin     Current Outpatient Medications on File Prior to Visit  Medication Sig Dispense Refill   lisinopril  (ZESTRIL ) 2.5 MG tablet Take 1 tablet (2.5 mg total) by mouth daily. 90 tablet 0   Multiple Vitamin (MULTIVITAMIN ADULT PO) Take 1 tablet by mouth daily.     rizatriptan  (MAXALT ) 5 MG tablet Take 1 tablet (5 mg total) by mouth as needed for migraine. May repeat in 2 hours if needed 10 tablet 2   albuterol  (VENTOLIN  HFA) 108 (90 Base) MCG/ACT inhaler Inhale 1-2 puffs into the lungs every 6 (six) hours as needed for wheezing or shortness of breath. (Patient not taking: Reported on 06/05/2023) 18 g 1   cyclobenzaprine  (FLEXERIL ) 10 MG tablet Take 1 tablet (10 mg total) by mouth 2 (two) times daily as needed for muscle spasms. (Patient not taking: Reported on 06/05/2023) 20 tablet 0   No current facility-administered medications on file prior to visit.    BP 110/70   Pulse 90   Temp 98.3 F (36.8 C) (Oral)   Ht 5' 7.75" (1.721 m)   Wt 212 lb (96.2 kg)   LMP 12/14/2012   SpO2 99%   BMI 32.47 kg/m       Objective:   Physical Exam Vitals and nursing note reviewed.  Constitutional:      General: She is not in acute distress.    Appearance: Normal appearance. She is obese. She is not ill-appearing.  HENT:     Head: Normocephalic and atraumatic.     Right Ear: Tympanic membrane, ear canal and external ear normal. There is  no impacted cerumen.     Left Ear: Tympanic membrane, ear canal and external ear normal. There is no impacted cerumen.     Nose: Nose normal. No congestion or rhinorrhea.     Mouth/Throat:     Mouth: Mucous membranes are moist.     Pharynx: Oropharynx is clear.  Eyes:     Extraocular Movements: Extraocular movements  intact.     Conjunctiva/sclera: Conjunctivae normal.     Pupils: Pupils are equal, round, and reactive to light.  Neck:     Vascular: No carotid bruit.  Cardiovascular:     Rate and Rhythm: Normal rate and regular rhythm.     Pulses: Normal pulses.     Heart sounds: No murmur heard.    No friction rub. No gallop.  Pulmonary:     Effort: Pulmonary effort is normal.     Breath sounds: Normal breath sounds.  Abdominal:     General: Abdomen is flat. Bowel sounds are normal. There is no distension.     Palpations: Abdomen is soft. There is no mass.     Tenderness: There is no abdominal tenderness. There is no guarding or rebound.     Hernia: No hernia is present.  Musculoskeletal:        General: Tenderness (multiple joint tenderness) present. Normal range of motion.     Cervical back: Normal range of motion and neck supple.  Lymphadenopathy:     Cervical: No cervical adenopathy.  Skin:    General: Skin is warm and dry.     Capillary Refill: Capillary refill takes less than 2 seconds.  Neurological:     General: No focal deficit present.     Mental Status: She is alert and oriented to person, place, and time.  Psychiatric:        Mood and Affect: Mood normal.        Behavior: Behavior normal.        Thought Content: Thought content normal.        Judgment: Judgment normal.       Assessment & Plan:  1. Routine general medical examination at a health care facility (Primary) Today patient counseled on age appropriate routine health concerns for screening and prevention, each reviewed and up to date or declined. Immunizations reviewed and up to date or declined. Labs ordered and reviewed. Risk factors for depression reviewed and negative. Hearing function and visual acuity are intact. ADLs screened and addressed as needed. Functional ability and level of safety reviewed and appropriate. Education, counseling and referrals performed based on assessed risks today. Patient provided with a  copy of personalized plan for preventive services. - Needs to quit smoking  - Increase exercise to help with pain and weight loss  - Follow up in 3 months   2. Essential hypertension - Well controlled. No change in medication  - CBC with Differential/Platelet; Future - Comprehensive metabolic panel with GFR; Future - Lipid panel; Future - TSH; Future - amLODipine  (NORVASC ) 10 MG tablet; Take 1 tablet (10 mg total) by mouth daily.  Dispense: 90 tablet; Refill: 3 - lisinopril  (ZESTRIL ) 2.5 MG tablet; Take 1 tablet (2.5 mg total) by mouth daily.  Dispense: 90 tablet; Refill: 3  3. Diabetes mellitus treated with oral medication (HCC) - Consider increase in Glipizide   - Follow up in 3 months  - CBC with Differential/Platelet; Future - Comprehensive metabolic panel with GFR; Future - Lipid panel; Future - TSH; Future - Hemoglobin A1c; Future - Microalbumin/Creatinine Ratio, Urine;  Future - lisinopril  (ZESTRIL ) 2.5 MG tablet; Take 1 tablet (2.5 mg total) by mouth daily.  Dispense: 90 tablet; Refill: 3  4. Other migraine without status migrainosus, not intractable - Continue Maxalt  as needed   5. Fibromyalgia - Discussed trial of Gabapentin. She would like to wait on this and instead seeing if exercising more helps with her pain   6. Tobacco use - Encouraged to quit smoking   7. Class 1 obesity - Continue to work on weight loss measures   8. Need for shingles vaccine  - Zoster Recombinant (Shingrix  ) - Follow up in 3 months for second shingles vaccination   9. Need for pneumococcal vaccine  - Pneumococcal conjugate vaccine 20-valent (Prevnar 20)   Kaylib Furness, NP

## 2023-06-05 NOTE — Patient Instructions (Signed)
 It was great seeing you today   We will follow up with you regarding your lab work   Please let me know if you need anything

## 2023-07-22 DIAGNOSIS — E119 Type 2 diabetes mellitus without complications: Secondary | ICD-10-CM | POA: Diagnosis not present

## 2023-07-22 DIAGNOSIS — H3553 Other dystrophies primarily involving the sensory retina: Secondary | ICD-10-CM | POA: Diagnosis not present

## 2023-07-22 DIAGNOSIS — H52203 Unspecified astigmatism, bilateral: Secondary | ICD-10-CM | POA: Diagnosis not present

## 2023-07-22 LAB — HM DIABETES EYE EXAM

## 2023-08-28 ENCOUNTER — Other Ambulatory Visit: Payer: Self-pay | Admitting: Adult Health

## 2023-09-04 ENCOUNTER — Ambulatory Visit: Admitting: Adult Health

## 2023-09-04 ENCOUNTER — Encounter: Payer: Self-pay | Admitting: Adult Health

## 2023-09-04 VITALS — BP 110/70 | HR 78 | Temp 98.0°F | Ht 67.75 in | Wt 215.0 lb

## 2023-09-04 DIAGNOSIS — Z23 Encounter for immunization: Secondary | ICD-10-CM

## 2023-09-04 DIAGNOSIS — Z7984 Long term (current) use of oral hypoglycemic drugs: Secondary | ICD-10-CM | POA: Diagnosis not present

## 2023-09-04 DIAGNOSIS — I10 Essential (primary) hypertension: Secondary | ICD-10-CM | POA: Diagnosis not present

## 2023-09-04 DIAGNOSIS — E119 Type 2 diabetes mellitus without complications: Secondary | ICD-10-CM

## 2023-09-04 LAB — POCT GLYCOSYLATED HEMOGLOBIN (HGB A1C): Hemoglobin A1C: 6.3 % — AB (ref 4.0–5.6)

## 2023-09-04 NOTE — Progress Notes (Signed)
 Subjective:    Patient ID: Karen Hardin, female    DOB: 10-25-1965, 58 y.o.   MRN: [id number]  HPI 58 year old female who  has a past medical history of Allergy, Anemia, Arthritis, Asthma (2002), Depression, Diabetes mellitus without complication (HCC), Fibrocystic breast (2005), Fibromyalgia, GERD (gastroesophageal reflux disease), Hypertension, LGSIL (low grade squamous intraepithelial lesion) on Pap smear (2007), Libido, decreased (2010), Migraines, Neuromuscular disorder (HCC), Ovarian cyst, Stargardt's disease (1986), Tachycardia, Trichomonas, Vaginosis (2008), and Vitamin D  deficiency.  She presents to the office today for follow up regarding DM and HTN   DM Type 2 -managed with glipizide  2.5 mg xr daily. She has not been checking her blood sugars on a regular basis.  Lab Results  Component Value Date   HGBA1C 6.3 06/05/2023   HGBA1C 7.0 (H) 01/30/2023   HGBA1C 6.6 (H) 03/16/2022   Wt Readings from Last 3 Encounters:  09/04/23 215 lb (97.5 kg)  06/05/23 212 lb (96.2 kg)  03/01/23 217 lb (98.4 kg)   HTN -she is currently managed with Norvasc  10 mg daily and lisinopril  2.5 mg daily. She denies dizziness, lightheadedness, blurred vision or syncope.  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   Asthma 2002   uses inhaler as PRN   Depression    as a child, and stress recently   Diabetes mellitus without complication (HCC)    on meds   Fibrocystic breast 2005   Fibromyalgia    on meds   GERD (gastroesophageal reflux disease)    hx of   Hypertension    on meds   LGSIL (low grade squamous intraepithelial lesion) on Pap smear 2007   Libido, decreased 2010   Migraines    migraines - otc med prn, then maxalt  if needed    Neuromuscular disorder (HCC)    Ovarian cyst    Stargardt's disease 1986   Patient is legally blind   Tachycardia    per patient diagnosed in ER 2014, no problems currently   Trichomonas    Vaginosis 2008   Vitamin D  deficiency    on meds    Social History   Socioeconomic History   Marital status: Married    Spouse name: Not on file   Number of children: 3   Years of education: Not on file   Highest education level: Some college, no degree  Occupational History   Not on file  Tobacco Use   Smoking status: Some Days    Current packs/day: 0.10    Average packs/day: 0.1 packs/day for 20.9 years (2.1 ttl pk-yrs)    Types: Cigarettes    Start date: 02/28/2015   Smokeless tobacco: Never  Vaping Use   Vaping status: Never Used  Substance and Sexual Activity   Alcohol use: No    Alcohol/week: 0.0 standard drinks of alcohol   Drug use: No   Sexual activity: Yes    Birth control/protection: Surgical, Condom  Other Topics Concern   Not on file  Social History Narrative   Is on disability    Married for 21 years    Three boys ( all three live locally)  She likes to hang out with friends. 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 Right handed   Drinks caffeine   One story home   Social Drivers of Health   Financial Resource Strain: Patient Declined (09/03/2023)   Overall Financial Resource Strain (CARDIA)    Difficulty of Paying Living Expenses: Patient declined  Food Insecurity: Patient Declined (09/03/2023)   Hunger Vital Sign    Worried About Running Out of Food in the Last Year: Patient declined    Ran Out of Food in the Last Year: Patient declined  Transportation Needs: Patient Declined (09/03/2023)   PRAPARE - Administrator, Civil Service (Medical): Patient declined    Lack of Transportation (Non-Medical): Patient declined  Physical Activity: Unknown (09/03/2023)   Exercise Vital Sign    Days of Exercise per Week:  Patient declined    Minutes of Exercise per Session: Not on file  Stress: Patient Declined (09/03/2023)   [employer] of Occupational Health - Occupational Stress Questionnaire    Feeling of Stress: Patient declined  Social Connections: Unknown (09/03/2023)   Social Connection and Isolation Panel    Frequency of Communication with Friends and Family: Patient declined    Frequency of Social Gatherings with Friends and Family: Patient declined    Attends Religious Services: Patient declined    Active Member of Clubs or Organizations: Patient declined    Attends Banker Meetings: Not on file    Marital Status: Married  Intimate Partner Violence: Not At Risk (09/19/2022)   Humiliation, Afraid, Rape, and Kick questionnaire    Fear of Current or Ex-Partner: No    Emotionally Abused: No    Physically Abused: No    Sexually Abused: No    Past Surgical History:  Procedure Laterality Date   BREAST EXCISIONAL BIOPSY Left    benign   BREAST SURGERY  1986   biopsy - benign   COLONOSCOPY  2016   JMP-MAC-suprep(good)-[facility]   CYSTOSCOPY  12/26/2012   Procedure: CYSTOSCOPY;  Surgeon: Rome Rigg, MD;  Location: [facility];  Service: Gynecology;;   DILATION AND CURETTAGE OF UTERUS     ganglion cyst removed from wrist     left   LAPAROSCOPIC ASSISTED VAGINAL HYSTERECTOMY N/A 12/26/2012   Procedure: LAPAROSCOPIC ASSISTED VAGINAL HYSTERECTOMY Uterine Morcellation, Bilateral Salpingectomy, ;  Surgeon: Rome Rigg, MD;  Location: [facility];  Service: Gynecology;  Laterality: N/A;   MYOMECTOMY     In [place]  - laparotomy   OOPHORECTOMY  2000   laparotomy in [place]    SHOULDER ARTHROSCOPY Left 2021   SHOULDER ARTHROSCOPY W/ ROTATOR CUFF REPAIR Right 2006   TONSILLECTOMY AND ADENOIDECTOMY     TUBAL LIGATION  2000   WISDOM TOOTH EXTRACTION     x 2 teeth   WISDOM TOOTH EXTRACTION      Family History  Problem Relation Age of Onset   Arthritis Mother    Diabetes Mother     Hypertension Mother    Pulmonary embolism Mother        Cardiac arrest    Diverticulitis Mother    Heart disease Father    Heart attack Father    Mesothelioma Father    Hyperlipidemia Maternal Grandmother    Breast cancer Maternal Grandmother    Colitis Paternal Aunt        in 77's   Stomach cancer Paternal Aunt    Heart attack Paternal Aunt    Heart attack Paternal Uncle  Stroke Paternal Aunt    Breast cancer Maternal Aunt    Colon polyps Sister 6   Diverticulitis Sister    Colon cancer Neg Hx    Esophageal cancer Neg Hx    Rectal cancer Neg Hx     Allergies  Allergen Reactions   Cholestatin     Current Outpatient Medications on File Prior to Visit  Medication Sig Dispense Refill   albuterol  (VENTOLIN  HFA) 108 (90 Base) MCG/ACT inhaler Inhale 1-2 puffs into the lungs every 6 (six) hours as needed for wheezing or shortness of breath. 18 g 1   amLODipine  (NORVASC ) 10 MG tablet Take 1 tablet (10 mg total) by mouth daily. 90 tablet 3   cyclobenzaprine  (FLEXERIL ) 10 MG tablet Take 1 tablet (10 mg total) by mouth 2 (two) times daily as needed for muscle spasms. 20 tablet 0   glipiZIDE  2.5 MG TABS Take 1 tablet by mouth once daily 90 tablet 0   lisinopril  (ZESTRIL ) 2.5 MG tablet Take 1 tablet (2.5 mg total) by mouth daily. 90 tablet 3   Multiple Vitamin (MULTIVITAMIN ADULT PO) Take 1 tablet by mouth daily.     rizatriptan  (MAXALT ) 5 MG tablet Take 1 tablet (5 mg total) by mouth as needed for migraine. May repeat in 2 hours if needed 10 tablet 2   rosuvastatin  (CRESTOR ) 5 MG tablet Take 1 tablet (5 mg total) by mouth daily. 90 tablet 3   No current facility-administered medications on file prior to visit.    BP 110/70   Pulse 78   Temp 98 F (36.7 C) (Oral)   Ht 5' 7.75 (1.721 m)   Wt 215 lb (97.5 kg)   LMP 12/14/2012   SpO2 97%   BMI 32.93 kg/m       Objective:   Physical Exam Vitals and nursing note reviewed.  Constitutional:      Appearance: Normal  appearance. She is obese.  Cardiovascular:     Rate and Rhythm: Normal rate and regular rhythm.     Pulses: Normal pulses.     Heart sounds: Normal heart sounds.  Pulmonary:     Effort: Pulmonary effort is normal.     Breath sounds: Normal breath sounds.  Skin:    General: Skin is warm and dry.  Neurological:     General: No focal deficit present.     Mental Status: She is alert and oriented to person, place, and time.  Psychiatric:        Mood and Affect: Mood normal.        Behavior: Behavior normal.        Thought Content: Thought content normal.        Judgment: Judgment normal.       Assessment & Plan:  1. Diabetes mellitus treated with oral medication (HCC) (Primary)  - POC HgB A1c- 6.3 - at goal  - Encouraged formal exercise - Follow up in one year or sooner if needed  2. Essential hypertension - Well controlled. No change in medication   3. Need for shingles vaccine  - Varicella-zoster vaccine IM

## 2023-09-25 ENCOUNTER — Telehealth: Payer: Self-pay

## 2023-09-25 NOTE — Telephone Encounter (Signed)
 Unsuccessful attempts to reach patient on preferred number listed in notes for scheduled AWV. Left message on voicemail okay to reschedule.

## 2023-10-10 ENCOUNTER — Ambulatory Visit (HOSPITAL_COMMUNITY)
Admission: EM | Admit: 2023-10-10 | Discharge: 2023-10-10 | Disposition: A | Discharge status: Home / Self Care | Attending: Emergency Medicine | Admitting: Emergency Medicine

## 2023-10-10 ENCOUNTER — Encounter (HOSPITAL_COMMUNITY): Payer: Self-pay

## 2023-10-10 DIAGNOSIS — M545 Low back pain, unspecified: Secondary | ICD-10-CM | POA: Diagnosis not present

## 2023-10-10 MED ORDER — METHYLPREDNISOLONE SODIUM SUCC 125 MG IJ SOLR
INTRAMUSCULAR | Status: AC
  Filled 2023-10-10: qty 2

## 2023-10-10 MED ORDER — CYCLOBENZAPRINE HCL 10 MG PO TABS: 10.0000 mg | ORAL_TABLET | Freq: Every evening | ORAL | 0 refills | Status: AC

## 2023-10-10 MED ORDER — METHYLPREDNISOLONE SODIUM SUCC 125 MG IJ SOLR
60.0000 mg | Freq: Once | INTRAMUSCULAR | Status: AC
  Administered 2023-10-10: 60 mg via INTRAMUSCULAR

## 2023-10-10 NOTE — Discharge Instructions (Addendum)
 The steroid injection can start to work in about 30 minutes for pain.  You can take the muscle relaxer Flexeril  at bedtime as this makes you drowsy  Apply ice or hot pad, and topical therapies such as icyhot or biofreeze  Please call your orthopedic specialist to make an appointment for follow-up

## 2023-10-10 NOTE — ED Triage Notes (Signed)
 Patient c/o left lower back pain x 2 days. Patient states she bent over to put a blanket on her grand daughter and felt something in her back. Patient denies radiation of pain in the buttock or leg, but states the pain radiates into the left groin area.  Patient states she has been using ice to her back and taking Ibuprofen .

## 2023-10-10 NOTE — ED Provider Notes (Signed)
 [facility]    CSN: [id number] Arrival date & time: 10/10/23  1356      History   Chief Complaint Chief Complaint  Patient presents with   Back Pain    HPI Karen Hardin is a 58 y.o. female.  Here with 2 day history of left low back pain Started as she was throwing a blanket, bending forward. Felt something pull in her back. Pain does not radiate into the legs. Denies saddle anesthesia, bladder or bowel dysfunction  Applied ice and took ibuprofen . Last dose 10 am today (4 hours PTA)  History of degenerative disc disease, lumbar radiculopathy and spondylosis,, chronic low back pain, sciatica  Also has fibromyalgia  She [facility] for her [facility] pain  Hx T2DM. Last A1c 6.3 No CKD history. 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   Asthma 2002   uses inhaler as PRN   Depression    as a child, and stress recently   Diabetes mellitus without complication (HCC)    on meds   Fibrocystic breast 2005   Fibromyalgia    on meds   GERD (gastroesophageal reflux disease)    hx of   Hypertension    on meds   LGSIL (low grade squamous intraepithelial lesion) on Pap smear 2007   Libido, decreased 2010   Migraines    migraines - otc med prn, then maxalt  if needed   Neuromuscular disorder (HCC)    Ovarian cyst    Stargardt's disease 1986   Patient is legally blind   Tachycardia    per patient diagnosed in ER 2014, no problems currently   Trichomonas    Vaginosis 2008   Vitamin D  deficiency    on meds    Patient Active Problem List   Diagnosis Date Noted   Vitamin D  deficiency 08/06/2018   Vitamin B 12 deficiency 08/06/2018   Iron deficiency anemia 08/06/2018   Type 2 diabetes mellitus with hyperglycemia (HCC) 09/22/2015   Asthma, chronic 05/11/2014   Legal blindness 05/11/2014    Chronic back pain 05/11/2014   Migraines 05/11/2014   Stargardt's disease    Menorrhagia 09/11/2011    Past Surgical History:  Procedure Laterality Date   BREAST EXCISIONAL BIOPSY Left    benign   BREAST SURGERY  1986   biopsy - benign   COLONOSCOPY  2016   JMP-MAC-suprep(good)-SSP   CYSTOSCOPY  12/26/2012   Procedure: CYSTOSCOPY;  Surgeon: Rome Rigg, MD;  Location: [facility];  Service: Gynecology;;   DILATION AND CURETTAGE OF UTERUS     ganglion cyst removed from wrist     left   LAPAROSCOPIC ASSISTED VAGINAL HYSTERECTOMY N/A 12/26/2012   Procedure: LAPAROSCOPIC ASSISTED VAGINAL HYSTERECTOMY Uterine Morcellation, Bilateral Salpingectomy, ;  Surgeon: Rome Rigg, MD;  Location: [facility];  Service: Gynecology;  Laterality: N/A;   MYOMECTOMY     In [place]  - laparotomy   OOPHORECTOMY  2000   laparotomy in [place]    SHOULDER ARTHROSCOPY Left 2021   SHOULDER ARTHROSCOPY W/ ROTATOR CUFF REPAIR Right 2006   TONSILLECTOMY AND ADENOIDECTOMY     TUBAL LIGATION  2000   WISDOM TOOTH EXTRACTION     x 2 teeth   WISDOM TOOTH EXTRACTION      OB History  Gravida  3   Para  3   Term  3   Preterm      AB      Living  3      SAB      IAB      Ectopic      Multiple      Live Births  3            Home Medications    Prior to Admission medications   Medication Sig Start Date End Date Taking? Authorizing Provider  cyclobenzaprine  (FLEXERIL ) 10 MG tablet Take 1 tablet (10 mg total) by mouth at bedtime. 10/10/23  Yes Lovis More, [place], PA-C  albuterol  (VENTOLIN  HFA) 108 (90 Base) MCG/ACT inhaler Inhale 1-2 puffs into the lungs every 6 (six) hours as needed for wheezing or shortness of breath. 03/01/23   Nafziger, Darleene, NP  amLODipine  (NORVASC ) 10 MG tablet Take 1 tablet (10 mg total) by mouth daily. 06/05/23   Nafziger, Darleene, NP  glipiZIDE  2.5 MG TABS Take 1 tablet by mouth once daily 08/28/23   Nafziger, Darleene, NP  lisinopril  (ZESTRIL ) 2.5 MG tablet Take 1  tablet (2.5 mg total) by mouth daily. 06/05/23   Nafziger, Darleene, NP  Multiple Vitamin (MULTIVITAMIN ADULT PO) Take 1 tablet by mouth daily.    [provider]  rizatriptan  (MAXALT ) 5 MG tablet Take 1 tablet (5 mg total) by mouth as needed for migraine. May repeat in 2 hours if needed 03/01/23   Nafziger, Cory, NP  rosuvastatin  (CRESTOR ) 5 MG tablet Take 1 tablet (5 mg total) by mouth daily. 06/05/23   Nafziger, Darleene, NP    Family History Family History  Problem Relation Age of Onset   Arthritis Mother    Diabetes Mother    Hypertension Mother    Pulmonary embolism Mother        Cardiac arrest    Diverticulitis Mother    Heart disease Father    Heart attack Father    Mesothelioma Father    Hyperlipidemia Maternal Grandmother    Breast cancer Maternal Grandmother    Colitis Paternal Aunt        in 65's   Stomach cancer Paternal Aunt    Heart attack Paternal Aunt    Heart attack Paternal Uncle    Stroke Paternal Aunt    Breast cancer Maternal Aunt    Colon polyps Sister 60   Diverticulitis Sister    Colon cancer Neg Hx    Esophageal cancer Neg Hx    Rectal cancer Neg Hx     Social History Social History   Tobacco Use   Smoking status: Some Days    Current packs/day: 0.10    Average packs/day: 0.1 packs/day for 21.0 years (2.1 ttl pk-yrs)    Types: Cigarettes    Start date: 02/28/2015   Smokeless tobacco: Never  Vaping Use   Vaping status: Never Used  Substance Use Topics   Alcohol use: No    Alcohol/week: 0.0 standard drinks of alcohol   Drug use: No     Allergies   Cholestatin   Review of Systems Review of Systems  As per HPI  Physical Exam Triage Vital Signs ED Triage Vitals  Encounter Vitals Group     BP      Girls Systolic BP Percentile      Girls Diastolic BP Percentile      Boys Systolic BP Percentile      Boys Diastolic BP Percentile  Pulse      Resp      Temp      Temp src      SpO2      Weight      Height      Head  Circumference      Peak Flow      Pain Score      Pain Loc      Pain Education      Exclude from Growth Chart    No data found.  Updated Vital Signs BP 123/83 (BP Location: Left Arm)   Pulse 72   Temp 98.6 F (37 C) (Oral)   Resp 14   LMP 12/14/2012   SpO2 96%    Physical Exam Vitals and nursing note reviewed.  Constitutional:      General: She is not in acute distress. HENT:     Mouth/Throat:     Pharynx: Oropharynx is clear.  Cardiovascular:     Rate and Rhythm: Normal rate and regular rhythm.     Pulses: Normal pulses.     Heart sounds: Normal heart sounds.  Pulmonary:     Effort: Pulmonary effort is normal.     Breath sounds: Normal breath sounds.  Musculoskeletal:        General: Tenderness present.     Cervical back: Normal range of motion.     Comments: Left low back muscular tenderness and tightness. No bony tenderness of spine   Skin:    General: Skin is warm and dry.     Capillary Refill: Capillary refill takes less than 2 seconds.  Neurological:     General: No focal deficit present.     Mental Status: She is alert and oriented to person, place, and time.     Cranial Nerves: No cranial nerve deficit.     Sensory: Sensation is intact.     Motor: Motor function is intact.     Coordination: Coordination is intact.     Gait: Gait is intact.     Comments: Strength and sensation equal, intact     UC Treatments / Results  Labs (all labs ordered are listed, but only abnormal results are displayed) Labs Reviewed - No data to display  EKG  Radiology No results found.  Procedures Procedures (including critical care time)  Medications Ordered in UC Medications  methylPREDNISolone  sodium succinate (SOLU-MEDROL ) 125 mg/2 mL injection 60 mg (60 mg Intramuscular Given 10/10/23 1515)    Initial Impression / Assessment and Plan / UC Course  I have reviewed the triage vital signs and the nursing notes.  Pertinent labs & imaging results that were  available during my care of the patient were reviewed by me and considered in my medical decision making (see chart for details).  Acute on chronic low back pain Stable vitals, neurologically intact IM solumedrol given in clinic. Avoided toradol  as patient took 800 mg ibuprofen  4 hours ago. Has used flexeril  in the past, works but makes her drowsy. Sent with precautions. Other supportive care at home. Follow up with orthopedics. Agrees to plan, no questions   Final Clinical Impressions(s) / UC Diagnoses   Final diagnoses:  Acute left-sided low back pain without sciatica     Discharge Instructions      The steroid injection can start to work in about 30 minutes for pain.  You can take the muscle relaxer Flexeril  at bedtime as this makes you drowsy  Apply ice or hot pad, and topical therapies such as  icyhot or biofreeze  Please call your orthopedic specialist to make an appointment for follow-up     ED Prescriptions     Medication Sig Dispense Auth. Provider   cyclobenzaprine  (FLEXERIL ) 10 MG tablet Take 1 tablet (10 mg total) by mouth at bedtime. 20 tablet Tytus Strahle, [place], PA-C      PDMP not reviewed this encounter.   Jamilex Bohnsack, [place], [place] 10/10/23 1520

## 2023-10-22 DIAGNOSIS — M545 Low back pain, unspecified: Secondary | ICD-10-CM | POA: Diagnosis not present

## 2023-10-24 DIAGNOSIS — H5310 Unspecified subjective visual disturbances: Secondary | ICD-10-CM | POA: Diagnosis not present

## 2023-10-24 DIAGNOSIS — H31002 Unspecified chorioretinal scars, left eye: Secondary | ICD-10-CM | POA: Diagnosis not present

## 2023-10-24 DIAGNOSIS — G43909 Migraine, unspecified, not intractable, without status migrainosus: Secondary | ICD-10-CM | POA: Diagnosis not present

## 2023-10-24 DIAGNOSIS — H43812 Vitreous degeneration, left eye: Secondary | ICD-10-CM | POA: Diagnosis not present

## 2023-11-07 DIAGNOSIS — G43909 Migraine, unspecified, not intractable, without status migrainosus: Secondary | ICD-10-CM | POA: Diagnosis not present

## 2023-11-07 DIAGNOSIS — H31003 Unspecified chorioretinal scars, bilateral: Secondary | ICD-10-CM | POA: Diagnosis not present

## 2023-11-07 DIAGNOSIS — H5310 Unspecified subjective visual disturbances: Secondary | ICD-10-CM | POA: Diagnosis not present

## 2023-11-12 ENCOUNTER — Ambulatory Visit: Admitting: Physical Therapy

## 2023-11-12 NOTE — Therapy (Incomplete)
 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   Asthma 2002   uses inhaler as PRN   Depression    as a child, and stress recently   Diabetes mellitus without complication (HCC)    on meds   Fibrocystic breast 2005   Fibromyalgia    on meds   GERD (gastroesophageal reflux disease)    hx of   Hypertension    on meds   LGSIL (low grade squamous intraepithelial lesion) on Pap smear 2007   Libido, decreased 2010   Migraines    migraines - otc med prn, then maxalt  if needed   Neuromuscular disorder (HCC)    Ovarian cyst    Stargardt's disease 1986   Patient is legally blind   Tachycardia    per patient diagnosed in ER 2014, no problems currently   Trichomonas    Vaginosis 2008   Vitamin D  deficiency    on meds   Past Surgical History:  Procedure Laterality Date   BREAST EXCISIONAL BIOPSY Left    benign   BREAST SURGERY  1986   biopsy - benign   COLONOSCOPY  2016   JMP-MAC-suprep(good)-SSP   CYSTOSCOPY  12/26/2012   Procedure: CYSTOSCOPY;  Surgeon: Rome Rigg, MD;  Location: [facility];  Service: Gynecology;;   DILATION AND CURETTAGE OF UTERUS     ganglion cyst removed from wrist     left   LAPAROSCOPIC ASSISTED VAGINAL HYSTERECTOMY N/A 12/26/2012   Procedure: LAPAROSCOPIC ASSISTED VAGINAL HYSTERECTOMY Uterine Morcellation, Bilateral Salpingectomy, ;  Surgeon: Rome Rigg, MD;  Location: [facility];  Service: Gynecology;  Laterality: N/A;   MYOMECTOMY     In [place]  - laparotomy   OOPHORECTOMY  2000   laparotomy in [place]    SHOULDER ARTHROSCOPY Left 2021   SHOULDER ARTHROSCOPY W/ ROTATOR CUFF REPAIR Right 2006   TONSILLECTOMY AND ADENOIDECTOMY     TUBAL LIGATION   2000   WISDOM TOOTH EXTRACTION     x 2 teeth   WISDOM TOOTH EXTRACTION     Patient Active Problem List   Diagnosis Date Noted   Vitamin D  deficiency 08/06/2018   Vitamin B 12 deficiency 08/06/2018   Iron deficiency anemia 08/06/2018   Type 2 diabetes mellitus with hyperglycemia (HCC) 09/22/2015   Asthma, chronic 05/11/2014   Legal blindness 05/11/2014   Chronic back pain 05/11/2014   Migraines 05/11/2014   Stargardt's disease    Menorrhagia 09/11/2011    PCP: ***  REFERRING PROVIDER: Faye Lauraine PARAS, FNP  REFERRING DIAG: M54.50 (ICD-10-CM) - Low back pain, unspecified  Rationale for Evaluation and Treatment: Rehabilitation  THERAPY DIAG:  No diagnosis found.  ONSET DATE: 10/22/2023 (referral date)  SUBJECTIVE:  SUBJECTIVE STATEMENT: ***  PERTINENT HISTORY:  ***  PAIN:  Are you having pain? {OPRCPAIN:27236}  PRECAUTIONS: {Therapy precautions:24002}  RED FLAGS: {PT Red Flags:29287}   WEIGHT BEARING RESTRICTIONS: {Yes ***/No:24003}  FALLS:  Has patient fallen in last 6 months? {fallsyesno:27318}  LIVING ENVIRONMENT: Lives with: {OPRC lives with:25569::lives with their family} Lives in: {Lives in:25570} Stairs: {opstairs:27293} Has following equipment at home: {Assistive devices:23999}  OCCUPATION: ***  PLOF: {PLOF:24004}  PATIENT GOALS: ***  NEXT MD VISIT: ***  OBJECTIVE:  Note: Objective measures were completed at Evaluation unless otherwise noted.  DIAGNOSTIC FINDINGS:  *** Per referring provider note: 2V lumbar AP and lateral, done in our office today and reviewed by me. Reviewed with the patient. Normal alignment. Lumbar lordosis is maintained. Lumbar disc space is maintained. Lumbar facet arthrosis is the biggest finding on imaging today, noting this at  L4-5 and L5-S1. No compression fractures noted.  PATIENT SURVEYS:  {rehab surveys:24030}  COGNITION: Overall cognitive status: {cognition:24006}     SENSATION: {sensation:27233}  MUSCLE LENGTH: Hamstrings: Right *** deg; Left *** deg Debby test: Right *** deg; Left *** deg  POSTURE: {posture:25561}  PALPATION: ***  LUMBAR ROM:   AROM eval  Flexion   Extension   Right lateral flexion   Left lateral flexion   Right rotation   Left rotation    (Blank rows = not tested)  LOWER EXTREMITY ROM:     {AROM/PROM:27142}  Right eval Left eval  Hip flexion    Hip extension    Hip abduction    Hip adduction    Hip internal rotation    Hip external rotation    Knee flexion    Knee extension    Ankle dorsiflexion    Ankle plantarflexion    Ankle inversion    Ankle eversion     (Blank rows = not tested)  LOWER EXTREMITY MMT:    MMT Right eval Left eval  Hip flexion    Hip extension    Hip abduction    Hip adduction    Hip internal rotation    Hip external rotation    Knee flexion    Knee extension    Ankle dorsiflexion    Ankle plantarflexion    Ankle inversion    Ankle eversion     (Blank rows = not tested)  LUMBAR SPECIAL TESTS:  {lumbar special test:25242}  FUNCTIONAL TESTS:  {Functional tests:24029}  GAIT: Distance walked: *** Assistive device utilized: {Assistive devices:23999} Level of assistance: {Levels of assistance:24026} Comments: ***  TREATMENT DATE: ***   PT Evaluation                                                                                                                              PATIENT EDUCATION:  Education details: *** Person educated: {Person educated:25204} Education method: {Education Method:25205} Education comprehension: {Education Comprehension:25206}  HOME EXERCISE PROGRAM: ***  ASSESSMENT:  CLINICAL IMPRESSION: Patient is a *** y.o. *** who was  seen today for physical therapy evaluation and treatment  for ***.   OBJECTIVE IMPAIRMENTS: {opptimpairments:25111}.   ACTIVITY LIMITATIONS: {activitylimitations:27494}  PARTICIPATION LIMITATIONS: {participationrestrictions:25113}  PERSONAL FACTORS: {Personal factors:25162} are also affecting patient's functional outcome.   REHAB POTENTIAL: {rehabpotential:25112}  CLINICAL DECISION MAKING: {clinical decision making:25114}  EVALUATION COMPLEXITY: {Evaluation complexity:25115}   GOALS: Goals reviewed with patient? {yes/no:20286}  SHORT TERM GOALS: Target date: ***  *** Baseline: Goal status: INITIAL  2.  *** Baseline:  Goal status: INITIAL  3.  *** Baseline:  Goal status: INITIAL  4.  *** Baseline:  Goal status: INITIAL  5.  *** Baseline:  Goal status: INITIAL  6.  *** Baseline:  Goal status: INITIAL  LONG TERM GOALS: Target date: ***  *** Baseline:  Goal status: INITIAL  2.  *** Baseline:  Goal status: INITIAL  3.  *** Baseline:  Goal status: INITIAL  4.  *** Baseline:  Goal status: INITIAL  5.  *** Baseline:  Goal status: INITIAL  6.  *** Baseline:  Goal status: INITIAL  PLAN:  PT FREQUENCY: {rehab frequency:25116}  PT DURATION: {rehab duration:25117}  PLANNED INTERVENTIONS: {rehab planned interventions:25118::97110-Therapeutic exercises,97530- Therapeutic [phone]- Neuromuscular re-education,97535- Self Rjmz,02859- Manual therapy,Patient/Family education}.  PLAN FOR NEXT SESSION: ***  [employer]***   Waddell Southgate, PT Waddell Southgate, PT, DPT, CSRS  11/12/2023, 7:31 AM

## 2023-11-19 ENCOUNTER — Ambulatory Visit: Attending: Family Medicine | Admitting: Physical Therapy

## 2023-11-19 DIAGNOSIS — M6281 Muscle weakness (generalized): Secondary | ICD-10-CM | POA: Insufficient documentation

## 2023-11-19 DIAGNOSIS — M5459 Other low back pain: Secondary | ICD-10-CM | POA: Insufficient documentation

## 2023-11-19 NOTE — Therapy (Addendum)
 OUTPATIENT PHYSICAL THERAPY THORACOLUMBAR EVALUATION   Patient Name: Karen Hardin MRN: [id number] DOB:08-28-1965, 58 y.o., female Today's Date: 11/19/2023  END OF SESSION:  PT End of Session - 11/19/23 0932     Visit Number 1    Number of Visits 7   with eval   Date for Recertification  01/14/24    Authorization Type [employer]    PT Start Time 0932    PT Stop Time 1010    PT Time Calculation (min) 38 min    Activity Tolerance Patient tolerated treatment well    Behavior During Therapy [facility] for tasks assessed/performed          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   Asthma 2002   uses inhaler as PRN   Depression    as a child, and stress recently   Diabetes mellitus without complication (HCC)    on meds   Fibrocystic breast 2005   Fibromyalgia    on meds   GERD (gastroesophageal reflux disease)    hx of   Hypertension    on meds   LGSIL (low grade squamous intraepithelial lesion) on Pap smear 2007   Libido, decreased 2010   Migraines    migraines - otc med prn, then maxalt  if needed   Neuromuscular disorder (HCC)    Ovarian cyst    Stargardt's disease 1986   Patient is legally blind   Tachycardia    per patient diagnosed in ER 2014, no problems currently   Trichomonas    Vaginosis 2008   Vitamin D  deficiency    on meds   Past Surgical History:  Procedure Laterality Date   BREAST EXCISIONAL BIOPSY Left    benign   BREAST SURGERY  1986   biopsy - benign   COLONOSCOPY  2016   JMP-MAC-suprep(good)-SSP   CYSTOSCOPY  12/26/2012   Procedure: CYSTOSCOPY;  Surgeon: Rome Rigg, MD;  Location: [facility];  Service: Gynecology;;   DILATION AND CURETTAGE OF UTERUS     ganglion cyst removed from wrist     left   LAPAROSCOPIC ASSISTED VAGINAL HYSTERECTOMY N/A 12/26/2012   Procedure: LAPAROSCOPIC ASSISTED VAGINAL HYSTERECTOMY  Uterine Morcellation, Bilateral Salpingectomy, ;  Surgeon: Rome Rigg, MD;  Location: [facility];  Service: Gynecology;  Laterality: N/A;   MYOMECTOMY     In [place]  - laparotomy   OOPHORECTOMY  2000   laparotomy in [place]    SHOULDER ARTHROSCOPY Left 2021   SHOULDER ARTHROSCOPY W/ ROTATOR CUFF REPAIR Right 2006   TONSILLECTOMY AND ADENOIDECTOMY     TUBAL LIGATION  2000   WISDOM TOOTH EXTRACTION     x 2 teeth   WISDOM TOOTH EXTRACTION     Patient Active Problem List   Diagnosis Date Noted   Vitamin D  deficiency 08/06/2018   Vitamin B 12 deficiency 08/06/2018   Iron deficiency anemia 08/06/2018   Type 2 diabetes mellitus with hyperglycemia (HCC) 09/22/2015   Asthma, chronic 05/11/2014   Legal blindness 05/11/2014   Chronic back pain 05/11/2014   Migraines 05/11/2014   Stargardt's disease    Menorrhagia 09/11/2011    PCP: Merna Huxley, NP  REFERRING PROVIDER: Faye Lauraine PARAS, FNP  REFERRING DIAG: M54.50 (ICD-10-CM) - Low back pain, unspecified  Rationale for Evaluation and Treatment: Rehabilitation  THERAPY DIAG:  Muscle weakness (generalized)  Other low back pain  ONSET DATE: 10/22/2023 (referral date)  SUBJECTIVE:                                                                                                                                                                                           SUBJECTIVE STATEMENT: Karen Hardin  Pt reports that she has been having chronic back pain for years, she was supposed to see PT last year but never did. She returned to see her ortho doctor recently and was referred to PT again. She recounts having a difficult year due to the loss of her mom resulting in loss of motivation and decreased activity level. Additionally, her husband Ozell had a stroke recently and is also being seen at this clinic for PT and OT. Pt reports that she has not been very physically active and is looking to get established with an HEP that she  can continue to be compliant with after d/c from PT.  Pt reports that her pain gets worse when standing for too long. She has had sciatic issues in the past but currently does not have any pain or nerve symptoms going down her legs. She also reports having fibromyalgia which contributes to her pain.   PERTINENT HISTORY:  PMH: fibromyalgia, B shoulder surgery, chronic LBP, anemia, DM, asthma, migraines, Stargardt's disease (legally blind), GERD, HTN  PAIN:  Are you having pain? Yes: NPRS scale: 7-8/10, 10/10 on Sunday when she was standing all day Pain location: lower back mainly on L side Pain description: achy, can be sharp at times Aggravating factors: standing for too long, being on her feet Relieving factors: ice or heat, tries not to take pain medication but will take ibuprofen  or Tylenlol if it gets too bad; mm relaxers make her sleepy  PRECAUTIONS: Fall, legally blind (loss of peripheral vision)  RED FLAGS: None   WEIGHT BEARING RESTRICTIONS: No  FALLS:  Has patient fallen in last 6 months? Yes. 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  DIAGNOSTIC FINDINGS:   Per referring provider note: 2V lumbar AP and lateral, done in our office today and reviewed by me. Reviewed with the patient. Normal alignment. Lumbar lordosis is maintained. Lumbar disc space is maintained. Lumbar facet arthrosis is the biggest finding on imaging today, noting this at L4-5 and L5-S1. No compression fractures noted.  PATIENT SURVEYS:  Oswestry: 25/50, severe disability  COGNITION: Overall cognitive status: Within functional limits for tasks assessed     SENSATION: WFL per pt report  Ankles swell occasionally  MUSCLE LENGTH: Hamstrings: tight HS  POSTURE: rounded shoulders, forward head, and posterior pelvic tilt  PALPATION: Palpable muscle tightness in lumbar paraspinals and B QL (L>R) down into upper glutes with more tenderness on L side as compared to R side  LUMBAR ROM:   AROM eval  Flexion WFL, tight HS  Extension Increase in pain  Right lateral flexion Pain on R side  Left lateral flexion Pain on L side  Right rotation Pain on L side  Left rotation WFL   (Blank rows = not tested)  LOWER EXTREMITY ROM:   WFL  Active  Right eval Left eval  Hip flexion    Hip extension    Hip abduction    Hip adduction    Hip internal rotation    Hip external rotation    Knee flexion    Knee extension    Ankle dorsiflexion    Ankle plantarflexion    Ankle inversion    Ankle eversion     (Blank rows = not tested)  LOWER EXTREMITY MMT:    MMT Right eval Left eval  Hip flexion 5 4+  Hip extension    Hip abduction    Hip adduction    Hip internal rotation    Hip external rotation    Knee flexion 5 5  Knee extension 5 5  Ankle dorsiflexion 5 5  Ankle plantarflexion    Ankle inversion    Ankle eversion     (Blank rows = not tested)  LUMBAR SPECIAL TESTS:  None assessed at eval  FUNCTIONAL TESTS:  None assessed at eval  GAIT: Distance walked: various clinic distances Assistive device utilized: None Level of assistance: Complete  Independence Comments: mildly antalgic with decreased gait speed  TREATMENT DATE: PT Evaluation         TherEx To address lumbar tightness: Supine SKTC 3 x 30 sec LLE only, no stretch felt on R side Supine piriformis/cross body stretch 3 x 30 sec B   Added to HEP, see bolded below                                                                                                                        PATIENT EDUCATION:  Education details: Eval findings, PT POC, initial  HEP Person educated: Patient Education method: Explanation, Demonstration, Tactile cues, Verbal cues, and Handouts Education comprehension: verbalized understanding, returned demonstration, and needs further education  HOME EXERCISE PROGRAM: Access Code: [email or web address] URL: [email or web address] Date: 11/19/2023 Prepared by: Waddell Southgate  Exercises - Hooklying Single Knee to Chest Stretch  - 1 x daily - 7 x weekly - 1 sets - 3-5 reps - 30 sec hold - Supine Piriformis Stretch with Leg Straight  - 1 x daily - 7 x weekly - 1 sets - 3-5 reps - 30 sec hold  ASSESSMENT:  CLINICAL IMPRESSION: Patient is a 58 year old female referred to Neuro OPPT for chronic back pain.   Pt's PMH is significant for: fibromyalgia, B shoulder surgery, chronic LBP, anemia, DM, asthma, migraines, Stargardt's disease (legally blind), GERD, HTN. The following deficits were present during the exam: increased pain, impaired function, increased muscle tightness. Based on her fall history, pt is an increased risk for falls. Pt would benefit from skilled PT to address these impairments and functional limitations to maximize functional mobility independence.   OBJECTIVE IMPAIRMENTS: decreased activity tolerance, decreased endurance, difficulty walking, decreased strength, impaired perceived functional ability, improper body mechanics, postural dysfunction, and pain.   ACTIVITY LIMITATIONS: carrying, lifting, bending, sitting, standing,  squatting, and bed mobility  PARTICIPATION LIMITATIONS: meal prep, cleaning, laundry, community activity, and occupation  PERSONAL FACTORS: Fitness, Past/current experiences, Time since onset of injury/illness/exacerbation, and 3+ comorbidities:   fibromyalgia, B shoulder surgery, chronic LBP, anemia, DM, asthma, migraines, Stargardt's disease (legally blind), GERD, HTNare also affecting patient's functional outcome.   REHAB POTENTIAL: Good  CLINICAL DECISION MAKING: Stable/uncomplicated  EVALUATION COMPLEXITY: Low   GOALS: Goals reviewed with patient? Yes  SHORT TERM GOALS: Target date: 12/10/2023   Pt will be independent with initial HEP for improved function and increased independence with management of her pain symptoms. Baseline: Goal status: INITIAL    LONG TERM GOALS: Target date: 12/31/2023   Pt will be independent with final HEP for improved function and increased independence with management of her pain symptoms. Baseline:  Goal status: INITIAL  2.  Pt will improve her score on the Oswestry to </= 20/50 for improved function and decreased disability level. Baseline: 25/50 (10/7) Goal status: INITIAL  3.  Pt will report pain </= 6/10 at the most during her normal daily routine. Baseline: 8/10 (10/7) Goal status: INITIAL  4.  Pt will tolerate standing >/= 30 min with no increase in her pain levels for improved function. Baseline: limited to 30 min (10/7) Goal status: INITIAL    PLAN:  PT FREQUENCY: 1x/week  PT DURATION: 6 weeks  PLANNED INTERVENTIONS: 02835- PT Re-evaluation, 97750- Physical Performance Testing, 97110-Therapeutic exercises, 97530- Therapeutic activity, [id number]- Neuromuscular re-education, 97535- Self Care, 02859- Manual therapy, [id number]- Gait training, [phone]- Aquatic Therapy, [phone]- Electrical stimulation (manual), [phone] (1-2 muscles), [phone] (3+ muscles)- Dry Needling, Patient/Family education, Balance training, Stair training, Taping, Joint  mobilization, Spinal mobilization, Cryotherapy, and Moist heat.  PLAN FOR NEXT SESSION: how is initial HEP? Add to HEP PRN for core strengthening and stabilization, seated QL stretch, LTR, PPT, bridges, marches, dead bugs, palloff press, seated on swiss ball, work on safe lifting techniques  Mental health referral?    Waddell Southgate, PT Waddell Southgate, PT, DPT, CSRS  11/19/2023, 10:14 AM

## 2023-11-25 ENCOUNTER — Ambulatory Visit: Admitting: Physical Therapy

## 2023-12-03 ENCOUNTER — Ambulatory Visit: Admitting: Physical Therapy

## 2023-12-03 DIAGNOSIS — M6281 Muscle weakness (generalized): Secondary | ICD-10-CM

## 2023-12-03 DIAGNOSIS — M5459 Other low back pain: Secondary | ICD-10-CM | POA: Diagnosis not present

## 2023-12-03 NOTE — Therapy (Signed)
 OUTPATIENT PHYSICAL THERAPY THORACOLUMBAR TREATMENT   Patient Name: ROME SCHLAUCH MRN: [id number] DOB:06-21-1965, 58 y.o., female Today's Date: 12/03/2023  END OF SESSION:  PT End of Session - 12/03/23 0931     Visit Number 2    Number of Visits 7   with eval   Date for Recertification  01/14/24    Authorization Type [employer]    PT Start Time 0930    PT Stop Time 1013    PT Time Calculation (min) 43 min    Activity Tolerance Patient tolerated treatment well    Behavior During Therapy [facility] for tasks assessed/performed           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   Asthma 2002   uses inhaler as PRN   Depression    as a child, and stress recently   Diabetes mellitus without complication (HCC)    on meds   Fibrocystic breast 2005   Fibromyalgia    on meds   GERD (gastroesophageal reflux disease)    hx of   Hypertension    on meds   LGSIL (low grade squamous intraepithelial lesion) on Pap smear 2007   Libido, decreased 2010   Migraines    migraines - otc med prn, then maxalt  if needed   Neuromuscular disorder (HCC)    Ovarian cyst    Stargardt's disease 1986   Patient is legally blind   Tachycardia    per patient diagnosed in ER 2014, no problems currently   Trichomonas    Vaginosis 2008   Vitamin D  deficiency    on meds   Past Surgical History:  Procedure Laterality Date   BREAST EXCISIONAL BIOPSY Left    benign   BREAST SURGERY  1986   biopsy - benign   COLONOSCOPY  2016   JMP-MAC-suprep(good)-SSP   CYSTOSCOPY  12/26/2012   Procedure: CYSTOSCOPY;  Surgeon: Rome Rigg, MD;  Location: [facility];  Service: Gynecology;;   DILATION AND CURETTAGE OF UTERUS     ganglion cyst removed from wrist     left   LAPAROSCOPIC ASSISTED VAGINAL HYSTERECTOMY N/A 12/26/2012   Procedure: LAPAROSCOPIC ASSISTED VAGINAL HYSTERECTOMY  Uterine Morcellation, Bilateral Salpingectomy, ;  Surgeon: Rome Rigg, MD;  Location: [facility];  Service: Gynecology;  Laterality: N/A;   MYOMECTOMY     In [place]  - laparotomy   OOPHORECTOMY  2000   laparotomy in [place]    SHOULDER ARTHROSCOPY Left 2021   SHOULDER ARTHROSCOPY W/ ROTATOR CUFF REPAIR Right 2006   TONSILLECTOMY AND ADENOIDECTOMY     TUBAL LIGATION  2000   WISDOM TOOTH EXTRACTION     x 2 teeth   WISDOM TOOTH EXTRACTION     Patient Active Problem List   Diagnosis Date Noted   Vitamin D  deficiency 08/06/2018   Vitamin B 12 deficiency 08/06/2018   Iron deficiency anemia 08/06/2018   Type 2 diabetes mellitus with hyperglycemia (HCC) 09/22/2015   Asthma, chronic 05/11/2014   Legal blindness 05/11/2014   Chronic back pain 05/11/2014   Migraines 05/11/2014   Stargardt's disease    Menorrhagia 09/11/2011    PCP: Merna Huxley, NP  REFERRING PROVIDER: Faye Lauraine PARAS, FNP  REFERRING DIAG: M54.50 (ICD-10-CM) - Low back pain, unspecified  Rationale for Evaluation and Treatment: Rehabilitation  THERAPY DIAG:  Muscle weakness (generalized)  Other low back pain  ONSET DATE: 10/22/2023 (referral date)  SUBJECTIVE:                                                                                                                                                                                           SUBJECTIVE STATEMENT: Zelena  Pt reports that her pain is ok today, better than it has been but not completely gone, 4-5/10  She has to schedule to see her referring provider again after she does more PT visits.   PERTINENT HISTORY:  PMH: fibromyalgia, B shoulder surgery, chronic LBP, anemia, DM, asthma, migraines, Stargardt's disease (legally blind), GERD, HTN  PAIN:  Are you having pain? Yes: NPRS scale: 7-8/10, 10/10 on Sunday when she was standing all day Pain location: lower back mainly on L side Pain description: achy, can be sharp at  times Aggravating factors: standing for too long, being on her feet Relieving factors: ice or heat, tries not to take pain medication but will take ibuprofen  or Tylenlol if it gets too bad; mm relaxers make her sleepy  PRECAUTIONS: Fall, legally blind (loss of peripheral vision)  RED FLAGS: None   WEIGHT BEARING RESTRICTIONS: No  FALLS:  Has patient fallen in last 6 months? Yes. 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  DIAGNOSTIC FINDINGS:  Per referring provider note: 2V lumbar AP and lateral, done in our office today and reviewed by me. Reviewed with the patient. Normal alignment. Lumbar lordosis is maintained. Lumbar disc space is maintained. Lumbar facet arthrosis is the biggest finding on imaging today, noting this at L4-5 and L5-S1. No compression fractures noted.  PATIENT SURVEYS:  Oswestry: 25/50, severe disability  COGNITION: Overall cognitive status: Within functional limits for tasks assessed     SENSATION: WFL per pt report  Ankles  swell occasionally  MUSCLE LENGTH: Hamstrings: tight HS  POSTURE: rounded shoulders, forward head, and posterior pelvic  tilt  PALPATION: Palpable muscle tightness in lumbar paraspinals and B QL (L>R) down into upper glutes with more tenderness on L side as compared to R side  LUMBAR ROM:   AROM eval  Flexion WFL, tight HS  Extension Increase in pain  Right lateral flexion Pain on R side  Left lateral flexion Pain on L side  Right rotation Pain on L side  Left rotation WFL   (Blank rows = not tested)  LOWER EXTREMITY ROM:   WFL  Active  Right eval Left eval  Hip flexion    Hip extension    Hip abduction    Hip adduction    Hip internal rotation    Hip external rotation    Knee flexion    Knee extension    Ankle dorsiflexion    Ankle plantarflexion    Ankle inversion    Ankle eversion     (Blank rows = not tested)  LOWER EXTREMITY MMT:    MMT Right eval Left eval  Hip flexion 5 4+  Hip extension    Hip abduction    Hip adduction    Hip internal rotation    Hip external rotation    Knee flexion 5 5  Knee extension 5 5  Ankle dorsiflexion 5 5  Ankle plantarflexion    Ankle inversion    Ankle eversion     (Blank rows = not tested)  LUMBAR SPECIAL TESTS:  None assessed at eval  FUNCTIONAL TESTS:  None assessed at eval  GAIT: Distance walked: various clinic distances Assistive device utilized: None Level of assistance: Complete Independence Comments: mildly antalgic with decreased gait speed  TREATMENT DATE:          TherEx To address lumbar tightness and work on core stabilization: Supine LTR x 10 reps B with 10-15 sec hold B Feels more of a stretch on her L side Supine PPT Max cues for correct movement performance and activation of abdominal muscles Better performance with legs extended vs in hooklying position Cue to pull hips back helps her to perform exercise correctly 2 x 10 reps with 5 sec hold Supine bridges 3 x 10 reps with 5 sec hold Supine DKTC marches 3 x 5 reps   Added to HEP, see bolded below                                                                                                                         PATIENT EDUCATION:  Education details: continue HEP and added to HEP Person educated: Patient Education method: Programmer, multimedia, Facilities manager, Actor cues, Verbal cues, and Handouts Education comprehension: verbalized understanding, returned demonstration, and needs further education  HOME EXERCISE PROGRAM: Access Code: [email or web address] URL: [email or web address] Date: 11/19/2023 Prepared by: Waddell Southgate  Exercises - Hooklying Single Knee to Chest Stretch  - 1 x daily - 7 x weekly - 1 sets -  3-5 reps - 30 sec hold - Supine Piriformis Stretch with Leg Straight  - 1 x daily - 7 x weekly - 1 sets - 3-5 reps - 30 sec hold - Supine Lower Trunk Rotation  - 1 x daily - 7 x weekly - 3 sets - 10 reps - 10-15 sec hold - Supine Posterior Pelvic Tilt  - 1 x daily - 7 x weekly - 3 sets - 10 reps - 5 sec hold - Supine Bridge  - 1 x daily - 7 x weekly - 3 sets - 10 reps - 5 sec hold - Supine 90/90 Alternating Toe Touch  - 1 x daily - 7 x weekly - 3 sets - 5 reps  ASSESSMENT:  CLINICAL IMPRESSION: Emphasis of skilled PT session on trialing various core stabilization and strengthening exercises in order to add to her HEP. Pt initially struggles to perform PPT correctly but with cues to pull her hips back she is able to perform them. Pt challenged by core strengthening exercises in supine position this date. She continues to benefit from skilled PT services to work towards increased independence with management of her pain symptoms. Continue POC.    OBJECTIVE IMPAIRMENTS: decreased activity tolerance, decreased endurance, difficulty walking, decreased strength, impaired perceived functional ability, improper body mechanics, postural dysfunction, and pain.   ACTIVITY LIMITATIONS: carrying, lifting, bending, sitting, standing, squatting, and bed mobility  PARTICIPATION LIMITATIONS: meal prep, cleaning, laundry, community  activity, and occupation  PERSONAL FACTORS: Fitness, Past/current experiences, Time since onset of injury/illness/exacerbation, and 3+ comorbidities:   fibromyalgia, B shoulder surgery, chronic LBP, anemia, DM, asthma, migraines, Stargardt's disease (legally blind), GERD, HTNare also affecting patient's functional outcome.   REHAB POTENTIAL: Good  CLINICAL DECISION MAKING: Stable/uncomplicated  EVALUATION COMPLEXITY: Low   GOALS: Goals reviewed with patient? Yes  SHORT TERM GOALS: Target date: 12/10/2023   Pt will be independent with initial HEP for improved function and increased independence with management of her pain symptoms. Baseline: Goal status: INITIAL    LONG TERM GOALS: Target date: 12/31/2023   Pt will be independent with final HEP for improved function and increased independence with management of her pain symptoms. Baseline:  Goal status: INITIAL  2.  Pt will improve her score on the Oswestry to </= 20/50 for improved function and decreased disability level. Baseline: 25/50 (10/7) Goal status: INITIAL  3.  Pt will report pain </= 6/10 at the most during her normal daily routine. Baseline: 8/10 (10/7) Goal status: INITIAL  4.  Pt will tolerate standing >/= 30 min with no increase in her pain levels for improved function. Baseline: limited to 30 min (10/7) Goal status: INITIAL    PLAN:  PT FREQUENCY: 1x/week  PT DURATION: 6 weeks  PLANNED INTERVENTIONS: 02835- PT Re-evaluation, 97750- Physical Performance Testing, 97110-Therapeutic exercises, 97530- Therapeutic activity, [id number]- Neuromuscular re-education, 97535- Self Care, 02859- Manual therapy, [id number]- Gait training, [phone]- Aquatic Therapy, [phone]- Electrical stimulation (manual), [phone] (1-2 muscles), [phone] (3+ muscles)- Dry Needling, Patient/Family education, Balance training, Stair training, Taping, Joint mobilization, Spinal mobilization, Cryotherapy, and Moist heat.  PLAN FOR NEXT SESSION: how is  HEP? Add to HEP PRN for core strengthening and stabilization, seated QL stretch, dead bugs, palloff press, seated on swiss ball, work on safe lifting techniques; also interested in addressing her knee/LE pain and potentially interested in aquatic therapy  Mental health referral?    Waddell Southgate, PT Waddell Southgate, PT, DPT, CSRS  12/03/2023, 10:13 AM

## 2023-12-04 NOTE — Progress Notes (Deleted)
 Subjective:    Patient ID: Karen Hardin, female    DOB: Sep 19, 1965, 58 y.o.   MRN: [id number]  HPI 58 year old female who  has a past medical history of Allergy, Anemia, Arthritis, Asthma (2002), Depression, Diabetes mellitus without complication (HCC), Fibrocystic breast (2005), Fibromyalgia, GERD (gastroesophageal reflux disease), Hypertension, LGSIL (low grade squamous intraepithelial lesion) on Pap smear (2007), Libido, decreased (2010), Migraines, Neuromuscular disorder (HCC), Ovarian cyst, Stargardt's disease (1986), Tachycardia, Trichomonas, Vaginosis (2008), and Vitamin D  deficiency.  She presents to the office today for follow up regarding DM and HTN   DM Type 2 -managed with glipizide  2.5 mg xr daily. She has not been checking her blood sugars on a regular basis.  Lab Results  Component Value Date   HGBA1C 6.3 (A) 09/04/2023   HGBA1C 6.3 06/05/2023   HGBA1C 7.0 (H) 01/30/2023   Wt Readings from Last 3 Encounters:  09/04/23 215 lb (97.5 kg)  06/05/23 212 lb (96.2 kg)  03/01/23 217 lb (98.4 kg)   HTN -she is currently managed with Norvasc  10 mg daily and lisinopril  2.5 mg daily. She denies dizziness, lightheadedness, blurred vision or syncope.  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   Asthma 2002   uses inhaler as PRN   Depression    as a child, and stress recently   Diabetes mellitus without complication (HCC)    on meds   Fibrocystic breast 2005   Fibromyalgia    on meds   GERD (gastroesophageal reflux disease)    hx of   Hypertension    on meds   LGSIL (low grade squamous intraepithelial lesion) on Pap smear 2007   Libido, decreased 2010   Migraines    migraines - otc med prn, then maxalt  if needed    Neuromuscular disorder (HCC)    Ovarian cyst    Stargardt's disease 1986   Patient is legally blind   Tachycardia    per patient diagnosed in ER 2014, no problems currently   Trichomonas    Vaginosis 2008   Vitamin D  deficiency    on meds    Social History   Socioeconomic History   Marital status: Married    Spouse name: Not on file   Number of children: 3   Years of education: Not on file   Highest education level: Some college, no degree  Occupational History   Not on file  Tobacco Use   Smoking status: Some Days    Current packs/day: 0.10    Average packs/day: 0.1 packs/day for 21.2 years (2.1 ttl pk-yrs)    Types: Cigarettes    Start date: 02/28/2015   Smokeless tobacco: Never  Vaping Use   Vaping status: Never Used  Substance and Sexual Activity   Alcohol use: No    Alcohol/week: 0.0 standard drinks of alcohol   Drug use: No   Sexual activity: Yes    Birth control/protection: Surgical, Condom  Other Topics Concern   Not on file  Social History Narrative   Is on disability    Married for 21 years    Three boys ( all three live locally)       She  likes to hang out with friends. 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 Right handed   Drinks caffeine   One story home   Social Drivers of Health   Financial Resource Strain: Patient Declined (09/03/2023)   Overall Financial Resource Strain (CARDIA)    Difficulty of Paying Living Expenses: Patient declined  Food Insecurity: Patient Declined (09/03/2023)   Hunger Vital Sign    Worried About Running Out of Food in the Last Year: Patient declined    Ran Out of Food in the Last Year: Patient declined  Transportation Needs: Patient Declined (09/03/2023)   PRAPARE - Administrator, Civil Service (Medical): Patient declined    Lack of Transportation (Non-Medical): Patient declined  Physical Activity: Unknown (09/03/2023)   Exercise Vital Sign    Days of Exercise per Week:  Patient declined    Minutes of Exercise per Session: Not on file  Stress: Patient Declined (09/03/2023)   [employer] of Occupational Health - Occupational Stress Questionnaire    Feeling of Stress: Patient declined  Social Connections: Unknown (09/03/2023)   Social Connection and Isolation Panel    Frequency of Communication with Friends and Family: Patient declined    Frequency of Social Gatherings with Friends and Family: Patient declined    Attends Religious Services: Patient declined    Active Member of Clubs or Organizations: Patient declined    Attends Banker Meetings: Not on file    Marital Status: Married  Intimate Partner Violence: Not At Risk (09/19/2022)   Humiliation, Afraid, Rape, and Kick questionnaire    Fear of Current or Ex-Partner: No    Emotionally Abused: No    Physically Abused: No    Sexually Abused: No    Past Surgical History:  Procedure Laterality Date   BREAST EXCISIONAL BIOPSY Left    benign   BREAST SURGERY  1986   biopsy - benign   COLONOSCOPY  2016   JMP-MAC-suprep(good)-[facility]P   CYSTOSCOPY  12/26/2012   Procedure: CYSTOSCOPY;  Surgeon: Rome Rigg, MD;  Location: [facility];  Service: Gynecology;;   DILATION AND CURETTAGE OF UTERUS     ganglion cyst removed from wrist     left   LAPAROSCOPIC ASSISTED VAGINAL HYSTERECTOMY N/A 12/26/2012   Procedure: LAPAROSCOPIC ASSISTED VAGINAL HYSTERECTOMY Uterine Morcellation, Bilateral Salpingectomy, ;  Surgeon: Rome Rigg, MD;  Location: [facility];  Service: Gynecology;  Laterality: N/A;   MYOMECTOMY     In [place]  - laparotomy   OOPHORECTOMY  2000   laparotomy in [place]    SHOULDER ARTHROSCOPY Left 2021   SHOULDER ARTHROSCOPY W/ ROTATOR CUFF REPAIR Right 2006   TONSILLECTOMY AND ADENOIDECTOMY     TUBAL LIGATION  2000   WISDOM TOOTH EXTRACTION     x 2 teeth   WISDOM TOOTH EXTRACTION      Family History  Problem Relation Age of Onset   Arthritis Mother    Diabetes Mother     Hypertension Mother    Pulmonary embolism Mother        Cardiac arrest    Diverticulitis Mother    Heart disease Father    Heart attack Father    Mesothelioma Father    Hyperlipidemia Maternal Grandmother    Breast cancer Maternal Grandmother    Colitis Paternal Aunt        in 69's   Stomach cancer Paternal Aunt    Heart attack Paternal Aunt    Heart attack Paternal Uncle  Stroke Paternal Aunt    Breast cancer Maternal Aunt    Colon polyps Sister 11   Diverticulitis Sister    Colon cancer Neg Hx    Esophageal cancer Neg Hx    Rectal cancer Neg Hx     Allergies  Allergen Reactions   Cholestatin     Current Outpatient Medications on File Prior to Visit  Medication Sig Dispense Refill   albuterol  (VENTOLIN  HFA) 108 (90 Base) MCG/ACT inhaler Inhale 1-2 puffs into the lungs every 6 (six) hours as needed for wheezing or shortness of breath. 18 g 1   amLODipine  (NORVASC ) 10 MG tablet Take 1 tablet (10 mg total) by mouth daily. 90 tablet 3   cyclobenzaprine  (FLEXERIL ) 10 MG tablet Take 1 tablet (10 mg total) by mouth at bedtime. 20 tablet 0   glipiZIDE  2.5 MG TABS Take 1 tablet by mouth once daily 90 tablet 0   lisinopril  (ZESTRIL ) 2.5 MG tablet Take 1 tablet (2.5 mg total) by mouth daily. 90 tablet 3   Multiple Vitamin (MULTIVITAMIN ADULT PO) Take 1 tablet by mouth daily.     rizatriptan  (MAXALT ) 5 MG tablet Take 1 tablet (5 mg total) by mouth as needed for migraine. May repeat in 2 hours if needed 10 tablet 2   rosuvastatin  (CRESTOR ) 5 MG tablet Take 1 tablet (5 mg total) by mouth daily. 90 tablet 3   No current facility-administered medications on file prior to visit.    LMP 12/14/2012       Objective:   Physical Exam Vitals and nursing note reviewed.  Constitutional:      Appearance: Normal appearance. She is obese.  Cardiovascular:     Rate and Rhythm: Normal rate and regular rhythm.     Pulses: Normal pulses.     Heart sounds: Normal heart sounds.  Pulmonary:      Effort: Pulmonary effort is normal.     Breath sounds: Normal breath sounds.  Skin:    General: Skin is warm and dry.  Neurological:     General: No focal deficit present.     Mental Status: She is alert and oriented to person, place, and time.  Psychiatric:        Mood and Affect: Mood normal.        Behavior: Behavior normal.        Thought Content: Thought content normal.        Judgment: Judgment normal.       Assessment & Plan:

## 2023-12-05 ENCOUNTER — Encounter: Payer: Self-pay | Admitting: Adult Health

## 2023-12-05 ENCOUNTER — Ambulatory Visit: Admitting: Adult Health

## 2023-12-05 VITALS — BP 120/70 | HR 70 | Temp 98.2°F | Ht 67.75 in | Wt 212.0 lb

## 2023-12-05 DIAGNOSIS — Z7984 Long term (current) use of oral hypoglycemic drugs: Secondary | ICD-10-CM | POA: Diagnosis not present

## 2023-12-05 DIAGNOSIS — E119 Type 2 diabetes mellitus without complications: Secondary | ICD-10-CM | POA: Diagnosis not present

## 2023-12-05 DIAGNOSIS — I10 Essential (primary) hypertension: Secondary | ICD-10-CM | POA: Diagnosis not present

## 2023-12-05 LAB — POCT GLYCOSYLATED HEMOGLOBIN (HGB A1C): Hemoglobin A1C: 6.4 % — AB (ref 4.0–5.6)

## 2023-12-05 MED ORDER — GLIPIZIDE 2.5 MG PO TABS: 1.0000 | ORAL_TABLET | Freq: Every day | ORAL | 1 refills | Status: AC

## 2023-12-05 NOTE — Progress Notes (Signed)
 Subjective:    Patient ID: Karen Hardin, female    DOB: 05/13/1965, 58 y.o.   MRN: [id number]  HPI 58 year old female who  has a past medical history of Allergy, Anemia, Arthritis, Asthma (2002), Depression, Diabetes mellitus without complication (HCC), Fibrocystic breast (2005), Fibromyalgia, GERD (gastroesophageal reflux disease), Hypertension, LGSIL (low grade squamous intraepithelial lesion) on Pap smear (2007), Libido, decreased (2010), Migraines, Neuromuscular disorder (HCC), Ovarian cyst, Stargardt's disease (1986), Tachycardia, Trichomonas, Vaginosis (2008), and Vitamin D  deficiency.  She presents to the office today for follow up regarding DM and HTN   DM Type 2 -managed with glipizide  2.5 mg xr daily. She has not been checking her blood sugars on a regular basis. She has started PT in the hopes to getting her moving again  Lab Results  Component Value Date   HGBA1C 6.3 (A) 09/04/2023   HGBA1C 6.3 06/05/2023   HGBA1C 7.0 (H) 01/30/2023   Wt Readings from Last 3 Encounters:  12/05/23 212 lb (96.2 kg)  09/04/23 215 lb (97.5 kg)  06/05/23 212 lb (96.2 kg)   HTN -she is currently managed with Norvasc  10 mg daily and lisinopril  2.5 mg daily. She denies dizziness, lightheadedness, blurred vision or syncope.  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   Asthma 2002   uses inhaler as PRN   Depression    as a child, and stress recently   Diabetes mellitus without complication (HCC)    on meds   Fibrocystic breast 2005   Fibromyalgia    on meds   GERD (gastroesophageal reflux disease)    hx of   Hypertension    on meds   LGSIL (low grade squamous intraepithelial lesion) on Pap smear 2007   Libido, decreased 2010   Migraines     migraines - otc med prn, then maxalt  if needed   Neuromuscular disorder (HCC)    Ovarian cyst    Stargardt's disease 1986   Patient is legally blind   Tachycardia    per patient diagnosed in ER 2014, no problems currently   Trichomonas    Vaginosis 2008   Vitamin D  deficiency    on meds    Social History   Socioeconomic History   Marital status: Married    Spouse name: Not on file   Number of children: 3   Years of education: Not on file   Highest education level: Some college, no degree  Occupational History   Not on file  Tobacco Use   Smoking status: Some Days    Current packs/day: 0.10    Average packs/day: 0.1 packs/day for 21.2 years (2.1 ttl pk-yrs)    Types: Cigarettes    Start date: 02/28/2015   Smokeless tobacco: Never  Vaping Use   Vaping status: Never Used  Substance and Sexual Activity   Alcohol use: No    Alcohol/week: 0.0 standard drinks of alcohol   Drug use: No   Sexual activity: Yes    Birth control/protection: Surgical, Condom  Other Topics Concern   Not on file  Social History Narrative   Is on disability    Married for 21 years    Three boys (  all three live locally)       She likes to hang out with friends. 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 Right handed   Drinks caffeine   One story home   Social Drivers of Health   Financial Resource Strain: Patient Declined (09/03/2023)   Overall Financial Resource Strain (CARDIA)    Difficulty of Paying Living Expenses: Patient declined  Food Insecurity: Patient Declined (09/03/2023)   Hunger Vital Sign    Worried About Running Out of Food in the Last Year: Patient declined    Ran Out of Food in the Last Year: Patient declined  Transportation Needs: Patient Declined (09/03/2023)   PRAPARE - Administrator, Civil Service (Medical): Patient declined    Lack of Transportation (Non-Medical): Patient declined  Physical Activity: Unknown (09/03/2023)    Exercise Vital Sign    Days of Exercise per Week: Patient declined    Minutes of Exercise per Session: Not on file  Stress: Patient Declined (09/03/2023)   [employer] of Occupational Health - Occupational Stress Questionnaire    Feeling of Stress: Patient declined  Social Connections: Unknown (09/03/2023)   Social Connection and Isolation Panel    Frequency of Communication with Friends and Family: Patient declined    Frequency of Social Gatherings with Friends and Family: Patient declined    Attends Religious Services: Patient declined    Active Member of Clubs or Organizations: Patient declined    Attends Banker Meetings: Not on file    Marital Status: Married  Intimate Partner Violence: Not At Risk (09/19/2022)   Humiliation, Afraid, Rape, and Kick questionnaire    Fear of Current or Ex-Partner: No    Emotionally Abused: No    Physically Abused: No    Sexually Abused: No    Past Surgical History:  Procedure Laterality Date   BREAST EXCISIONAL BIOPSY Left    benign   BREAST SURGERY  1986   biopsy - benign   COLONOSCOPY  2016   JMP-MAC-suprep(good)-[facility]P   CYSTOSCOPY  12/26/2012   Procedure: CYSTOSCOPY;  Surgeon: Rome Rigg, MD;  Location: [facility];  Service: Gynecology;;   DILATION AND CURETTAGE OF UTERUS     ganglion cyst removed from wrist     left   LAPAROSCOPIC ASSISTED VAGINAL HYSTERECTOMY N/A 12/26/2012   Procedure: LAPAROSCOPIC ASSISTED VAGINAL HYSTERECTOMY Uterine Morcellation, Bilateral Salpingectomy, ;  Surgeon: Rome Rigg, MD;  Location: [facility];  Service: Gynecology;  Laterality: N/A;   MYOMECTOMY     In [place]  - laparotomy   OOPHORECTOMY  2000   laparotomy in [place]    SHOULDER ARTHROSCOPY Left 2021   SHOULDER ARTHROSCOPY W/ ROTATOR CUFF REPAIR Right 2006   TONSILLECTOMY AND ADENOIDECTOMY     TUBAL LIGATION  2000   WISDOM TOOTH EXTRACTION     x 2 teeth   WISDOM TOOTH EXTRACTION      Family History  Problem Relation Age of  Onset   Arthritis Mother    Diabetes Mother    Hypertension Mother    Pulmonary embolism Mother        Cardiac arrest    Diverticulitis Mother    Heart disease Father    Heart attack Father    Mesothelioma Father    Hyperlipidemia Maternal Grandmother    Breast cancer Maternal Grandmother    Colitis Paternal Aunt        in 71's   Stomach cancer Paternal Aunt    Heart attack  Paternal Aunt    Heart attack Paternal Uncle    Stroke Paternal Aunt    Breast cancer Maternal Aunt    Colon polyps Sister 68   Diverticulitis Sister    Colon cancer Neg Hx    Esophageal cancer Neg Hx    Rectal cancer Neg Hx     Allergies  Allergen Reactions   Cholestatin     Current Outpatient Medications on File Prior to Visit  Medication Sig Dispense Refill   albuterol  (VENTOLIN  HFA) 108 (90 Base) MCG/ACT inhaler Inhale 1-2 puffs into the lungs every 6 (six) hours as needed for wheezing or shortness of breath. 18 g 1   amLODipine  (NORVASC ) 10 MG tablet Take 1 tablet (10 mg total) by mouth daily. 90 tablet 3   cyclobenzaprine  (FLEXERIL ) 10 MG tablet Take 1 tablet (10 mg total) by mouth at bedtime. 20 tablet 0   glipiZIDE  2.5 MG TABS Take 1 tablet by mouth once daily 90 tablet 0   lisinopril  (ZESTRIL ) 2.5 MG tablet Take 1 tablet (2.5 mg total) by mouth daily. 90 tablet 3   Multiple Vitamin (MULTIVITAMIN ADULT PO) Take 1 tablet by mouth daily.     rizatriptan  (MAXALT ) 5 MG tablet Take 1 tablet (5 mg total) by mouth as needed for migraine. May repeat in 2 hours if needed 10 tablet 2   rosuvastatin  (CRESTOR ) 5 MG tablet Take 1 tablet (5 mg total) by mouth daily. 90 tablet 3   No current facility-administered medications on file prior to visit.    BP 120/70   Pulse 70   Temp 98.2 F (36.8 C) (Oral)   Ht 5' 7.75 (1.721 m)   Wt 212 lb (96.2 kg)   LMP 12/14/2012   SpO2 97%   BMI 32.47 kg/m       Objective:   Physical Exam Vitals and nursing note reviewed.  Constitutional:      Appearance:  Normal appearance. She is obese.  Cardiovascular:     Rate and Rhythm: Normal rate and regular rhythm.     Pulses: Normal pulses.     Heart sounds: Normal heart sounds.  Pulmonary:     Effort: Pulmonary effort is normal.     Breath sounds: Normal breath sounds.  Skin:    General: Skin is warm and dry.  Neurological:     General: No focal deficit present.     Mental Status: She is alert and oriented to person, place, and time.  Psychiatric:        Mood and Affect: Mood normal.        Behavior: Behavior normal.        Thought Content: Thought content normal.        Judgment: Judgment normal.       Assessment & Plan:  1. Diabetes mellitus treated with oral medication (HCC) (Primary)  - POC HgB A1c- 6.4 - stayed about the same but at goal  - Encouraged lifestyle modifications  - Follow up in 6 months  - glipiZIDE  2.5 MG TABS; Take 1 tablet by mouth daily.  Dispense: 90 tablet; Refill: 1  2. Essential hypertension - well controlled. No change in medication   Darleene Shape, NP

## 2023-12-10 ENCOUNTER — Ambulatory Visit: Admitting: Physical Therapy

## 2023-12-10 DIAGNOSIS — M5459 Other low back pain: Secondary | ICD-10-CM

## 2023-12-10 DIAGNOSIS — M6281 Muscle weakness (generalized): Secondary | ICD-10-CM

## 2023-12-10 NOTE — Therapy (Signed)
 OUTPATIENT PHYSICAL THERAPY THORACOLUMBAR TREATMENT   Patient Name: Karen Hardin MRN: [id number] DOB:1966/01/04, 58 y.o., female Today's Date: 12/10/2023  END OF SESSION:  PT End of Session - 12/10/23 0853     Visit Number 3    Number of Visits 7   with eval   Date for Recertification  01/14/24    Authorization Type [employer]    PT Start Time [phone]   Pt arrived late   PT Stop Time 0930    PT Time Calculation (min) 39 min    Activity Tolerance Patient tolerated treatment well    Behavior During Therapy [facility] for tasks assessed/performed           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   Asthma 2002   uses inhaler as PRN   Depression    as a child, and stress recently   Diabetes mellitus without complication (HCC)    on meds   Fibrocystic breast 2005   Fibromyalgia    on meds   GERD (gastroesophageal reflux disease)    hx of   Hypertension    on meds   LGSIL (low grade squamous intraepithelial lesion) on Pap smear 2007   Libido, decreased 2010   Migraines    migraines - otc med prn, then maxalt  if needed   Neuromuscular disorder (HCC)    Ovarian cyst    Stargardt's disease 1986   Patient is legally blind   Tachycardia    per patient diagnosed in ER 2014, no problems currently   Trichomonas    Vaginosis 2008   Vitamin D  deficiency    on meds   Past Surgical History:  Procedure Laterality Date   BREAST EXCISIONAL BIOPSY Left    benign   BREAST SURGERY  1986   biopsy - benign   COLONOSCOPY  2016   JMP-MAC-suprep(good)-SSP   CYSTOSCOPY  12/26/2012   Procedure: CYSTOSCOPY;  Surgeon: Rome Rigg, MD;  Location: [facility];  Service: Gynecology;;   DILATION AND CURETTAGE OF UTERUS     ganglion cyst removed from wrist     left   LAPAROSCOPIC ASSISTED VAGINAL HYSTERECTOMY N/A 12/26/2012   Procedure: LAPAROSCOPIC ASSISTED VAGINAL  HYSTERECTOMY Uterine Morcellation, Bilateral Salpingectomy, ;  Surgeon: Rome Rigg, MD;  Location: [facility];  Service: Gynecology;  Laterality: N/A;   MYOMECTOMY     In [place]  - laparotomy   OOPHORECTOMY  2000   laparotomy in [place]    SHOULDER ARTHROSCOPY Left 2021   SHOULDER ARTHROSCOPY W/ ROTATOR CUFF REPAIR Right 2006   TONSILLECTOMY AND ADENOIDECTOMY     TUBAL LIGATION  2000   WISDOM TOOTH EXTRACTION     x 2 teeth   WISDOM TOOTH EXTRACTION     Patient Active Problem List   Diagnosis Date Noted   Vitamin D  deficiency 08/06/2018   Vitamin B 12 deficiency 08/06/2018   Iron deficiency anemia 08/06/2018   Type 2 diabetes mellitus with hyperglycemia (HCC) 09/22/2015   Asthma, chronic 05/11/2014   Legal blindness 05/11/2014   Chronic back pain 05/11/2014   Migraines 05/11/2014   Stargardt's disease    Menorrhagia 09/11/2011    PCP: Merna Huxley, NP  REFERRING PROVIDER: Faye Lauraine PARAS, FNP  REFERRING DIAG: M54.50 (ICD-10-CM) - Low back pain, unspecified  Rationale for Evaluation and Treatment: Rehabilitation  THERAPY DIAG:  Muscle weakness (generalized)  Other low back pain  ONSET DATE: 10/22/2023 (referral date)  SUBJECTIVE:                                                                                                                                                                                           SUBJECTIVE STATEMENT: Karen Hardin  Pt reports her back is more painful today, thinks it is due to the weather. Rating it as a 9/10. Has been working on her HEP, PPT are still challenging.     PERTINENT HISTORY:  PMH: fibromyalgia, B shoulder surgery, chronic LBP, anemia, DM, asthma, migraines, Stargardt's disease (legally blind), GERD, HTN  PAIN:  Are you having pain? Yes: NPRS scale: 9/10 Pain location: lower back mainly on L side Pain description: achy, can be sharp at times Aggravating factors: standing for too long, being on her  feet Relieving factors: ice or heat, tries not to take pain medication but will take ibuprofen  or Tylenlol if it gets too bad; mm relaxers make her sleepy  PRECAUTIONS: Fall, legally blind (loss of peripheral vision)  RED FLAGS: None   WEIGHT BEARING RESTRICTIONS: No  FALLS:  Has patient fallen in last 6 months? Yes. 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  DIAGNOSTIC FINDINGS:  Per referring provider note: 2V lumbar AP and lateral, done in our office today and reviewed by me. Reviewed with the patient. Normal alignment. Lumbar lordosis is maintained. Lumbar disc space is maintained. Lumbar facet arthrosis is the biggest finding on imaging today, noting this at L4-5 and L5-S1. No compression fractures noted.  PATIENT SURVEYS:  Oswestry: 25/50, severe disability  COGNITION: Overall cognitive status: Within functional limits for tasks assessed     SENSATION: WFL per pt report  Ankles swell occasionally  MUSCLE LENGTH: Hamstrings: tight HS  POSTURE: rounded shoulders, forward head, and posterior pelvic tilt  PALPATION: Palpable muscle tightness in lumbar  paraspinals and B QL (L>R) down into upper glutes with more tenderness on L side as compared to R side  LUMBAR ROM:   AROM eval  Flexion WFL, tight HS  Extension Increase in pain  Right lateral flexion Pain on R side  Left lateral flexion Pain on L side  Right rotation Pain on L side  Left rotation WFL   (Blank rows = not tested)  LOWER EXTREMITY ROM:   WFL  Active  Right eval Left eval  Hip flexion    Hip extension    Hip abduction    Hip adduction    Hip internal rotation    Hip external rotation    Knee flexion    Knee extension    Ankle dorsiflexion    Ankle plantarflexion    Ankle inversion    Ankle eversion     (Blank rows = not tested)  LOWER EXTREMITY MMT:    MMT Right eval Left eval  Hip flexion 5 4+  Hip extension    Hip abduction    Hip adduction    Hip internal rotation    Hip external rotation    Knee flexion 5 5  Knee extension 5 5  Ankle dorsiflexion 5 5  Ankle plantarflexion    Ankle inversion    Ankle eversion     (Blank rows = not tested)  LUMBAR SPECIAL TESTS:  None assessed at eval  FUNCTIONAL TESTS:  None assessed at eval  GAIT: Distance walked: various clinic distances Assistive device utilized: None Level of assistance: Complete Independence Comments: mildly antalgic with decreased gait speed  TREATMENT DATE:         Ther Act  Reviewed PPTs and used tactile cues (therapist hand on low back) to facilitate proper technique. Pt performed 10 reps and reported feeling a pop in low back with movement, but not painful.  PPT w/glute bridge, x10 reps w/5-10s isometric hold.  Sidelying clamshells, x10 reps per side without resistance. Progressed to 10 reps per side w/yellow resistance band around distal quads. Min cues for improved eccentric control. Added to HEP (see bolded below)  Seated march overs using 15# KB, x15 reps per side, for improved core stability and functional hip strength.  Seated pelvic circles in clockwise and  counterclockwise position on large blue theraball, x10 reps per direction for improved pelvic mobility and stabilization. Pt reported feeling good stretch w/this.  Seated large blue theraball roll outs in fwd and lateral directions for improved spinal mobility and pain modulation. Educated pt on where to obtain theraball ([employer]) and which size to order (30).  Pt rated pain as 7/10 at end of session     PATIENT EDUCATION:  Education details: Updates to HEP, where to purchase theraball  Person educated: Patient Education method: Explanation, Demonstration, Tactile cues, Verbal cues, and Handouts Education comprehension: verbalized understanding, returned demonstration, and needs further education  HOME EXERCISE PROGRAM: Access Code: [email or web address] URL: [email or web address] Date: 11/19/2023 Prepared by: Waddell Southgate  Exercises - Hooklying Single Knee to Chest Stretch  - 1 x daily - 7 x weekly - 1 sets - 3-5 reps - 30 sec hold - Supine Piriformis Stretch with Leg Straight  - 1 x daily - 7 x weekly - 1 sets - 3-5 reps - 30 sec hold - Supine Lower Trunk Rotation  - 1 x daily - 7 x weekly - 3 sets - 10  reps - 10-15 sec hold - Supine Posterior Pelvic Tilt  - 1 x daily - 7 x weekly - 3 sets - 10 reps - 5 sec hold - Supine Bridge  - 1 x daily - 7 x weekly - 3 sets - 10 reps - 5 sec hold - Supine 90/90 Alternating Toe Touch  - 1 x daily - 7 x weekly - 3 sets - 5 reps - Clamshell with Resistance  - 1 x daily - 7 x weekly - 3 sets - 10 reps  ASSESSMENT:  CLINICAL IMPRESSION: Emphasis of skilled PT session on STG assessment, improved core stability, functional hip strength and posterior chain strength. Pt has met her 1 STG, reporting and demonstrating independence w/HEP. Pt reported increased pain levels today, starting at 9/10 and reporting 7/10 by end of session. Pt reported most pain relief w/use of theraball, so encouraged pt to obtain one for home use and will continue to use in PT.  Continue POC.    OBJECTIVE IMPAIRMENTS: decreased activity tolerance, decreased endurance, difficulty walking, decreased strength, impaired perceived functional ability, improper body mechanics, postural dysfunction, and pain.   ACTIVITY LIMITATIONS: carrying, lifting, bending, sitting, standing, squatting, and bed mobility  PARTICIPATION LIMITATIONS: meal prep, cleaning, laundry, community activity, and occupation  PERSONAL FACTORS: Fitness, Past/current experiences, Time since onset of injury/illness/exacerbation, and 3+ comorbidities:   fibromyalgia, B shoulder surgery, chronic LBP, anemia, DM, asthma, migraines, Stargardt's disease (legally blind), GERD, HTNare also affecting patient's functional outcome.   REHAB POTENTIAL: Good  CLINICAL DECISION MAKING: Stable/uncomplicated  EVALUATION COMPLEXITY: Low   GOALS: Goals reviewed with patient? Yes  SHORT TERM GOALS: Target date: 12/10/2023   Pt will be independent with initial HEP for improved function and increased independence with management of her pain symptoms. Baseline: Goal status: MET    LONG TERM GOALS: Target date: 12/31/2023   Pt will be independent with final HEP for improved function and increased independence with management of her pain symptoms. Baseline:  Goal status: INITIAL  2.  Pt will improve her score on the Oswestry to </= 20/50 for improved function and decreased disability level. Baseline: 25/50 (10/7) Goal status: INITIAL  3.  Pt will report pain </= 6/10 at the most during her normal daily routine. Baseline: 8/10 (10/7) Goal status: INITIAL  4.  Pt will tolerate standing >/= 30 min with no increase in her pain levels for improved function. Baseline: limited to 30 min (10/7) Goal status: INITIAL    PLAN:  PT FREQUENCY: 1x/week  PT DURATION: 6 weeks  PLANNED INTERVENTIONS: 02835- PT Re-evaluation, 97750- Physical Performance Testing, 97110-Therapeutic exercises, 97530- Therapeutic  activity, [id number]- Neuromuscular re-education, 97535- Self Care, [id number]- Manual therapy, [id number]- Gait training, [phone]- Aquatic Therapy, [phone]- Electrical stimulation (manual), [phone] (1-2 muscles), [id number] (3+ muscles)- Dry Needling, Patient/Family education, Balance training, Stair training, Taping, Joint mobilization, Spinal mobilization, Cryotherapy, and Moist heat.  PLAN FOR NEXT SESSION: how is HEP? Add to HEP PRN for core strengthening and stabilization, seated QL stretch, dead bugs, palloff press, seated on swiss ball, work on safe lifting techniques; also interested in addressing her knee/LE pain and potentially interested in aquatic therapy  Mental health referral?    Demetries Coia E Shiana Rappleye, PT, DPT  12/10/2023, 9:30 AM

## 2023-12-17 ENCOUNTER — Encounter: Payer: Self-pay | Admitting: Family Medicine

## 2023-12-17 ENCOUNTER — Ambulatory Visit: Attending: Family Medicine | Admitting: Physical Therapy

## 2023-12-17 ENCOUNTER — Ambulatory Visit (INDEPENDENT_AMBULATORY_CARE_PROVIDER_SITE_OTHER): Admitting: Family Medicine

## 2023-12-17 DIAGNOSIS — M5459 Other low back pain: Secondary | ICD-10-CM | POA: Diagnosis not present

## 2023-12-17 DIAGNOSIS — Z0000 Encounter for general adult medical examination without abnormal findings: Secondary | ICD-10-CM | POA: Diagnosis not present

## 2023-12-17 DIAGNOSIS — M6281 Muscle weakness (generalized): Secondary | ICD-10-CM | POA: Insufficient documentation

## 2023-12-17 NOTE — Therapy (Signed)
 OUTPATIENT PHYSICAL THERAPY THORACOLUMBAR TREATMENT   Patient Name: Karen Hardin MRN: [id number] DOB:[birth year], 59 y.o., female Today's Date: 12/17/2023  END OF SESSION:  PT End of Session - 12/17/23 0852     Visit Number 4    Number of Visits 7   with eval   Date for Recertification  01/14/24    Authorization Type [employer]    Authorization Time Period 8 visits 11/19/23-02/17/24    PT Start Time 0850   Pt arrived late   PT Stop Time 0930    PT Time Calculation (min) 40 min    Activity Tolerance Patient tolerated treatment well;No increased pain    Behavior During Therapy WFL for tasks assessed/performed           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   Asthma 2002   uses inhaler as PRN   Depression    as a child, and stress recently   Diabetes mellitus without complication (HCC)    on meds   Fibrocystic breast 2005   Fibromyalgia    on meds   GERD (gastroesophageal reflux disease)    hx of   Hypertension    on meds   LGSIL (low grade squamous intraepithelial lesion) on Pap smear 2007   Libido, decreased 2010   Migraines    migraines - otc med prn, then maxalt  if needed   Neuromuscular disorder (HCC)    Ovarian cyst    Stargardt's disease 1986   Patient is legally blind   Tachycardia    per patient diagnosed in ER 2014, no problems currently   Trichomonas    Vaginosis 2008   Vitamin D  deficiency    on meds   Past Surgical History:  Procedure Laterality Date   BREAST EXCISIONAL BIOPSY Left    benign   BREAST SURGERY  1986   biopsy - benign   COLONOSCOPY  2016   JMP-MAC-suprep(good)-[facility]   CYSTOSCOPY  12/26/2012   Procedure: CYSTOSCOPY;  Surgeon: Rome Rigg, MD;  Location: [facility];  Service: Gynecology;;   DILATION AND CURETTAGE OF UTERUS     ganglion cyst removed from wrist     left   LAPAROSCOPIC ASSISTED VAGINAL  HYSTERECTOMY N/A 12/26/2012   Procedure: LAPAROSCOPIC ASSISTED VAGINAL HYSTERECTOMY Uterine Morcellation, Bilateral Salpingectomy, ;  Surgeon: Rome Rigg, MD;  Location: [facility];  Service: Gynecology;  Laterality: N/A;   MYOMECTOMY     In [place]  - laparotomy   OOPHORECTOMY  2000   laparotomy in [place]    SHOULDER ARTHROSCOPY Left 2021   SHOULDER ARTHROSCOPY W/ ROTATOR CUFF REPAIR Right 2006   TONSILLECTOMY AND ADENOIDECTOMY     TUBAL LIGATION  2000   WISDOM TOOTH EXTRACTION     x 2 teeth   WISDOM TOOTH EXTRACTION     Patient Active Problem List   Diagnosis Date Noted   Vitamin D  deficiency 08/06/2018   Vitamin B 12 deficiency 08/06/2018   Iron deficiency anemia 08/06/2018   Type 2 diabetes mellitus with hyperglycemia (HCC) 09/22/2015   Asthma, chronic 05/11/2014   Legal blindness 05/11/2014   Chronic back pain 05/11/2014   Migraines 05/11/2014   Stargardt's disease    Menorrhagia 09/11/2011    PCP: Merna Huxley, NP  REFERRING PROVIDER: Faye Lauraine PARAS, FNP  REFERRING DIAG: M54.50 (ICD-10-CM) - Low back pain,  unspecified  Rationale for Evaluation and Treatment: Rehabilitation  THERAPY DIAG:  Muscle weakness (generalized)  Other low back pain  ONSET DATE: 10/22/2023 (referral date)  SUBJECTIVE:                                                                                                                                                                                           SUBJECTIVE STATEMENT: Karen Hardin  Pt reports her back is a bit better today. HEP is going okay. Got a large exercise ball but thinks she needs a new one, hers deflates too quickly. No falls.     PERTINENT HISTORY:  PMH: fibromyalgia, B shoulder surgery, chronic LBP, anemia, DM, asthma, migraines, Stargardt's disease (legally blind), GERD, HTN  PAIN:  Are you having pain? Yes: NPRS scale: 7/10 Pain location: lower back mainly on L side Pain description: achy, can be sharp  at times Aggravating factors: standing for too long, being on her feet Relieving factors: ice or heat, tries not to take pain medication but will take ibuprofen  or Tylenlol if it gets too bad; mm relaxers make her sleepy  PRECAUTIONS: Fall, legally blind (loss of peripheral vision)  RED FLAGS: None   WEIGHT BEARING RESTRICTIONS: No  FALLS:  Has patient fallen in last 6 months? Yes. 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  DIAGNOSTIC FINDINGS:  Per referring provider note: 2V lumbar AP and lateral, done in our office today and reviewed by me. Reviewed with the patient. Normal alignment. Lumbar lordosis is maintained. Lumbar disc space is maintained. Lumbar facet arthrosis is the biggest finding on imaging today, noting this at L4-5 and L5-S1. No compression fractures noted.  PATIENT SURVEYS:  Oswestry: 25/50, severe disability  COGNITION: Overall cognitive status: Within functional limits for tasks assessed     SENSATION: [facility]  per pt report  Ankles swell occasionally  MUSCLE LENGTH: Hamstrings: tight HS  POSTURE: rounded shoulders, forward head, and posterior  pelvic tilt  PALPATION: Palpable muscle tightness in lumbar paraspinals and B QL (L>R) down into upper glutes with more tenderness on L side as compared to R side  LUMBAR ROM:   AROM eval  Flexion WFL, tight HS  Extension Increase in pain  Right lateral flexion Pain on R side  Left lateral flexion Pain on L side  Right rotation Pain on L side  Left rotation WFL   (Blank rows = not tested)  LOWER EXTREMITY ROM:   WFL  Active  Right eval Left eval  Hip flexion    Hip extension    Hip abduction    Hip adduction    Hip internal rotation    Hip external rotation    Knee flexion    Knee extension    Ankle dorsiflexion    Ankle plantarflexion    Ankle inversion    Ankle eversion     (Blank rows = not tested)  LOWER EXTREMITY MMT:    MMT Right eval Left eval  Hip flexion 5 4+  Hip extension    Hip abduction    Hip adduction    Hip internal rotation    Hip external rotation    Knee flexion 5 5  Knee extension 5 5  Ankle dorsiflexion 5 5  Ankle plantarflexion    Ankle inversion    Ankle eversion     (Blank rows = not tested)  LUMBAR SPECIAL TESTS:  None assessed at eval  FUNCTIONAL TESTS:  None assessed at eval  GAIT: Distance walked: various clinic distances Assistive device utilized: None Level of assistance: Complete Independence Comments: mildly antalgic with decreased gait speed  TREATMENT DATE:         Ther Act  SciFit multi-peaks level 5.0 for 8 minutes using BUE/BLEs for neural priming for reciprocal movement, dynamic cardiovascular warmup and improved shoulder mobility.  The following were performed for improved spinal mobility, core stability and pain modulation: Quadruped cat cow, x15 reps. Min cues for proper technique. Decreased mobility w/cat vs cow. Added to HEP (see bolded below)  Child's pose in fwd and lateral positions, x90s per side. Added to HEP (see bolded below)  Seated oblique crunches w/12# KB, x15 reps per side. Mod cues to avoid  shoulder shrug throughout. Added to HEP Seated alt march w/12# KB goblet hold, 3x8 reps per side.  Pt rated pain as 5/10 following session.     PATIENT EDUCATION:  Education details: Updates to HEP, think about DC vs recert for next session   Person educated: Patient Education method: Explanation, Demonstration, Tactile cues, Verbal cues, and Handouts Education comprehension: verbalized understanding, returned demonstration, and needs further education  HOME EXERCISE PROGRAM: Access Code: [email or web address] URL: [email or web address] Date: 11/19/2023 Prepared by: Waddell Southgate  Exercises - Hooklying Single Knee to Chest Stretch  - 1 x daily - 7 x weekly - 1 sets - 3-5 reps - 30 sec hold - Supine Piriformis Stretch with Leg Straight  - 1 x daily - 7 x weekly - 1 sets - 3-5 reps - 30 sec hold - Supine Lower Trunk Rotation  - 1 x daily - 7 x weekly - 3 sets - 10 reps - 10-15 sec hold - Supine Posterior Pelvic Tilt  - 1 x daily - 7 x weekly - 3 sets - 10 reps - 5 sec hold - Supine Bridge  -  1 x daily - 7 x weekly - 3 sets - 10 reps - 5 sec hold - Supine 90/90 Alternating Toe Touch  - 1 x daily - 7 x weekly - 3 sets - 5 reps - Clamshell with Resistance  - 1 x daily - 7 x weekly - 3 sets - 10 reps - Cat Cow  - 1 x daily - 7 x weekly - 3 sets - 10 reps - Child's Pose with Sidebending  - 1 x daily - 7 x weekly - 1-2 minutes hold  ASSESSMENT:  CLINICAL IMPRESSION: Emphasis of skilled PT session on improved spinal mobility, core stability and pain modulation. Pt initially reporting 7/10 low back pain that improved to 5/10 by end of session. Pt most challenged by oblique crunches, frequently performing shoulder shrug compensation due to core weakness. Pt unsure if she wants to add more appointments at end of POC today, so will inform primary therapist next session. Continue POC.    OBJECTIVE IMPAIRMENTS: decreased activity tolerance, decreased endurance, difficulty walking, decreased  strength, impaired perceived functional ability, improper body mechanics, postural dysfunction, and pain.   ACTIVITY LIMITATIONS: carrying, lifting, bending, sitting, standing, squatting, and bed mobility  PARTICIPATION LIMITATIONS: meal prep, cleaning, laundry, community activity, and occupation  PERSONAL FACTORS: Fitness, Past/current experiences, Time since onset of injury/illness/exacerbation, and 3+ comorbidities:   fibromyalgia, B shoulder surgery, chronic LBP, anemia, DM, asthma, migraines, Stargardt's disease (legally blind), GERD, HTNare also affecting patient's functional outcome.   REHAB POTENTIAL: Good  CLINICAL DECISION MAKING: Stable/uncomplicated  EVALUATION COMPLEXITY: Low   GOALS: Goals reviewed with patient? Yes  SHORT TERM GOALS: Target date: 12/10/2023   Pt will be independent with initial HEP for improved function and increased independence with management of her pain symptoms. Baseline: Goal status: MET    LONG TERM GOALS: Target date: 12/31/2023   Pt will be independent with final HEP for improved function and increased independence with management of her pain symptoms. Baseline:  Goal status: INITIAL  2.  Pt will improve her score on the Oswestry to </= 20/50 for improved function and decreased disability level. Baseline: 25/50 (10/7) Goal status: INITIAL  3.  Pt will report pain </= 6/10 at the most during her normal daily routine. Baseline: 8/10 (10/7) Goal status: INITIAL  4.  Pt will tolerate standing >/= 30 min with no increase in her pain levels for improved function. Baseline: limited to 30 min (10/7) Goal status: INITIAL    PLAN:  PT FREQUENCY: 1x/week  PT DURATION: 6 weeks  PLANNED INTERVENTIONS: 02835- PT Re-evaluation, 97750- Physical Performance Testing, 97110-Therapeutic exercises, 97530- Therapeutic activity, [id number]- Neuromuscular re-education, 97535- Self Care, [id number]- Manual therapy, [id number]- Gait training, [phone]- Aquatic  Therapy, [phone]- Electrical stimulation (manual), [phone] (1-2 muscles), [id number] (3+ muscles)- Dry Needling, Patient/Family education, Balance training, Stair training, Taping, Joint mobilization, Spinal mobilization, Cryotherapy, and Moist heat.  PLAN FOR NEXT SESSION: Pt unsure if she wanted to DC or recert and add aquatic appointments on 11/4. Goals and Recert vs DC.   how is HEP? Add to HEP PRN for core strengthening and stabilization, seated QL stretch, dead bugs, palloff press, seated on swiss ball, work on safe lifting techniques; also interested in addressing her knee/LE pain and potentially interested in aquatic therapy    Tysha Grismore E Bethanne Mule, PT, DPT  12/17/2023, 9:31 AM

## 2023-12-17 NOTE — Progress Notes (Signed)
 PATIENT CHECK-IN and HEALTH RISK ASSESSMENT QUESTIONNAIRE:  -completed by phone/video for upcoming Medicare Preventive Visit   Pre-Visit Check-in: 1)Vitals (height, wt, BP, etc) - record in vitals section for visit on day of visit Request home vitals (wt, BP, etc.) and enter into vitals, THEN update Vital Signs SmartPhrase below at the top of the HPI. See below.  2)Review and Update Medications, Allergies PMH, Surgeries, Social history in Epic 3)Hospitalizations in the last year with date/reason? NO   4)Review and Update Care Team (patient's specialists) in Epic 5) Complete PHQ9 in Epic  6) Complete Fall Screening in Epic 7)Review all Health Maintenance Due and order if not done.  Medicare Wellness Patient Questionnaire:  Answer theses question about your habits: How often do you have a drink containing alcohol?NO  How many drinks containing alcohol do you have on a typical day when you are drinking?NO  How often do you have six or more drinks on one occasion?No  Have you ever smoked?Yes  Quit date if applicable? Na   How many packs a day do/did you smoke? Less than a pack  Do you use smokeless tobacco?No  Do you use an illicit drugs?NO  On average, how many days per week do you engage in moderate to strenuous exercise (like a brisk walk)?Yes, 4 days per week  On average, how many minutes do you engage in exercise at this level?30 minutes  Are you sexually active? NO Number of partners?Na  Typical breakfast: sometimes the patient doesn't eat breakfast Typical lunch:  none  Typical dinner:Chicken fish, and some red meat  Typical snacks: Chips, crackers, candy   Beverages: Coffee, sweet tea, water   Answer theses question about your everyday activities: Can you perform most household chores?Yes  Are you deaf or have significant trouble hearing?No  Do you feel that you have a problem with memory?No  Do you feel safe at home?yes  Last dentist visit?Feb 2025 8. Do you have any  difficulty performing your everyday activities?No  Are you having any difficulty walking, taking medications on your own, and or difficulty managing daily home needs?No  Do you have difficulty walking or climbing stairs?No  Do you have difficulty dressing or bathing?NO  Do you have difficulty doing errands alone such as visiting a doctor's office or shopping?Yes  Do you currently have any difficulty preparing food and eating?no  Do you currently have any difficulty using the toilet?No  Do you have any difficulty managing your finances?NO  Do you have any difficulties with housekeeping of managing your housekeeping?NO    Do you have Advanced Directives in place (Living Will, Healthcare Power or Attorney)? No    Last eye Exam and location? [id number], [facility]    Do you currently use prescribed or non-prescribed narcotic or opioid pain medications?no   Do you have a history or close family history of breast, ovarian, tubal or peritoneal cancer or a family member with BRCA (breast cancer susceptibility 1 and 2) gene mutations?Yes, grandmother and aunt    Nurse/Assistant Credentials/time stamp: Leah A.Wright CMA 10:57 am     ----------------------------------------------------------------------------------------------------------------------------------------------------------------------------------------------------------------------  Because this visit was a virtual/telehealth visit, some criteria may be missing or patient reported. Any vitals not documented were not able to be obtained and vitals that have been documented are patient reported.    MEDICARE ANNUAL PREVENTIVE VISIT WITH PROVIDER: (Welcome to Medicare, initial annual wellness or annual wellness exam)  Virtual Visit via Phone Note  I connected with Karen Hardin on 12/17/23  by phone and verified that I am speaking with the correct person using two identifiers.  Location patient: home Location  provider:work or home office Persons participating in the virtual visit: patient, provider  Concerns and/or follow up today: reports things are stable.    See HM section in Epic for other details of completed HM.    ROS: negative for report of fevers, unintentional weight loss, hearing loss or change, chest pain, sob, hemoptysis, melena, hematochezia, hematuria, falls, bleeding or bruising, thoughts of suicide or self harm, memory loss  Patient-completed extensive health risk assessment - reviewed and discussed with the patient: See Health Risk Assessment completed with patient prior to the visit either above or in recent phone note. This was reviewed in detailed with the patient today and appropriate recommendations, orders and referrals were placed as needed per Summary below and patient instructions.   Review of Medical History: -PMH, PSH, Family History and current specialty and care providers reviewed and updated and listed below   Patient Care Team: Merna Huxley, NP as PCP - General (Family Medicine) Dr. 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   Asthma 2002   uses inhaler as PRN   Depression    as a child, and stress recently   Diabetes mellitus without complication (HCC)    on meds   Fibrocystic breast 2005   Fibromyalgia    on meds   GERD (gastroesophageal reflux disease)    hx of   Hypertension    on meds   LGSIL (low grade squamous intraepithelial lesion) on Pap smear 2007   Libido, decreased 2010   Migraines    migraines - otc med prn, then maxalt  if needed   Neuromuscular disorder (HCC)    Ovarian cyst    Stargardt's disease 1986   Patient is legally blind   Tachycardia    per patient diagnosed in ER 2014, no problems currently   Trichomonas    Vaginosis 2008   Vitamin D  deficiency     on meds    Past Surgical History:  Procedure Laterality Date   BREAST EXCISIONAL BIOPSY Left    benign   BREAST SURGERY  1986   biopsy - benign   COLONOSCOPY  2016   JMP-MAC-suprep(good)-[facility]   CYSTOSCOPY  12/26/2012   Procedure: CYSTOSCOPY;  Surgeon: Rome Rigg, MD;  Location: [facility];  Service: Gynecology;;   DILATION AND CURETTAGE OF UTERUS     ganglion cyst removed from wrist     left   LAPAROSCOPIC ASSISTED VAGINAL HYSTERECTOMY N/A 12/26/2012   Procedure: LAPAROSCOPIC ASSISTED VAGINAL HYSTERECTOMY Uterine Morcellation, Bilateral Salpingectomy, ;  Surgeon: Rome Rigg, MD;  Location: [facility];  Service: Gynecology;  Laterality: N/A;   MYOMECTOMY     In [place]  - laparotomy   OOPHORECTOMY  2000   laparotomy in [place]    SHOULDER ARTHROSCOPY Left 2021   SHOULDER ARTHROSCOPY W/ ROTATOR CUFF REPAIR Right 2006   TONSILLECTOMY AND ADENOIDECTOMY     TUBAL LIGATION  2000   WISDOM TOOTH EXTRACTION     x 2 teeth   WISDOM TOOTH EXTRACTION      Social History   Socioeconomic History   Marital status: Married    Spouse name: Not on file   Number of children: 3  Years of education: Not on file   Highest education level: Some college, no degree  Occupational History   Not on file  Tobacco Use   Smoking status: Some Days    Current packs/day: 0.10    Average packs/day: 0.1 packs/day for 21.2 years (2.1 ttl pk-yrs)    Types: Cigarettes    Start date: 02/28/2015   Smokeless tobacco: Never  Vaping Use   Vaping status: Never Used  Substance and Sexual Activity   Alcohol use: No    Alcohol/week: 0.0 standard drinks of alcohol   Drug use: No   Sexual activity: Yes    Birth control/protection: Surgical, Condom  Other Topics Concern   Not on file  Social History Narrative   Is on disability    Married for 21 years    Three boys ( all three live locally)       She likes to hang out with friends. 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 Right handed   Drinks caffeine   One story home   Social Drivers of Health   Financial Resource Strain: Patient Declined (09/03/2023)   Overall Financial Resource Strain (CARDIA)    Difficulty of Paying Living Expenses: Patient declined  Food Insecurity: Patient Declined (09/03/2023)   Hunger Vital Sign    Worried About Running Out of Food in the Last Year: Patient declined    Ran Out of Food in the Last Year: Patient declined  Transportation Needs: Patient Declined (09/03/2023)   PRAPARE - Administrator, Civil Service (Medical): Patient declined    Lack of Transportation (Non-Medical): Patient declined  Physical Activity: Insufficiently Active (12/17/2023)   Exercise Vital Sign    Days of Exercise per Week: 4 days    Minutes of Exercise per Session: 30 min  Stress: No Stress Concern Present (12/17/2023)   [employer] of Occupational Health - Occupational Stress Questionnaire    Feeling of Stress: Only a little  Social Connections: Socially Integrated (12/17/2023)   Social Connection and Isolation Panel    Frequency of Communication with Friends and Family: More than three times a week    Frequency of Social Gatherings with Friends and Family: More than three times a week    Attends Religious Services: More than 4 times per year    Active Member of [employer] or Organizations: Yes    Attends Engineer, Structural: More than 4 times per year    Marital Status: Married  Catering Manager Violence: Not At Risk (09/19/2022)   Humiliation, Afraid, Rape, and Kick questionnaire    Fear of Current or Ex-Partner: No    Emotionally Abused: No    Physically Abused: No    Sexually Abused: No    Family History  Problem Relation Age of Onset   Arthritis Mother    Diabetes Mother    Hypertension Mother    Pulmonary embolism Mother        Cardiac arrest    Diverticulitis Mother    Heart disease Father    Heart attack Father    Mesothelioma Father     Hyperlipidemia Maternal Grandmother    Breast cancer Maternal Grandmother    Colitis Paternal Aunt        in 23's   Stomach cancer Paternal Aunt    Heart attack Paternal Aunt    Heart attack Paternal Uncle    Stroke Paternal Aunt    Breast cancer Maternal Aunt    Colon  polyps Sister 71   Diverticulitis Sister    Colon cancer Neg Hx    Esophageal cancer Neg Hx    Rectal cancer Neg Hx     Current Outpatient Medications on File Prior to Visit  Medication Sig Dispense Refill   albuterol  (VENTOLIN  HFA) 108 (90 Base) MCG/ACT inhaler Inhale 1-2 puffs into the lungs every 6 (six) hours as needed for wheezing or shortness of breath. 18 g 1   amLODipine  (NORVASC ) 10 MG tablet Take 1 tablet (10 mg total) by mouth daily. 90 tablet 3   cyclobenzaprine  (FLEXERIL ) 10 MG tablet Take 1 tablet (10 mg total) by mouth at bedtime. 20 tablet 0   glipiZIDE  2.5 MG TABS Take 1 tablet by mouth daily. 90 tablet 1   lisinopril  (ZESTRIL ) 2.5 MG tablet Take 1 tablet (2.5 mg total) by mouth daily. 90 tablet 3   Multiple Vitamin (MULTIVITAMIN ADULT PO) Take 1 tablet by mouth daily.     rizatriptan  (MAXALT ) 5 MG tablet Take 1 tablet (5 mg total) by mouth as needed for migraine. May repeat in 2 hours if needed 10 tablet 2   rosuvastatin  (CRESTOR ) 5 MG tablet Take 1 tablet (5 mg total) by mouth daily. 90 tablet 3   No current facility-administered medications on file prior to visit.    Allergies  Allergen Reactions   Cholestatin        Physical Exam Vitals requested from patient and listed below if patient had equipment and was able to obtain at home for this virtual visit: There were no vitals filed for this visit. Estimated body mass index is 32.47 kg/m as calculated from the following:   Height as of 12/05/23: 5' 7.75 (1.721 m).   Weight as of 12/05/23: 212 lb (96.2 kg).  EKG (optional): deferred due to virtual visit  GENERAL: alert, oriented, no acute distress detected, full vision exam deferred due  to pandemic and/or virtual encounter  PSYCH/NEURO: pleasant and cooperative, no obvious depression or anxiety, speech and thought processing grossly intact, Cognitive function grossly intact  Flowsheet Row Clinical Support from 12/17/2023 in [facility] [place]  PHQ-9 Total Score 2        12/17/2023   10:44 AM 12/05/2023   11:43 AM 09/19/2022    1:13 PM 10/02/2021    4:14 PM 04/27/2021    1:35 PM  Depression screen PHQ 2/9  Decreased Interest 0 0 0 0 1  Down, Depressed, Hopeless 0 1 0 0 1  PHQ - 2 Score 0 1 0 0 2  Altered sleeping 1    1  Tired, decreased energy 1    0  Change in appetite 0    0  Feeling bad or failure about yourself  0    0  Trouble concentrating 0    0  Moving slowly or fidgety/restless 0    0  Suicidal thoughts 0    0  PHQ-9 Score 2    3  Difficult doing work/chores     Somewhat difficult       04/12/2021   11:07 AM 10/02/2021    4:14 PM 09/19/2022    1:15 PM 12/17/2023   10:43 AM 12/17/2023   11:10 AM  Fall Risk  Falls in the past year? 0 0 0 1   Was there an injury with Fall? 0 0 0 0   Fall Risk Category Calculator 0 0 0 1   Fall Risk Category (Retired) Low  Low      (  RETIRED) Patient Fall Risk Level Low fall risk  Low fall risk      Patient at Risk for Falls Due to  Medication side effect;Impaired vision No Fall Risks  History of fall(s)  Fall risk Follow up  Falls evaluation completed;Education provided;Falls prevention discussed  Falls prevention discussed Falls evaluation completed Falls evaluation completed;Falls prevention discussed     Data saved with a previous flowsheet row definition     SUMMARY AND PLAN:  Encounter for Medicare annual wellness exam   Discussed applicable health maintenance/preventive health measures and advised and referred or ordered per patient preferences:  Health Maintenance  Topic Date Due   COVID-19 Vaccine (3 - 2025-26 season) 01/02/2024 (Originally 10/14/2023)   HIV Screening  05/10/2024  (Originally [birth year]/1979)   Influenza Vaccine  05/12/2024 (Originally 09/13/2023)   Hepatitis B Vaccines 19-59 Average Risk (1 of 3 - 19+ 3-dose series) 12/16/2024 (Originally 10/11/1983)   Hepatitis C Screening  12/16/2024 (Originally 10/11/1982)   Mammogram  04/15/2024   Diabetic kidney evaluation - eGFR measurement  06/04/2024   Diabetic kidney evaluation - Urine ACR  06/04/2024   FOOT EXAM  06/04/2024   HEMOGLOBIN A1C  06/04/2024   OPHTHALMOLOGY EXAM  07/21/2024   Medicare Annual Wellness (AWV)  12/16/2024   Colonoscopy  07/20/2025   DTaP/Tdap/Td (2 - Td or Tdap) 01/23/2026   Pneumococcal Vaccine: 50+ Years  Completed   Zoster Vaccines- Shingrix   Completed   HPV VACCINES  Aged Out   Meningococcal B Vaccine  Aged Out      Education and counseling on the following was provided based on the above review of health and a plan/checklist for the patient, along with additional information discussed, was provided for the patient in the patient instructions :  -Advised on importance of completing advanced directives, discussed options for completing and provided information in patient instructions as well -Provided counseling and plan for increased risk of falling if applicable per above screening. Reviewed and demonstrated safe balance exercises that can be done at home to improve balance and discussed exercise guidelines for adults with include balance exercises at least 3 days per week.  -Advised and counseled on a healthy lifestyle - including the importance of a healthy diet, regular physical activity, social connections and stress management. -Reviewed patient's current diet. Advised and counseled on a whole foods based healthy diet. A summary of a healthy diet was provided in the Patient Instructions.  -reviewed patient's current physical activity level and discussed exercise guidelines for adults. Discussed resources and ideas for safe exercise at home to assist in meeting exercise guideline  recommendations in a safe and healthy way.  -Advise yearly dental visits at minimum and regular eye exams   Follow up: see patient instructions     There are no Patient Instructions on file for this visit.  Chiquita JONELLE Cramp, DO

## 2023-12-17 NOTE — Patient Instructions (Signed)
   Begin sitting upright in a sturdy chair and have a weight on the floor to your side (gallon of milk will work) Side bend so you can reach the weight on the ground and then use your core to sit up straight while holding the weight in your hand. Repeat.  Perform 2-3 sets of 12-15 repetitions per side

## 2023-12-17 NOTE — Patient Instructions (Signed)
 I really enjoyed getting to talk with you today! I am available on Tuesdays and Thursdays for virtual visits if you have any questions or concerns, or if I can be of any further assistance.   CHECKLIST FROM ANNUAL WELLNESS VISIT:  -Follow up (please call to schedule if not scheduled after visit):   -yearly for annual wellness visit with primary care office  Here is a list of your preventive care/health maintenance measures and the plan for each if any are due:  PLAN For any measures below that may be due:    1. Can get vaccines at the pharmacy - please let us  know if you do so that we can update your record.    2. Can get labs when you are in the office for visit.   Health Maintenance  Topic Date Due   COVID-19 Vaccine (3 - 2025-26 season) 01/02/2024 (Originally 10/14/2023)   HIV Screening  05/10/2024 (Originally 10[birth year]11/1979)   Influenza Vaccine  05/12/2024 (Originally 09/13/2023)   Hepatitis B Vaccines 19-59 Average Risk (1 of 3 - 19+ 3-dose series) 12/16/2024 (Originally 10/11/1983)   Hepatitis C Screening  12/16/2024 (Originally 10/11/1982)   Mammogram  04/15/2024   Diabetic kidney evaluation - eGFR measurement  06/04/2024   Diabetic kidney evaluation - Urine ACR  06/04/2024   FOOT EXAM  06/04/2024   HEMOGLOBIN A1C  06/04/2024   OPHTHALMOLOGY EXAM  07/21/2024   Medicare Annual Wellness (AWV)  12/16/2024   Colonoscopy  07/20/2025   DTaP/Tdap/Td (2 - Td or Tdap) 01/23/2026   Pneumococcal Vaccine: 50+ Years  Completed   Zoster Vaccines- Shingrix   Completed   HPV VACCINES  Aged Out   Meningococcal B Vaccine  Aged Out    -See a dentist at least yearly  -Get your eyes checked and then per your eye specialist's recommendations  -Other issues addressed today:   -I have included below further information regarding a healthy whole foods based diet, physical activity guidelines for adults, stress management and opportunities for social connections. I hope you find this information  useful.   -----------------------------------------------------------------------------------------------------------------------------------------------------------------------------------------------------------------------------------------------------------    NUTRITION: -eat real food: lots of colorful vegetables (half the plate) and fruits -5-7 servings of vegetables and fruits per day (fresh or steamed is best), exp. 2 servings of vegetables with lunch and dinner and 2 servings of fruit per day. Berries and greens such as kale and collards are great choices.  -consume on a regular basis:  fresh fruits, fresh veggies, fish, nuts, seeds, healthy oils (such as olive oil, avocado oil), whole grains (make sure for bread/pasta/crackers/etc., that the first ingredient on label contains the word whole), legumes. -can eat small amounts of dairy and lean meat (no larger than the palm of your hand), but avoid processed meats such as ham, bacon, lunch meat, etc. -drink water -try to avoid fast food and pre-packaged foods, processed meat, ultra processed foods/beverages (donuts, candy, etc.) -most experts advise limiting sodium to < 2300mg  per day, should limit further is any chronic conditions such as high blood pressure, heart disease, diabetes, etc. The American Heart Association advised that < 1500mg  is is ideal -try to avoid foods/beverages that contain any ingredients with names you do not recognize  -try to avoid foods/beverages  with added sugar or sweeteners/sweets  -try to avoid sweet drinks (including diet drinks): soda, juice, Gatorade, sweet tea, power drinks, diet drinks -try to avoid white rice, white bread, pasta (unless whole grain)  EXERCISE GUIDELINES FOR ADULTS: -if you wish to increase your physical  activity, do so gradually and with the approval of your doctor -STOP and seek medical care immediately if you have any chest pain, chest discomfort or trouble breathing when starting  or increasing exercise  -move and stretch your body, legs, feet and arms when sitting for long periods -Physical activity guidelines for optimal health in adults: -get at least 150 minutes per week of moderate exercise (can talk, but not sing); this is about 20-30 minutes of sustained activity 5-7 days per week or two 10-15 minute episodes of sustained activity 5-7 days per week -do some muscle building/resistance training/strength training at least 2 days per week  -balance exercises 3+ days per week:   Stand somewhere where you have something sturdy to hold onto if you lose balance    1) lift up on toes, then back down, start with 5x per day and work up to 20x   2) stand and lift one leg straight out to the side so that foot is a few inches of the floor, start with 5x each side and work up to 20x each side   3) stand on one foot, start with 5 seconds each side and work up to 20 seconds on each side  If you need ideas or help with getting more active:  -Silver sneakers https://tools.silversneakers.com  -Walk with a Doc: Http://www.duncan-williams.com/  -try to include resistance (weight lifting/strength building) and balance exercises twice per week: or the following link for ideas: http://castillo-powell.com/  buyducts.dk  STRESS MANAGEMENT: -can try meditating, or just sitting quietly with deep breathing while intentionally relaxing all parts of your body for 5 minutes daily -if you need further help with stress, anxiety or depression please follow up with your primary doctor or contact the wonderful folks at [facility]: [phone]  SOCIAL CONNECTIONS: -options in [place] if you wish to engage in more social and exercise related activities:  -Silver sneakers https://tools.silversneakers.com  -Walk with a Doc: Http://www.duncan-williams.com/  -Check out the [facility] Active Adults  50+ section on the [place] of [employer] companies (hiking clubs, book clubs, cards and games, chess, exercise classes, aquatic classes and much more) - see the website for details: https://www.[place].gov/departments/parks-recreation/active-adults50  -YouTube has lots of exercise videos for different ages and abilities as well  -[facility] (a variety of indoor and outdoor inperson activities for adults). [phone]. [address]  -Virtual Online Classes (a variety of topics): see seniorplanet.org or call [phone]  -consider volunteering at a school, hospice center, church, senior center or elsewhere          ADVANCED HEALTHCARE DIRECTIVES:  [place] Advanced Directives assistance:   expressweek.com.cy  Everyone should have advanced health care directives in place. This is so that you get the care you want, should you ever be in a situation where you are unable to make your own medical decisions.   From the [place] Advanced Directive Website: Advance Health Care Directives are legal documents in which you give written instructions about your health care if, in the future, you cannot speak for yourself.   A health care power of attorney allows you to name a person you trust to make your health care decisions if you cannot make them yourself. A declaration of a desire for a natural death (or living will) is document, which states that you desire not to have your life prolonged by extraordinary measures if you have a terminal or incurable illness or if you are in a vegetative state. An advance instruction for mental health treatment  makes a declaration of instructions, information and preferences regarding your mental health treatment. It also states that you are aware that the advance instruction authorizes a mental health treatment provider to act according to your wishes. It may also outline your consent or  refusal of mental health treatment. A declaration of an anatomical gift allows anyone over the age of 18 to make a gift by will, organ donor card or other document.   Please see the following website or an elder law attorney for forms, FAQs and for completion of advanced directives: [place]  Print Production Planner Health Care Directives Advance Health Care Directives (http://guzman.com/)  Or copy and paste the following to your web browser: Poshchat.fi

## 2023-12-24 ENCOUNTER — Ambulatory Visit: Admitting: Physical Therapy

## 2023-12-24 NOTE — Therapy (Incomplete)
 OUTPATIENT PHYSICAL THERAPY THORACOLUMBAR TREATMENT - DISCHARGE NOTE VS RECERT***   Patient Name: JAMAIYAH PYLE MRN: [id number] DOB:31-May-1965, 58 y.o., female Today's Date: 12/24/2023   PHYSICAL THERAPY DISCHARGE SUMMARY  Visits from Start of Care: ***  Current functional level related to goals / functional outcomes: ***   Remaining deficits: ***   Education / Equipment: ***   Patient agrees to discharge. Patient goals were {OP Goals:25702::met}. 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   Asthma 2002   uses inhaler as PRN   Depression    as a child, and stress recently   Diabetes mellitus without complication (HCC)    on meds   Fibrocystic breast 2005   Fibromyalgia    on meds   GERD (gastroesophageal reflux disease)    hx of   Hypertension    on meds   LGSIL (low grade squamous intraepithelial lesion) on Pap smear 2007   Libido, decreased 2010   Migraines    migraines - otc med prn, then maxalt  if needed   Neuromuscular disorder (HCC)    Ovarian cyst    Stargardt's disease 1986   Patient is legally blind   Tachycardia    per patient diagnosed in ER 2014, no problems currently   Trichomonas    Vaginosis 2008   Vitamin D  deficiency    on meds   Past Surgical History:  Procedure Laterality Date   BREAST EXCISIONAL BIOPSY Left    benign   BREAST SURGERY  1986   biopsy - benign   COLONOSCOPY  2016   JMP-MAC-suprep(good)-[facility]   CYSTOSCOPY  12/26/2012   Procedure: CYSTOSCOPY;  Surgeon: Rome Rigg, MD;  Location: [facility];  Service: Gynecology;;   DILATION AND CURETTAGE OF UTERUS     ganglion cyst removed from wrist     left   LAPAROSCOPIC ASSISTED VAGINAL HYSTERECTOMY N/A 12/26/2012   Procedure:  LAPAROSCOPIC ASSISTED VAGINAL HYSTERECTOMY Uterine Morcellation, Bilateral Salpingectomy, ;  Surgeon: Rome Rigg, MD;  Location: [facility];  Service: Gynecology;  Laterality: N/A;   MYOMECTOMY     In [place]  - laparotomy   OOPHORECTOMY  2000   laparotomy in [place]    SHOULDER ARTHROSCOPY Left 2021   SHOULDER ARTHROSCOPY W/ ROTATOR CUFF REPAIR Right 2006   TONSILLECTOMY AND ADENOIDECTOMY     TUBAL LIGATION  2000   WISDOM TOOTH EXTRACTION     x 2 teeth   WISDOM TOOTH EXTRACTION     Patient Active Problem List   Diagnosis Date Noted   Vitamin D  deficiency 08/06/2018   Vitamin B 12 deficiency 08/06/2018   Iron deficiency anemia 08/06/2018   Type 2 diabetes mellitus with hyperglycemia (HCC) 09/22/2015   Asthma, chronic 05/11/2014   Legal blindness 05/11/2014   Chronic back pain 05/11/2014   Migraines 05/11/2014   Stargardt's disease    Menorrhagia 09/11/2011    PCP: Merna Huxley, NP  REFERRING PROVIDER: Faye Lauraine PARAS, FNP  REFERRING DIAG: M54.50 (ICD-10-CM) - Low back pain, unspecified  Rationale for Evaluation and Treatment: Rehabilitation  THERAPY DIAG:  No diagnosis found.  ONSET DATE: 10/22/2023 (referral date)  SUBJECTIVE:  SUBJECTIVE STATEMENT: Tayva  Pt reports her back is a bit better today. HEP is going okay. Got a large exercise ball but thinks she needs a new one, hers deflates too quickly. No falls.   ***    PERTINENT HISTORY:  PMH: fibromyalgia, B shoulder surgery, chronic LBP, anemia, DM, asthma, migraines, Stargardt's disease (legally blind), GERD, HTN  PAIN:  Are you having pain? Yes: NPRS scale: 7/10 Pain location: lower back mainly on L side Pain description: achy, can be sharp at times Aggravating factors: standing for too long, being on her  feet Relieving factors: ice or heat, tries not to take pain medication but will take ibuprofen  or Tylenlol if it gets too bad; mm relaxers make her sleepy  PRECAUTIONS: Fall, legally blind (loss of peripheral vision)  RED FLAGS: None   WEIGHT BEARING RESTRICTIONS: No  FALLS:  Has patient fallen in last 6 months? Yes. 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  DIAGNOSTIC FINDINGS:  Per referring provider note: 2V lumbar AP and lateral, done in our office today and reviewed by me. Reviewed with the patient. Normal alignment. Lumbar lordosis is maintained. Lumbar disc space is maintained. Lumbar facet arthrosis is the biggest finding on imaging today, noting this at L4-5 and L5-S1. No compression fractures noted.  PATIENT SURVEYS:  Oswestry: 25/50, severe disability  COGNITION: Overall cognitive status: Within functional limits for tasks assessed     SENSATION: WFL per pt report  Ankles swell occasionally  MUSCLE LENGTH: Hamstrings: tight HS  POSTURE: rounded shoulders, forward head, and posterior pelvic tilt  PALPATION: Palpable muscle tightness in lumbar  paraspinals and B QL (L>R) down into upper glutes with more tenderness on L side as compared to R side  LUMBAR ROM:   AROM eval  Flexion WFL, tight HS  Extension Increase in pain  Right lateral flexion Pain on R side  Left lateral flexion Pain on L side  Right rotation Pain on L side  Left rotation WFL   (Blank rows = not tested)  LOWER EXTREMITY ROM:   WFL  Active  Right eval Left eval  Hip flexion    Hip extension    Hip abduction    Hip adduction    Hip internal rotation    Hip external rotation    Knee flexion    Knee extension    Ankle dorsiflexion    Ankle plantarflexion    Ankle inversion    Ankle eversion     (Blank rows = not tested)  LOWER EXTREMITY MMT:    MMT Right eval Left eval  Hip flexion 5 4+  Hip extension    Hip abduction  Hip adduction    Hip internal rotation    Hip external rotation    Knee flexion 5 5  Knee extension 5 5  Ankle dorsiflexion 5 5  Ankle plantarflexion    Ankle inversion    Ankle eversion     (Blank rows = not tested)  LUMBAR SPECIAL TESTS:  None assessed at eval  FUNCTIONAL TESTS:  None assessed at eval  GAIT: Distance walked: various clinic distances Assistive device utilized: None Level of assistance: Complete Independence Comments: mildly antalgic with decreased gait speed  TREATMENT DATE:         Ther Act  SciFit multi-peaks level 5.0 for 8 minutes using BUE/BLEs for neural priming for reciprocal movement, dynamic cardiovascular warmup and improved shoulder mobility.  The following were performed for improved spinal mobility, core stability and pain modulation: Quadruped cat cow, x15 reps. Min cues for proper technique. Decreased mobility w/cat vs cow. Added to HEP (see bolded below)  Child's pose in fwd and lateral positions, x90s per side. Added to HEP (see bolded below)  Seated oblique crunches w/12# KB, x15 reps per side. Mod cues to avoid shoulder shrug throughout. Added to HEP Seated alt march  w/12# KB goblet hold, 3x8 reps per side.  Pt rated pain as 5/10 following session.  ***    PATIENT EDUCATION:  Education details: Updates to HEP, think about DC vs recert for next session   Person educated: Patient Education method: Explanation, Demonstration, Tactile cues, Verbal cues, and Handouts Education comprehension: verbalized understanding, returned demonstration, and needs further education  HOME EXERCISE PROGRAM: Access Code: [email or web address] URL: [email or web address] Date: 11/19/2023 Prepared by: Waddell Southgate  Exercises - Hooklying Single Knee to Chest Stretch  - 1 x daily - 7 x weekly - 1 sets - 3-5 reps - 30 sec hold - Supine Piriformis Stretch with Leg Straight  - 1 x daily - 7 x weekly - 1 sets - 3-5 reps - 30 sec hold - Supine Lower Trunk Rotation  - 1 x daily - 7 x weekly - 3 sets - 10 reps - 10-15 sec hold - Supine Posterior Pelvic Tilt  - 1 x daily - 7 x weekly - 3 sets - 10 reps - 5 sec hold - Supine Bridge  - 1 x daily - 7 x weekly - 3 sets - 10 reps - 5 sec hold - Supine 90/90 Alternating Toe Touch  - 1 x daily - 7 x weekly - 3 sets - 5 reps - Clamshell with Resistance  - 1 x daily - 7 x weekly - 3 sets - 10 reps - Cat Cow  - 1 x daily - 7 x weekly - 3 sets - 10 reps - Child's Pose with Sidebending  - 1 x daily - 7 x weekly - 1-2 minutes hold  ASSESSMENT:  CLINICAL IMPRESSION: Emphasis of skilled PT session on improved spinal mobility, core stability and pain modulation. Pt initially reporting 7/10 low back pain that improved to 5/10 by end of session. Pt most challenged by oblique crunches, frequently performing shoulder shrug compensation due to core weakness. Pt unsure if she wants to add more appointments at end of POC today, so will inform primary therapist next session. Continue POC.    OBJECTIVE IMPAIRMENTS: decreased activity tolerance, decreased endurance, difficulty walking, decreased strength, impaired perceived functional ability,  improper body mechanics, postural dysfunction, and pain.   ACTIVITY LIMITATIONS: carrying, lifting, bending, sitting, standing, squatting, and bed mobility  PARTICIPATION LIMITATIONS: meal prep, cleaning, laundry,  community activity, and occupation  PERSONAL FACTORS: Fitness, Past/current experiences, Time since onset of injury/illness/exacerbation, and 3+ comorbidities:   fibromyalgia, B shoulder surgery, chronic LBP, anemia, DM, asthma, migraines, Stargardt's disease (legally blind), GERD, HTNare also affecting patient's functional outcome.   REHAB POTENTIAL: Good  CLINICAL DECISION MAKING: Stable/uncomplicated  EVALUATION COMPLEXITY: Low   GOALS: Goals reviewed with patient? Yes  SHORT TERM GOALS: Target date: 12/10/2023   Pt will be independent with initial HEP for improved function and increased independence with management of her pain symptoms. Baseline: Goal status: MET    LONG TERM GOALS: Target date: 12/31/2023***   Pt will be independent with final HEP for improved function and increased independence with management of her pain symptoms. Baseline:  Goal status: INITIAL  2.  Pt will improve her score on the Oswestry to </= 20/50 for improved function and decreased disability level. Baseline: 25/50 (10/7) Goal status: INITIAL  3.  Pt will report pain </= 6/10 at the most during her normal daily routine. Baseline: 8/10 (10/7) Goal status: INITIAL  4.  Pt will tolerate standing >/= 30 min with no increase in her pain levels for improved function. Baseline: limited to 30 min (10/7) Goal status: INITIAL    PLAN:  PT FREQUENCY: 1x/week  PT DURATION: 6 weeks  PLANNED INTERVENTIONS: 02835- PT Re-evaluation, 97750- Physical Performance Testing, 97110-Therapeutic exercises, 97530- Therapeutic activity, [id number]- Neuromuscular re-education, 97535- Self Care, 02859- Manual therapy, [id number]- Gait training, [id number]- Aquatic Therapy, [phone]- Electrical stimulation (manual),  [phone] (1-2 muscles), [id number] (3+ muscles)- Dry Needling, Patient/Family education, Balance training, Stair training, Taping, Joint mobilization, Spinal mobilization, Cryotherapy, and Moist heat.  PLAN FOR NEXT SESSION: Pt unsure if she wanted to DC or recert and add aquatic appointments on 11/4. Goals and Recert vs DC.   how is HEP? Add to HEP PRN for core strengthening and stabilization, seated QL stretch, dead bugs, palloff press, seated on swiss ball, work on safe lifting techniques; also interested in addressing her knee/LE pain and potentially interested in aquatic therapy***    Waddell Southgate, PT Waddell Southgate, PT, DPT, CSRS   12/24/2023, 7:31 AM
